# Patient Record
Sex: Male | Born: 1945 | Race: Black or African American | Hispanic: No | Marital: Single | Smoking: Former smoker
Health system: Southern US, Community
[De-identification: ages and names within clinical notes are randomized; demographics above are authoritative.]

## PROBLEM LIST (undated history)

## (undated) DIAGNOSIS — Z720 Tobacco use: Secondary | ICD-10-CM

## (undated) DIAGNOSIS — C679 Malignant neoplasm of bladder, unspecified: Secondary | ICD-10-CM

## (undated) DIAGNOSIS — E119 Type 2 diabetes mellitus without complications: Secondary | ICD-10-CM

---

## 2019-05-16 ENCOUNTER — Encounter (HOSPITAL_COMMUNITY): Payer: Self-pay | Admitting: Emergency Medicine

## 2019-05-16 ENCOUNTER — Inpatient Hospital Stay (HOSPITAL_COMMUNITY)
Admission: EM | Admit: 2019-05-16 | Discharge: 2019-05-20 | DRG: 065 | Disposition: A | Discharge status: Rehabilitation Facility | Payer: No Typology Code available for payment source | Attending: Internal Medicine | Admitting: Internal Medicine

## 2019-05-16 ENCOUNTER — Other Ambulatory Visit: Payer: Self-pay

## 2019-05-16 ENCOUNTER — Emergency Department (HOSPITAL_COMMUNITY): Payer: No Typology Code available for payment source

## 2019-05-16 DIAGNOSIS — I63412 Cerebral infarction due to embolism of left middle cerebral artery: Principal | ICD-10-CM | POA: Diagnosis present

## 2019-05-16 DIAGNOSIS — R29706 NIHSS score 6: Secondary | ICD-10-CM | POA: Diagnosis present

## 2019-05-16 DIAGNOSIS — K5900 Constipation, unspecified: Secondary | ICD-10-CM | POA: Diagnosis not present

## 2019-05-16 DIAGNOSIS — Z720 Tobacco use: Secondary | ICD-10-CM | POA: Diagnosis present

## 2019-05-16 DIAGNOSIS — F1010 Alcohol abuse, uncomplicated: Secondary | ICD-10-CM | POA: Diagnosis present

## 2019-05-16 DIAGNOSIS — R531 Weakness: Secondary | ICD-10-CM | POA: Diagnosis not present

## 2019-05-16 DIAGNOSIS — F1210 Cannabis abuse, uncomplicated: Secondary | ICD-10-CM | POA: Diagnosis present

## 2019-05-16 DIAGNOSIS — E041 Nontoxic single thyroid nodule: Secondary | ICD-10-CM

## 2019-05-16 DIAGNOSIS — Z20822 Contact with and (suspected) exposure to covid-19: Secondary | ICD-10-CM | POA: Diagnosis present

## 2019-05-16 DIAGNOSIS — R471 Dysarthria and anarthria: Secondary | ICD-10-CM | POA: Diagnosis present

## 2019-05-16 DIAGNOSIS — Z833 Family history of diabetes mellitus: Secondary | ICD-10-CM

## 2019-05-16 DIAGNOSIS — Z906 Acquired absence of other parts of urinary tract: Secondary | ICD-10-CM

## 2019-05-16 DIAGNOSIS — E875 Hyperkalemia: Secondary | ICD-10-CM | POA: Diagnosis present

## 2019-05-16 DIAGNOSIS — E119 Type 2 diabetes mellitus without complications: Secondary | ICD-10-CM

## 2019-05-16 DIAGNOSIS — R29810 Facial weakness: Secondary | ICD-10-CM | POA: Diagnosis present

## 2019-05-16 DIAGNOSIS — R4701 Aphasia: Secondary | ICD-10-CM | POA: Diagnosis present

## 2019-05-16 DIAGNOSIS — Z87891 Personal history of nicotine dependence: Secondary | ICD-10-CM

## 2019-05-16 DIAGNOSIS — C679 Malignant neoplasm of bladder, unspecified: Secondary | ICD-10-CM | POA: Diagnosis present

## 2019-05-16 DIAGNOSIS — G8191 Hemiplegia, unspecified affecting right dominant side: Secondary | ICD-10-CM

## 2019-05-16 DIAGNOSIS — R3915 Urgency of urination: Secondary | ICD-10-CM | POA: Diagnosis not present

## 2019-05-16 DIAGNOSIS — I639 Cerebral infarction, unspecified: Secondary | ICD-10-CM

## 2019-05-16 DIAGNOSIS — E785 Hyperlipidemia, unspecified: Secondary | ICD-10-CM

## 2019-05-16 DIAGNOSIS — Z9114 Patient's other noncompliance with medication regimen: Secondary | ICD-10-CM

## 2019-05-16 DIAGNOSIS — Z8551 Personal history of malignant neoplasm of bladder: Secondary | ICD-10-CM

## 2019-05-16 DIAGNOSIS — I10 Essential (primary) hypertension: Secondary | ICD-10-CM | POA: Diagnosis present

## 2019-05-16 HISTORY — DX: Type 2 diabetes mellitus without complications: E11.9

## 2019-05-16 HISTORY — DX: Tobacco use: Z72.0

## 2019-05-16 HISTORY — DX: Cerebral infarction, unspecified: I63.9

## 2019-05-16 HISTORY — DX: Malignant neoplasm of bladder, unspecified: C67.9

## 2019-05-16 NOTE — ED Triage Notes (Signed)
Pt BIB [facility] from home c/o stroke like symptoms.   LKN: unknown. Pt states waking up Sunday morning with these symptoms.   Pt. Presents A&Ox2 to self and place. EMS reports slurred speech, R side droop/weakness.   EMS VS CBG 125 HR 78 NSR BP 150/65 SpO2 99 RA RR 18

## 2019-05-17 ENCOUNTER — Emergency Department (HOSPITAL_COMMUNITY): Payer: No Typology Code available for payment source

## 2019-05-17 ENCOUNTER — Inpatient Hospital Stay (HOSPITAL_COMMUNITY): Payer: No Typology Code available for payment source

## 2019-05-17 ENCOUNTER — Observation Stay (HOSPITAL_COMMUNITY): Payer: No Typology Code available for payment source

## 2019-05-17 ENCOUNTER — Encounter (HOSPITAL_COMMUNITY): Payer: Self-pay | Admitting: Family Medicine

## 2019-05-17 DIAGNOSIS — Z20822 Contact with and (suspected) exposure to covid-19: Secondary | ICD-10-CM | POA: Diagnosis present

## 2019-05-17 DIAGNOSIS — F1010 Alcohol abuse, uncomplicated: Secondary | ICD-10-CM | POA: Diagnosis present

## 2019-05-17 DIAGNOSIS — Z87891 Personal history of nicotine dependence: Secondary | ICD-10-CM | POA: Diagnosis not present

## 2019-05-17 DIAGNOSIS — I10 Essential (primary) hypertension: Secondary | ICD-10-CM | POA: Diagnosis present

## 2019-05-17 DIAGNOSIS — Z720 Tobacco use: Secondary | ICD-10-CM | POA: Diagnosis present

## 2019-05-17 DIAGNOSIS — F1210 Cannabis abuse, uncomplicated: Secondary | ICD-10-CM | POA: Diagnosis present

## 2019-05-17 DIAGNOSIS — C679 Malignant neoplasm of bladder, unspecified: Secondary | ICD-10-CM | POA: Diagnosis not present

## 2019-05-17 DIAGNOSIS — E119 Type 2 diabetes mellitus without complications: Secondary | ICD-10-CM | POA: Diagnosis present

## 2019-05-17 DIAGNOSIS — Z8551 Personal history of malignant neoplasm of bladder: Secondary | ICD-10-CM | POA: Diagnosis not present

## 2019-05-17 DIAGNOSIS — I639 Cerebral infarction, unspecified: Secondary | ICD-10-CM | POA: Diagnosis not present

## 2019-05-17 DIAGNOSIS — I6389 Other cerebral infarction: Secondary | ICD-10-CM | POA: Diagnosis not present

## 2019-05-17 DIAGNOSIS — Z906 Acquired absence of other parts of urinary tract: Secondary | ICD-10-CM | POA: Diagnosis not present

## 2019-05-17 DIAGNOSIS — G8191 Hemiplegia, unspecified affecting right dominant side: Secondary | ICD-10-CM | POA: Diagnosis present

## 2019-05-17 DIAGNOSIS — R531 Weakness: Secondary | ICD-10-CM | POA: Diagnosis present

## 2019-05-17 DIAGNOSIS — I69351 Hemiplegia and hemiparesis following cerebral infarction affecting right dominant side: Secondary | ICD-10-CM | POA: Diagnosis not present

## 2019-05-17 DIAGNOSIS — I69320 Aphasia following cerebral infarction: Secondary | ICD-10-CM | POA: Diagnosis not present

## 2019-05-17 DIAGNOSIS — I63412 Cerebral infarction due to embolism of left middle cerebral artery: Secondary | ICD-10-CM | POA: Diagnosis present

## 2019-05-17 DIAGNOSIS — Z833 Family history of diabetes mellitus: Secondary | ICD-10-CM | POA: Diagnosis not present

## 2019-05-17 DIAGNOSIS — R29706 NIHSS score 6: Secondary | ICD-10-CM | POA: Diagnosis present

## 2019-05-17 DIAGNOSIS — E875 Hyperkalemia: Secondary | ICD-10-CM | POA: Diagnosis present

## 2019-05-17 DIAGNOSIS — R471 Dysarthria and anarthria: Secondary | ICD-10-CM | POA: Diagnosis present

## 2019-05-17 DIAGNOSIS — I69392 Facial weakness following cerebral infarction: Secondary | ICD-10-CM | POA: Diagnosis not present

## 2019-05-17 DIAGNOSIS — R29810 Facial weakness: Secondary | ICD-10-CM | POA: Diagnosis present

## 2019-05-17 DIAGNOSIS — Z9114 Patient's other noncompliance with medication regimen: Secondary | ICD-10-CM | POA: Diagnosis not present

## 2019-05-17 DIAGNOSIS — R001 Bradycardia, unspecified: Secondary | ICD-10-CM | POA: Diagnosis not present

## 2019-05-17 DIAGNOSIS — I69322 Dysarthria following cerebral infarction: Secondary | ICD-10-CM | POA: Diagnosis not present

## 2019-05-17 DIAGNOSIS — I6522 Occlusion and stenosis of left carotid artery: Secondary | ICD-10-CM | POA: Diagnosis not present

## 2019-05-17 DIAGNOSIS — Z23 Encounter for immunization: Secondary | ICD-10-CM | POA: Diagnosis not present

## 2019-05-17 DIAGNOSIS — R3915 Urgency of urination: Secondary | ICD-10-CM | POA: Diagnosis not present

## 2019-05-17 DIAGNOSIS — E041 Nontoxic single thyroid nodule: Secondary | ICD-10-CM | POA: Diagnosis present

## 2019-05-17 DIAGNOSIS — E785 Hyperlipidemia, unspecified: Secondary | ICD-10-CM | POA: Diagnosis present

## 2019-05-17 DIAGNOSIS — S20212D Contusion of left front wall of thorax, subsequent encounter: Secondary | ICD-10-CM | POA: Diagnosis not present

## 2019-05-17 DIAGNOSIS — I63512 Cerebral infarction due to unspecified occlusion or stenosis of left middle cerebral artery: Secondary | ICD-10-CM | POA: Diagnosis not present

## 2019-05-17 DIAGNOSIS — R4701 Aphasia: Secondary | ICD-10-CM | POA: Diagnosis present

## 2019-05-17 DIAGNOSIS — K5900 Constipation, unspecified: Secondary | ICD-10-CM | POA: Diagnosis not present

## 2019-05-17 LAB — COMPREHENSIVE METABOLIC PANEL
ALT: 13 U/L (ref 0–44)
AST: 19 U/L (ref 15–41)
Albumin: 3 g/dL — ABNORMAL LOW (ref 3.5–5.0)
Alkaline Phosphatase: 102 U/L (ref 38–126)
Anion gap: 12 (ref 5–15)
BUN: 18 mg/dL (ref 8–23)
CO2: 26 mmol/L (ref 22–32)
Calcium: 8.8 mg/dL — ABNORMAL LOW (ref 8.9–10.3)
Chloride: 101 mmol/L (ref 98–111)
Creatinine, Ser: 1.25 mg/dL — ABNORMAL HIGH (ref 0.61–1.24)
GFR calc Af Amer: >60 mL/min (ref >60)
GFR calc non Af Amer: 57 mL/min — ABNORMAL LOW (ref >60)
Glucose, Bld: 92 mg/dL (ref 70–99)
Potassium: 3.3 mmol/L — ABNORMAL LOW (ref 3.5–5.1)
Sodium: 139 mmol/L (ref 135–145)
Total Bilirubin: 1.5 mg/dL — ABNORMAL HIGH (ref 0.3–1.2)
Total Protein: 6.2 g/dL — ABNORMAL LOW (ref 6.5–8.1)

## 2019-05-17 LAB — I-STAT CHEM 8, ED
BUN: 31 mg/dL — ABNORMAL HIGH (ref 8–23)
Calcium, Ion: 1.03 mmol/L — ABNORMAL LOW (ref 1.15–1.40)
Chloride: 102 mmol/L (ref 98–111)
Creatinine, Ser: 1.2 mg/dL (ref 0.61–1.24)
Glucose, Bld: 90 mg/dL (ref 70–99)
HCT: 47 % (ref 39.0–52.0)
Hemoglobin: 16 g/dL (ref 13.0–17.0)
Potassium: 6 mmol/L — ABNORMAL HIGH (ref 3.5–5.1)
Sodium: 135 mmol/L (ref 135–145)
TCO2: 29 mmol/L (ref 22–32)

## 2019-05-17 LAB — GLUCOSE, CAPILLARY
Glucose-Capillary: 83 mg/dL (ref 70–99)
Glucose-Capillary: 70 mg/dL (ref 70–99)
Glucose-Capillary: 110 mg/dL — ABNORMAL HIGH (ref 70–99)

## 2019-05-17 LAB — HEMOGLOBIN A1C
Hgb A1c MFr Bld: 5.4 % (ref 4.8–5.6)
Mean Plasma Glucose: 108.28 mg/dL

## 2019-05-17 LAB — NA AND K (SODIUM & POTASSIUM), RAND UR
Potassium Urine: 36 mmol/L
Sodium, Ur: 55 mmol/L

## 2019-05-17 LAB — CBC
HCT: 46.8 % (ref 39.0–52.0)
Hemoglobin: 15 g/dL (ref 13.0–17.0)
MCH: 27.4 pg (ref 26.0–34.0)
MCHC: 32.1 g/dL (ref 30.0–36.0)
MCV: 85.4 fL (ref 80.0–100.0)
Platelets: 251 10*3/uL (ref 150–400)
RBC: 5.48 MIL/uL (ref 4.22–5.81)
RDW: 14.2 % (ref 11.5–15.5)
WBC: 13.7 10*3/uL — ABNORMAL HIGH (ref 4.0–10.5)
nRBC: 0 % (ref 0.0–0.2)

## 2019-05-17 LAB — SARS CORONAVIRUS 2 (TAT 6-24 HRS): SARS Coronavirus 2: NEGATIVE

## 2019-05-17 LAB — PROTIME-INR
INR: 1.1 (ref 0.8–1.2)
Prothrombin Time: 13.6 seconds (ref 11.4–15.2)

## 2019-05-17 LAB — URINALYSIS, ROUTINE W REFLEX MICROSCOPIC
Bilirubin Urine: NEGATIVE
Glucose, UA: NEGATIVE mg/dL
Hgb urine dipstick: NEGATIVE
Ketones, ur: 5 mg/dL — AB
Leukocytes,Ua: NEGATIVE
Nitrite: NEGATIVE
Protein, ur: NEGATIVE mg/dL
Specific Gravity, Urine: 1.025 (ref 1.005–1.030)
pH: 5 (ref 5.0–8.0)

## 2019-05-17 LAB — DIFFERENTIAL
Abs Immature Granulocytes: 0.03 10*3/uL (ref 0.00–0.07)
Basophils Absolute: 0.1 10*3/uL (ref 0.0–0.1)
Basophils Relative: 0 %
Eosinophils Absolute: 0.2 10*3/uL (ref 0.0–0.5)
Eosinophils Relative: 1 %
Immature Granulocytes: 0 %
Lymphocytes Relative: 23 %
Lymphs Abs: 3.1 10*3/uL (ref 0.7–4.0)
Monocytes Absolute: 1 10*3/uL (ref 0.1–1.0)
Monocytes Relative: 7 %
Neutro Abs: 9.3 10*3/uL — ABNORMAL HIGH (ref 1.7–7.7)
Neutrophils Relative %: 69 %

## 2019-05-17 LAB — RAPID URINE DRUG SCREEN, HOSP PERFORMED
Amphetamines: NOT DETECTED
Barbiturates: NOT DETECTED
Benzodiazepines: NOT DETECTED
Cocaine: NOT DETECTED
Opiates: NOT DETECTED
Tetrahydrocannabinol: POSITIVE — AB

## 2019-05-17 LAB — TROPONIN I (HIGH SENSITIVITY)
Troponin I (High Sensitivity): 4 ng/L (ref <18)
Troponin I (High Sensitivity): 5 ng/L (ref <18)

## 2019-05-17 LAB — POTASSIUM
Potassium: 3.5 mmol/L (ref 3.5–5.1)
Potassium: 3.5 mmol/L (ref 3.5–5.1)

## 2019-05-17 LAB — LIPID PANEL
Cholesterol: 136 mg/dL (ref 0–200)
HDL: 29 mg/dL — ABNORMAL LOW (ref >40)
LDL Cholesterol: 85 mg/dL (ref 0–99)
Total CHOL/HDL Ratio: 4.7 RATIO
Triglycerides: 110 mg/dL (ref <150)
VLDL: 22 mg/dL (ref 0–40)

## 2019-05-17 LAB — ECHOCARDIOGRAM COMPLETE
Height: 75 in
Weight: 2592 oz

## 2019-05-17 LAB — ETHANOL: Alcohol, Ethyl (B): <10 mg/dL (ref <10)

## 2019-05-17 LAB — APTT: aPTT: 23 seconds — ABNORMAL LOW (ref 24–36)

## 2019-05-17 MED ORDER — ACETAMINOPHEN 650 MG RE SUPP: 650.0000 mg | RECTAL | Status: DC | PRN

## 2019-05-17 MED ORDER — INSULIN ASPART 100 UNIT/ML ~~LOC~~ SOLN: 0.0000 [IU] | Freq: Every day | SUBCUTANEOUS | Status: DC

## 2019-05-17 MED ORDER — ASPIRIN 325 MG PO TABS
325.0000 mg | ORAL_TABLET | Freq: Every day | ORAL | Status: DC
  Administered 2019-05-17: 325 mg via ORAL
  Filled 2019-05-17: qty 1

## 2019-05-17 MED ORDER — GADOBUTROL 1 MMOL/ML IV SOLN
7.2000 mL | Freq: Once | INTRAVENOUS | Status: AC | PRN
  Administered 2019-05-17: 7.2 mL via INTRAVENOUS

## 2019-05-17 MED ORDER — ASPIRIN EC 325 MG PO TBEC
325.0000 mg | DELAYED_RELEASE_TABLET | Freq: Every day | ORAL | Status: DC
  Administered 2019-05-18 – 2019-05-20 (×3): 325 mg via ORAL
  Filled 2019-05-17 (×3): qty 1

## 2019-05-17 MED ORDER — SODIUM CHLORIDE 0.9 % IV SOLN: INTRAVENOUS | Status: DC

## 2019-05-17 MED ORDER — ACETAMINOPHEN 160 MG/5ML PO SOLN: 650.0000 mg | ORAL | Status: DC | PRN

## 2019-05-17 MED ORDER — IOHEXOL 350 MG/ML SOLN
75.0000 mL | Freq: Once | INTRAVENOUS | Status: AC | PRN
  Administered 2019-05-17: 75 mL via INTRAVENOUS

## 2019-05-17 MED ORDER — ENOXAPARIN SODIUM 40 MG/0.4ML ~~LOC~~ SOLN
40.0000 mg | Freq: Every day | SUBCUTANEOUS | Status: DC
  Administered 2019-05-17 – 2019-05-20 (×4): 40 mg via SUBCUTANEOUS
  Filled 2019-05-17 (×4): qty 0.4

## 2019-05-17 MED ORDER — CLOPIDOGREL BISULFATE 75 MG PO TABS
75.0000 mg | ORAL_TABLET | Freq: Every day | ORAL | Status: DC
  Administered 2019-05-17 – 2019-05-20 (×4): 75 mg via ORAL
  Filled 2019-05-17 (×4): qty 1

## 2019-05-17 MED ORDER — SENNOSIDES-DOCUSATE SODIUM 8.6-50 MG PO TABS: 1.0000 | ORAL_TABLET | Freq: Every evening | ORAL | Status: DC | PRN

## 2019-05-17 MED ORDER — ENSURE ENLIVE PO LIQD
237.0000 mL | Freq: Two times a day (BID) | ORAL | Status: DC
  Administered 2019-05-17 – 2019-05-20 (×4): 237 mL via ORAL

## 2019-05-17 MED ORDER — ATORVASTATIN CALCIUM 80 MG PO TABS: 80.0000 mg | ORAL_TABLET | Freq: Every day | ORAL | Status: DC

## 2019-05-17 MED ORDER — STROKE: EARLY STAGES OF RECOVERY BOOK
Freq: Once | Status: AC
  Filled 2019-05-17: qty 1

## 2019-05-17 MED ORDER — SODIUM CHLORIDE 0.9 % IV BOLUS
500.0000 mL | Freq: Once | INTRAVENOUS | Status: AC
  Administered 2019-05-17: 500 mL via INTRAVENOUS

## 2019-05-17 MED ORDER — INSULIN ASPART 100 UNIT/ML ~~LOC~~ SOLN: 0.0000 [IU] | Freq: Three times a day (TID) | SUBCUTANEOUS | Status: DC

## 2019-05-17 MED ORDER — ENOXAPARIN SODIUM 40 MG/0.4ML ~~LOC~~ SOLN: 40.0000 mg | Freq: Every day | SUBCUTANEOUS | Status: DC

## 2019-05-17 MED ORDER — ACETAMINOPHEN 325 MG PO TABS
650.0000 mg | ORAL_TABLET | ORAL | Status: DC | PRN
  Filled 2019-05-17: qty 2

## 2019-05-17 MED ORDER — ATORVASTATIN CALCIUM 40 MG PO TABS
40.0000 mg | ORAL_TABLET | Freq: Every day | ORAL | Status: DC
  Administered 2019-05-17 – 2019-05-19 (×3): 40 mg via ORAL
  Filled 2019-05-17 (×3): qty 1

## 2019-05-17 MED ORDER — ASPIRIN 300 MG RE SUPP
300.0000 mg | Freq: Every day | RECTAL | Status: DC
  Filled 2019-05-17: qty 1

## 2019-05-17 NOTE — ED Notes (Signed)
Tele Tatsuya Marana daughter [id number] looking for an update on the patient

## 2019-05-17 NOTE — ED Provider Notes (Signed)
Emergency Department Provider Note   I have reviewed the triage vital signs and the nursing notes.   HISTORY  Chief Complaint No chief complaint on file.   HPI Ruben Moore is a 74 y.o. male with unknown past medical history who presents to the emergency department today with neurologic change.  Patient states that he was last normal as far as walking and using his arms Sunday morning.  He stated sometime since that time he lost use of his right arm and began having difficulty speaking and moving around the house.  It took him over 30 hours to finally get to somewhere where he could call for help.  EMS arrived he had a obvious right facial droop and right arm weakness with dysarthria.  Secondary to be in greater than 24 hours since last known normal no code stroke was activated was brought here for further evaluation.  Patient states he has medical problems takes for 5 medications but does not know what they are for what they are called.  No history of strokes as far as he knows of.  No headache or chest pain at this time.  No other associated symptoms.  No recent illnesses. No trauma.   No other associated or modifying symptoms.    Past Medical History:  Diagnosis Date  . Diabetes mellitus without complication [facility])     Patient Active Problem List   Diagnosis Date Noted  . Acute ischemic stroke ([place]) 05/17/2019  . Hyperkalemia 05/17/2019  . Diabetes mellitus type II, non insulin dependent ([place]) 05/17/2019    History reviewed. No pertinent surgical history.    Allergies Patient has no known allergies.  Family History  Problem Relation Age of Onset  . Diabetes Brother     Social History Social History   Tobacco Use  . Smoking status: Former Research scientist (life sciences)  . Smokeless tobacco: Former Network engineer Use Topics  . Alcohol use: Not Currently  . Drug use: Yes    Types: Marijuana    Review of Systems  All other systems negative except as documented in the HPI. All  pertinent positives and negatives as reviewed in the HPI. ____________________________________________   PHYSICAL EXAM:  VITAL SIGNS: ED Triage Vitals  Enc Vitals Group     BP 05/16/19 2345 (!) 138/94     Pulse Rate 05/16/19 2345 81     Resp 05/16/19 2345 18     Temp 05/16/19 2346 98 F (36.7 C)     Temp Source 05/16/19 2346 Oral     SpO2 05/16/19 2345 97 %     Weight 05/16/19 2346 162 lb (73.5 kg)     Height 05/16/19 2346 6\' 3"  (1.905 m)    Constitutional: Alert and oriented. Well appearing and in no acute distress. Eyes: Conjunctivae are normal. PERRL. EOMI. Head: Atraumatic. Nose: No congestion/rhinnorhea. Mouth/Throat: Mucous membranes are moist.  Oropharynx non-erythematous. Neck: No stridor.  No meningeal signs.   Cardiovascular: Normal rate, regular rhythm. Good peripheral circulation. Grossly normal heart sounds.   Respiratory: Normal respiratory effort.  No retractions. Lungs CTAB. Gastrointestinal: Soft and nontender. No distention.  Musculoskeletal: No lower extremity tenderness nor edema. No gross deformities of extremities. Neurologic: Patient with garbled speech.  Tongue protrudes straight.  Pupils are equal round reactive to light and extraocular movements are intact.  He has slight strabismus.  Eyebrows raise symmetrically.  When he smiles he does have a right facial droop.  His right grip strength and shoulder shrug weakness.  Sensations intact  in bilateral upper extremities.  Sensation to light touch is intact in bilateral lower extremities.  His strength seems to be equal in lower extremities. Skin:  Skin is warm, dry and intact. No rash noted.   ____________________________________________   LABS (all labs ordered are listed, but only abnormal results are displayed)  Labs Reviewed  CBC - Abnormal; Notable for the following components:      Result Value   WBC 13.7 (*)    All other components within normal limits  DIFFERENTIAL - Abnormal; Notable for the  following components:   Neutro Abs 9.3 (*)    All other components within normal limits  RAPID URINE DRUG SCREEN, HOSP PERFORMED - Abnormal; Notable for the following components:   Tetrahydrocannabinol POSITIVE (*)    All other components within normal limits  URINALYSIS, ROUTINE W REFLEX MICROSCOPIC - Abnormal; Notable for the following components:   Ketones, ur 5 (*)    All other components within normal limits  APTT - Abnormal; Notable for the following components:   aPTT 23 (*)    All other components within normal limits  I-STAT CHEM 8, ED - Abnormal; Notable for the following components:   Potassium 6.0 (*)    BUN 31 (*)    Calcium, Ion 1.03 (*)    All other components within normal limits  SARS CORONAVIRUS 2 (TAT 6-24 HRS)  PROTIME-INR  NA AND K (SODIUM & POTASSIUM), RAND UR  ETHANOL  COMPREHENSIVE METABOLIC PANEL  HEMOGLOBIN A1C  LIPID PANEL  POTASSIUM  POTASSIUM  POTASSIUM  POTASSIUM  POTASSIUM  TROPONIN I (HIGH SENSITIVITY)  TROPONIN I (HIGH SENSITIVITY)   ____________________________________________  EKG   EKG Interpretation  Date/Time:  Monday May 16 2019 23:42:23 EST Ventricular Rate:  75 PR Interval:    QRS Duration: 83 QT Interval:  356 QTC Calculation: 398 R Axis:   59 Text Interpretation: Sinus rhythm Borderline ST elevation, anterior leads No old tracing to compare Confirmed by Merrily Pew [phone]) on 05/17/2019 12:26:10 AM       ____________________________________________  RADIOLOGY  CT HEAD WO CONTRAST  Result Date: 05/17/2019 CLINICAL DATA:  Dizziness, confusion, blurred vision EXAM: CT HEAD WITHOUT CONTRAST TECHNIQUE: Contiguous axial images were obtained from the base of the skull through the vertex without intravenous contrast. COMPARISON:  None. FINDINGS: Brain: There is low-density noted in the left insular cortex, extending into the left basal ganglia and corona radiata compatible with acute to subacute infarct. No hemorrhage. No  midline shift. No hydrocephalus. Vascular: No hyperdense vessel or unexpected calcification. Skull: No acute calvarial abnormality. Sinuses/Orbits: Visualized paranasal sinuses and mastoids clear. Orbital soft tissues unremarkable. Other: None IMPRESSION: Infarct within the left insular cortex, left basal ganglia and corona radiata, likely acute to subacute. Electronically Signed   By: Rolm Baptise M.D.   On: 05/17/2019 00:23   MR ANGIO HEAD WO CONTRAST  Result Date: 05/17/2019 CLINICAL DATA:  Initial evaluation for acute stroke, right-sided weakness, dysarthria. EXAM: MRI HEAD WITHOUT CONTRAST MRA HEAD WITHOUT CONTRAST MRA NECK WITHOUT AND WITH CONTRAST TECHNIQUE: Multiplanar, multiecho pulse sequences of the brain and surrounding structures were obtained without intravenous contrast. Angiographic images of the Circle of Willis were obtained using MRA technique without intravenous contrast. Angiographic images of the neck were obtained using MRA technique without and with intravenous contrast. Carotid stenosis measurements (when applicable) are obtained utilizing NASCET criteria, using the distal internal carotid diameter as the denominator. CONTRAST:  7.33mL GADAVIST GADOBUTROL 1 MMOL/ML IV SOLN COMPARISON:  Prior head  CT from earlier the same day. FINDINGS: MRI HEAD FINDINGS Brain: Examination mildly degraded by motion artifact. Diffuse prominence of the CSF containing spaces compatible with generalized age-related cerebral atrophy. Patchy T2/FLAIR hyperintensity within the periventricular deep white matter both cerebral hemispheres most consistent with chronic small vessel ischemic disease, mild for age. Multifocal areas of restricted diffusion involving the left insular cortex, left basal ganglia, with a few additional patchy cortical infarcts within the overlying left frontal and temporal lobes, consistent with left MCA territory infarcts. These are acute to early subacute in appearance with associated  T2/FLAIR signal abnormality. Associated petechial hemorrhage at the level of the left lentiform nucleus without frank hemorrhagic transformation. Diffusion abnormality/edema extending into the left cerebral peduncle and midbrain indicative of involvement of the underlying corticospinal tract. No significant mass effect. No other evidence for acute or subacute ischemia. Gray-white matter differentiation otherwise maintained. No encephalomalacia to suggest chronic cortical infarction elsewhere within the brain. No other foci of susceptibility artifact to suggest acute or chronic intracranial hemorrhage. No mass lesion, midline shift or mass effect. No hydrocephalus. No extra-axial fluid collection. Pituitary gland within normal limits. Midline structures intact. Vascular: Loss of normal flow void within the left ICA to the level of the terminus, which could be related to slow flow and/or occlusion. Major intracranial vascular flow voids otherwise maintained. Skull and upper cervical spine: Craniocervical junction within normal limits. Bone marrow signal intensity normal. No scalp soft tissue abnormality. Sinuses/Orbits: Globes and orbital soft tissues within normal limits. Mild scattered mucosal thickening noted within the ethmoidal sinuses. Small left maxillary sinus retention cyst noted. No significant mastoid effusion. Inner ear structures grossly normal. Other: None. MRA HEAD FINDINGS ANTERIOR CIRCULATION: Examination moderately degraded by motion artifact. 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 Distal cervical segment of the right ICA widely patent with antegrade flow. Petrous, cavernous, and supraclinoid segments demonstrate atheromatous irregularity but are patent without definite flow-limiting stenosis. Both A1 segments widely patent. Normal anterior communicating artery. Anterior cerebral arteries patent to their distal aspects without stenosis. Right M1 widely patent. Perfusion of the left M1 via  collateral flow across the circle-of-Willis and/or from the posterior circulation. Left M1 widely patent as well. Grossly negative MCA bifurcations. Distal MCA branches well perfused. Right MCA branches somewhat attenuated as compared to the contralateral left. POSTERIOR CIRCULATION: Vertebral arteries widely patent to the vertebrobasilar junction without stenosis. Right vertebral artery dominant. Posterior inferior cerebral arteries patent bilaterally. Basilar widely patent to its distal aspect without stenosis. Superior cerebral arteries patent bilaterally. PCA supplied via the basilar as well as robust bilateral posterior communicating arteries. Both PCAs well perfused to their distal aspects without stenosis. No intracranial aneurysm. MRA NECK FINDINGS AORTIC ARCH: Visualized aortic arch of normal caliber with normal 3 vessel morphology. No hemodynamically significant stenosis seen about the origin of the great vessels. Visualized subclavian arteries widely patent. RIGHT CAROTID SYSTEM: Right common carotid artery widely patent from its origin to the bifurcation without stenosis. No significant atheromatous narrowing about the right bifurcation. Right ICA widely patent distally to the skull base without stenosis or vascular occlusion. LEFT CAROTID SYSTEM: Left common carotid artery widely patent from its origin to the bifurcation. 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 Left ICA remains occluded to the circle-of-Willis. VERTEBRAL ARTERIES: Both vertebral arteries arise from the subclavian arteries. Right vertebral artery dominant and widely patent to the skull base. Multifocal moderate to severe stenoses seen involving the proximal  left V1 segment, likely atherosclerotic (series 1046, image 13). Left vertebral artery otherwise widely patent distally without stenosis or vascular occlusion. Incidental note made of a 3 cm right thyroid nodule, indeterminate. IMPRESSION: MRI  HEAD IMPRESSION: 1. Patchy multifocal acute to early subacute ischemic left MCA territory infarcts involving the left insular cortex, left basal ganglia, and overlying left frontal and temporal lobes as above. No significant mass effect. 2. Associated petechial hemorrhage at the left lentiform nucleus without frank hemorrhagic transformation. 3. Abnormal flow void within the left ICA to the level of the terminus, consistent with occlusion. See below. 4. Underlying mild chronic microvascular ischemic disease. MRA HEAD IMPRESSION: 1. Motion degraded exam. 2. 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 Left MCA and its branches are perfused via collateral flow across the circle-of-Willis and/or from the posterior circulation. 3. Otherwise grossly negative MRA of the head. No other large vessel occlusion. No hemodynamically significant or correctable stenosis identified. MRA NECK IMPRESSION: 1. 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 2. Wide patency of the right carotid artery system within the neck. 3. 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 Left vertebral artery otherwise widely patent as is the dominant right vertebral artery. 4. Approximate 3 cm right thyroid nodule, indeterminate. Follow-up examination with dedicated thyroid ultrasound recommended for further evaluation. This could be performed on a nonemergent outpatient basis. Electronically Signed   By: Jeannine Boga M.D.   On: 05/17/2019 04:27   MR ANGIO NECK W WO CONTRAST  Result Date: 05/17/2019 CLINICAL DATA:  Initial evaluation for acute stroke, right-sided weakness, dysarthria. EXAM: MRI HEAD WITHOUT CONTRAST MRA HEAD WITHOUT CONTRAST MRA NECK WITHOUT AND WITH CONTRAST TECHNIQUE: Multiplanar, multiecho pulse sequences of the brain and surrounding structures were obtained without intravenous contrast. Angiographic images of the Circle of Willis were obtained using MRA  technique without intravenous contrast. Angiographic images of the neck were obtained using MRA technique without and with intravenous contrast. Carotid stenosis measurements (when applicable) are obtained utilizing NASCET criteria, using the distal internal carotid diameter as the denominator. CONTRAST:  7.56mL GADAVIST GADOBUTROL 1 MMOL/ML IV SOLN COMPARISON:  Prior head CT from earlier the same day. FINDINGS: MRI HEAD FINDINGS Brain: Examination mildly degraded by motion artifact. Diffuse prominence of the CSF containing spaces compatible with generalized age-related cerebral atrophy. Patchy T2/FLAIR hyperintensity within the periventricular deep white matter both cerebral hemispheres most consistent with chronic small vessel ischemic disease, mild for age. Multifocal areas of restricted diffusion involving the left insular cortex, left basal ganglia, with a few additional patchy cortical infarcts within the overlying left frontal and temporal lobes, consistent with left MCA territory infarcts. These are acute to early subacute in appearance with associated T2/FLAIR signal abnormality. Associated petechial hemorrhage at the level of the left lentiform nucleus without frank hemorrhagic transformation. Diffusion abnormality/edema extending into the left cerebral peduncle and midbrain indicative of involvement of the underlying corticospinal tract. No significant mass effect. No other evidence for acute or subacute ischemia. Gray-white matter differentiation otherwise maintained. No encephalomalacia to suggest chronic cortical infarction elsewhere within the brain. No other foci of susceptibility artifact to suggest acute or chronic intracranial hemorrhage. No mass lesion, midline shift or mass effect. No hydrocephalus. No extra-axial fluid collection. Pituitary gland within normal limits. Midline structures intact. Vascular: Loss of normal flow void within the left ICA to the level of the terminus, which could be  related to slow flow and/or occlusion. Major  intracranial vascular flow voids otherwise maintained. Skull and upper cervical spine: Craniocervical junction within normal limits. Bone marrow signal intensity normal. No scalp soft tissue abnormality. Sinuses/Orbits: Globes and orbital soft tissues within normal limits. Mild scattered mucosal thickening noted within the ethmoidal sinuses. Small left maxillary sinus retention cyst noted. No significant mastoid effusion. Inner ear structures grossly normal. Other: None. MRA HEAD FINDINGS ANTERIOR CIRCULATION: Examination moderately degraded by motion artifact. 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 Distal cervical segment of the right ICA widely patent with antegrade flow. Petrous, cavernous, and supraclinoid segments demonstrate atheromatous irregularity but are patent without definite flow-limiting stenosis. Both A1 segments widely patent. Normal anterior communicating artery. Anterior cerebral arteries patent to their distal aspects without stenosis. Right M1 widely patent. Perfusion of the left M1 via collateral flow across the circle-of-Willis and/or from the posterior circulation. Left M1 widely patent as well. Grossly negative MCA bifurcations. Distal MCA branches well perfused. Right MCA branches somewhat attenuated as compared to the contralateral left. POSTERIOR CIRCULATION: Vertebral arteries widely patent to the vertebrobasilar junction without stenosis. Right vertebral artery dominant. Posterior inferior cerebral arteries patent bilaterally. Basilar widely patent to its distal aspect without stenosis. Superior cerebral arteries patent bilaterally. PCA supplied via the basilar as well as robust bilateral posterior communicating arteries. Both PCAs well perfused to their distal aspects without stenosis. No intracranial aneurysm. MRA NECK FINDINGS AORTIC ARCH: Visualized aortic arch of normal caliber with normal 3 vessel morphology. No  hemodynamically significant stenosis seen about the origin of the great vessels. Visualized subclavian arteries widely patent. RIGHT CAROTID SYSTEM: Right common carotid artery widely patent from its origin to the bifurcation without stenosis. No significant atheromatous narrowing about the right bifurcation. Right ICA widely patent distally to the skull base without stenosis or vascular occlusion. LEFT CAROTID SYSTEM: Left common carotid artery widely patent from its origin to the bifurcation. 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 Left ICA remains occluded to the circle-of-Willis. VERTEBRAL ARTERIES: Both vertebral arteries arise from the subclavian arteries. Right vertebral artery dominant and widely patent to the skull base. Multifocal moderate to severe stenoses seen involving the proximal left V1 segment, likely atherosclerotic (series 1046, image 13). Left vertebral artery otherwise widely patent distally without stenosis or vascular occlusion. Incidental note made of a 3 cm right thyroid nodule, indeterminate. IMPRESSION: MRI HEAD IMPRESSION: 1. Patchy multifocal acute to early subacute ischemic left MCA territory infarcts involving the left insular cortex, left basal ganglia, and overlying left frontal and temporal lobes as above. No significant mass effect. 2. Associated petechial hemorrhage at the left lentiform nucleus without frank hemorrhagic transformation. 3. Abnormal flow void within the left ICA to the level of the terminus, consistent with occlusion. See below. 4. Underlying mild chronic microvascular ischemic disease. MRA HEAD IMPRESSION: 1. Motion degraded exam. 2. 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 Left MCA and its branches are perfused via collateral flow across the circle-of-Willis and/or from the posterior circulation. 3. Otherwise grossly negative MRA of the head. No other large vessel occlusion. No hemodynamically significant  or correctable stenosis identified. MRA NECK IMPRESSION: 1. 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 2. Wide patency of the right carotid artery system within the neck. 3. 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 Left vertebral artery otherwise widely patent as is the dominant right vertebral artery. 4. Approximate 3 cm  right thyroid nodule, indeterminate. Follow-up examination with dedicated thyroid ultrasound recommended for further evaluation. This could be performed on a nonemergent outpatient basis. Electronically Signed   By: Jeannine Boga M.D.   On: 05/17/2019 04:27   MR BRAIN WO CONTRAST  Result Date: 05/17/2019 CLINICAL DATA:  Initial evaluation for acute stroke, right-sided weakness, dysarthria. EXAM: MRI HEAD WITHOUT CONTRAST MRA HEAD WITHOUT CONTRAST MRA NECK WITHOUT AND WITH CONTRAST TECHNIQUE: Multiplanar, multiecho pulse sequences of the brain and surrounding structures were obtained without intravenous contrast. Angiographic images of the Circle of Willis were obtained using MRA technique without intravenous contrast. Angiographic images of the neck were obtained using MRA technique without and with intravenous contrast. Carotid stenosis measurements (when applicable) are obtained utilizing NASCET criteria, using the distal internal carotid diameter as the denominator. CONTRAST:  7.34mL GADAVIST GADOBUTROL 1 MMOL/ML IV SOLN COMPARISON:  Prior head CT from earlier the same day. FINDINGS: MRI HEAD FINDINGS Brain: Examination mildly degraded by motion artifact. Diffuse prominence of the CSF containing spaces compatible with generalized age-related cerebral atrophy. Patchy T2/FLAIR hyperintensity within the periventricular deep white matter both cerebral hemispheres most consistent with chronic small vessel ischemic disease, mild for age. Multifocal areas of restricted diffusion involving the left insular cortex, left basal ganglia, with  a few additional patchy cortical infarcts within the overlying left frontal and temporal lobes, consistent with left MCA territory infarcts. These are acute to early subacute in appearance with associated T2/FLAIR signal abnormality. Associated petechial hemorrhage at the level of the left lentiform nucleus without frank hemorrhagic transformation. Diffusion abnormality/edema extending into the left cerebral peduncle and midbrain indicative of involvement of the underlying corticospinal tract. No significant mass effect. No other evidence for acute or subacute ischemia. Gray-white matter differentiation otherwise maintained. No encephalomalacia to suggest chronic cortical infarction elsewhere within the brain. No other foci of susceptibility artifact to suggest acute or chronic intracranial hemorrhage. No mass lesion, midline shift or mass effect. No hydrocephalus. No extra-axial fluid collection. Pituitary gland within normal limits. Midline structures intact. Vascular: Loss of normal flow void within the left ICA to the level of the terminus, which could be related to slow flow and/or occlusion. Major intracranial vascular flow voids otherwise maintained. Skull and upper cervical spine: Craniocervical junction within normal limits. Bone marrow signal intensity normal. No scalp soft tissue abnormality. Sinuses/Orbits: Globes and orbital soft tissues within normal limits. Mild scattered mucosal thickening noted within the ethmoidal sinuses. Small left maxillary sinus retention cyst noted. No significant mastoid effusion. Inner ear structures grossly normal. Other: None. MRA HEAD FINDINGS ANTERIOR CIRCULATION: Examination moderately degraded by motion artifact. 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 Distal cervical segment of the right ICA widely patent with antegrade flow. Petrous, cavernous, and supraclinoid segments demonstrate atheromatous irregularity but are patent without definite flow-limiting  stenosis. Both A1 segments widely patent. Normal anterior communicating artery. Anterior cerebral arteries patent to their distal aspects without stenosis. Right M1 widely patent. Perfusion of the left M1 via collateral flow across the circle-of-Willis and/or from the posterior circulation. Left M1 widely patent as well. Grossly negative MCA bifurcations. Distal MCA branches well perfused. Right MCA branches somewhat attenuated as compared to the contralateral left. POSTERIOR CIRCULATION: Vertebral arteries widely patent to the vertebrobasilar junction without stenosis. Right vertebral artery dominant. Posterior inferior cerebral arteries patent bilaterally. Basilar widely patent to its distal aspect without stenosis. Superior cerebral arteries patent bilaterally. PCA supplied via the basilar as well as robust bilateral posterior communicating arteries. Both  PCAs well perfused to their distal aspects without stenosis. No intracranial aneurysm. MRA NECK FINDINGS AORTIC ARCH: Visualized aortic arch of normal caliber with normal 3 vessel morphology. No hemodynamically significant stenosis seen about the origin of the great vessels. Visualized subclavian arteries widely patent. RIGHT CAROTID SYSTEM: Right common carotid artery widely patent from its origin to the bifurcation without stenosis. No significant atheromatous narrowing about the right bifurcation. Right ICA widely patent distally to the skull base without stenosis or vascular occlusion. LEFT CAROTID SYSTEM: Left common carotid artery widely patent from its origin to the bifurcation. 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 Left ICA remains occluded to the circle-of-Willis. VERTEBRAL ARTERIES: Both vertebral arteries arise from the subclavian arteries. Right vertebral artery dominant and widely patent to the skull base. Multifocal moderate to severe stenoses seen involving the proximal left V1 segment, likely  atherosclerotic (series 1046, image 13). Left vertebral artery otherwise widely patent distally without stenosis or vascular occlusion. Incidental note made of a 3 cm right thyroid nodule, indeterminate. IMPRESSION: MRI HEAD IMPRESSION: 1. Patchy multifocal acute to early subacute ischemic left MCA territory infarcts involving the left insular cortex, left basal ganglia, and overlying left frontal and temporal lobes as above. No significant mass effect. 2. Associated petechial hemorrhage at the left lentiform nucleus without frank hemorrhagic transformation. 3. Abnormal flow void within the left ICA to the level of the terminus, consistent with occlusion. See below. 4. Underlying mild chronic microvascular ischemic disease. MRA HEAD IMPRESSION: 1. Motion degraded exam. 2. 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 Left MCA and its branches are perfused via collateral flow across the circle-of-Willis and/or from the posterior circulation. 3. Otherwise grossly negative MRA of the head. No other large vessel occlusion. No hemodynamically significant or correctable stenosis identified. MRA NECK IMPRESSION: 1. 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 2. Wide patency of the right carotid artery system within the neck. 3. 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 Left vertebral artery otherwise widely patent as is the dominant right vertebral artery. 4. Approximate 3 cm right thyroid nodule, indeterminate. Follow-up examination with dedicated thyroid ultrasound recommended for further evaluation. This could be performed on a nonemergent outpatient basis. Electronically Signed   By: Jeannine Boga M.D.   On: 05/17/2019 04:27   DG Chest Portable 1 View  Result Date: 05/17/2019 CLINICAL DATA:  Slurred speech, right-sided weakness EXAM: PORTABLE CHEST 1 VIEW COMPARISON:  None. FINDINGS: Mild hyperinflation of the lungs. Heart and mediastinal  contours are within normal limits. No focal opacities or effusions. No acute bony abnormality. IMPRESSION: No active disease. Electronically Signed   By: Rolm Baptise M.D.   On: 05/17/2019 00:08    ____________________________________________   PROCEDURES  Procedure(s) performed:   Procedures   ____________________________________________   INITIAL IMPRESSION / ASSESSMENT AND PLAN / ED COURSE  Patiently certainly appears to have had a stroke likely on Sunday sometime.  He is well outside the window for any acute interventions for stroke so no code stroke was called and TPA was not given.  Will work-up and discussed with neurology and admit to hospital.  Ct scan with infarct in area that would explain symptoms. Discussed with Dr. Myna Hidalgo for admission. D/w Dr. Lorraine Lax who will see patient in consultation.   Pertinent labs & imaging results that were available during my care of the patient were reviewed by me  and considered in my medical decision making (see chart for details).  ____________________________________________  FINAL CLINICAL IMPRESSION(S) / ED DIAGNOSES  Final diagnoses:  Cerebrovascular accident (CVA), unspecified mechanism ([place])     MEDICATIONS GIVEN DURING THIS VISIT:  Medications   stroke: mapping our early stages of recovery book (has no administration in time range)  0.9 %  sodium chloride infusion ( Intravenous New Bag/Given 05/17/19 0213)  acetaminophen (TYLENOL) tablet 650 mg (has no administration in time range)    Or  acetaminophen (TYLENOL) 160 MG/5ML solution 650 mg (has no administration in time range)    Or  acetaminophen (TYLENOL) suppository 650 mg (has no administration in time range)  senna-docusate (Senokot-S) tablet 1 tablet (has no administration in time range)  aspirin suppository 300 mg (has no administration in time range)    Or  aspirin tablet 325 mg (has no administration in time range)  insulin aspart (novoLOG) injection 0-9 Units (has  no administration in time range)  insulin aspart (novoLOG) injection 0-5 Units (0 Units Subcutaneous Not Given 05/17/19 0517)  feeding supplement (ENSURE ENLIVE) (ENSURE ENLIVE) liquid 237 mL (has no administration in time range)  enoxaparin (LOVENOX) injection 40 mg (has no administration in time range)  sodium chloride 0.9 % bolus 500 mL (500 mLs Intravenous New Bag/Given 05/17/19 0517)  gadobutrol (GADAVIST) 1 MMOL/ML injection 7.2 mL (7.2 mLs Intravenous Contrast Given 05/17/19 0346)     NEW OUTPATIENT MEDICATIONS STARTED DURING THIS VISIT:  There are no discharge medications for this patient.   Note:  This note was prepared with assistance of Dragon voice recognition software. Occasional wrong-word or sound-a-like substitutions may have occurred due to the inherent limitations of voice recognition software.   Brooksie Ellwanger, Corene Cornea, MD 05/17/19 [phone]

## 2019-05-17 NOTE — Progress Notes (Signed)
STROKE TEAM PROGRESS NOTE   INTERVAL HISTORY Pt sitting in chair, awake alert. Still has right facial droop and right hemiparesis and mild dysarthria. He is veteran and his medical records not available from [place]. 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 However, he can not tell me what meds he is on, not ASA but not sure if it is plavix.   Vitals:   05/17/19 0130 05/17/19 0215 05/17/19 0430 05/17/19 0737  BP: (!) 149/63 (!) 133/93 (!) 149/68 (!) 113/50  Pulse: 71 74 77 70  Resp: (!) 22 15  16   Temp:   98.8 F (37.1 C) 98.6 F (37 C)  TempSrc:      SpO2: 95% 97% 100% 100%  Weight:      Height:        CBC:  Recent Labs  Lab 05/16/19 2347 05/17/19 0007  WBC 13.7*  --   NEUTROABS 9.3*  --   HGB 15.0 16.0  HCT 46.8 47.0  MCV 85.4  --   PLT 251  --     Basic Metabolic Panel:  Recent Labs  Lab 05/17/19 0007 05/17/19 0612  NA 135 139  K 6.0* 3.3*  CL 102 101  CO2  --  26  GLUCOSE 90 92  BUN 31* 18  CREATININE 1.20 1.25*  CALCIUM  --  8.8*   Lipid Panel:     Component Value Date/Time   CHOL 136 05/17/2019 0612   TRIG 110 05/17/2019 0612   HDL 29 (L) 05/17/2019 0612   CHOLHDL 4.7 05/17/2019 0612   VLDL 22 05/17/2019 0612   LDLCALC 85 05/17/2019 0612   HgbA1c:  Lab Results  Component Value Date   HGBA1C 5.4 05/17/2019   Urine Drug Screen:     Component Value Date/Time   LABOPIA NONE DETECTED 05/17/2019 0218   COCAINSCRNUR NONE DETECTED 05/17/2019 0218   LABBENZ NONE DETECTED 05/17/2019 0218   AMPHETMU NONE DETECTED 05/17/2019 0218   THCU POSITIVE (A) 05/17/2019 0218   LABBARB NONE DETECTED 05/17/2019 0218    Alcohol Level     Component Value Date/Time   ETH <10 05/17/2019 0612    IMAGING past 48 hours CT HEAD WO CONTRAST  Result Date: 05/17/2019 CLINICAL DATA:  Dizziness, confusion, blurred vision EXAM: CT HEAD WITHOUT  CONTRAST TECHNIQUE: Contiguous axial images were obtained from the base of the skull through the vertex without intravenous contrast. COMPARISON:  None. FINDINGS: Brain: There is low-density noted in the left insular cortex, extending into the left basal ganglia and corona radiata compatible with acute to subacute infarct. No hemorrhage. No midline shift. No hydrocephalus. Vascular: No hyperdense vessel or unexpected calcification. Skull: No acute calvarial abnormality. Sinuses/Orbits: Visualized paranasal sinuses and mastoids clear. Orbital soft tissues unremarkable. Other: None IMPRESSION: Infarct within the left insular cortex, left basal ganglia and corona radiata, likely acute to subacute. Electronically Signed   By: Rolm Baptise M.D.   On: 05/17/2019 00:23   MR ANGIO HEAD WO CONTRAST  Result Date: 05/17/2019 CLINICAL DATA:  Initial evaluation for acute stroke, right-sided weakness, dysarthria. EXAM: MRI HEAD WITHOUT CONTRAST MRA HEAD WITHOUT CONTRAST MRA NECK WITHOUT AND WITH CONTRAST TECHNIQUE: Multiplanar, multiecho pulse sequences of the brain and surrounding structures were obtained without intravenous contrast. Angiographic images of the Circle of Willis were obtained using MRA technique without intravenous contrast. Angiographic images of the neck  were obtained using MRA technique without and with intravenous contrast. Carotid stenosis measurements (when applicable) are obtained utilizing NASCET criteria, using the distal internal carotid diameter as the denominator. CONTRAST:  7.13mL GADAVIST GADOBUTROL 1 MMOL/ML IV SOLN COMPARISON:  Prior head CT from earlier the same day. FINDINGS: MRI HEAD FINDINGS Brain: Examination mildly degraded by motion artifact. Diffuse prominence of the CSF containing spaces compatible with generalized age-related cerebral atrophy. Patchy T2/FLAIR hyperintensity within the periventricular deep white matter both cerebral hemispheres most consistent with chronic small vessel  ischemic disease, mild for age. Multifocal areas of restricted diffusion involving the left insular cortex, left basal ganglia, with a few additional patchy cortical infarcts within the overlying left frontal and temporal lobes, consistent with left MCA territory infarcts. These are acute to early subacute in appearance with associated T2/FLAIR signal abnormality. Associated petechial hemorrhage at the level of the left lentiform nucleus without frank hemorrhagic transformation. Diffusion abnormality/edema extending into the left cerebral peduncle and midbrain indicative of involvement of the underlying corticospinal tract. No significant mass effect. No other evidence for acute or subacute ischemia. Gray-white matter differentiation otherwise maintained. No encephalomalacia to suggest chronic cortical infarction elsewhere within the brain. No other foci of susceptibility artifact to suggest acute or chronic intracranial hemorrhage. No mass lesion, midline shift or mass effect. No hydrocephalus. No extra-axial fluid collection. Pituitary gland within normal limits. Midline structures intact. Vascular: Loss of normal flow void within the left ICA to the level of the terminus, which could be related to slow flow and/or occlusion. Major intracranial vascular flow voids otherwise maintained. Skull and upper cervical spine: Craniocervical junction within normal limits. Bone marrow signal intensity normal. No scalp soft tissue abnormality. Sinuses/Orbits: Globes and orbital soft tissues within normal limits. Mild scattered mucosal thickening noted within the ethmoidal sinuses. Small left maxillary sinus retention cyst noted. No significant mastoid effusion. Inner ear structures grossly normal. Other: None. MRA HEAD FINDINGS ANTERIOR CIRCULATION: Examination moderately degraded by motion artifact. 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 Distal cervical segment of the right ICA widely patent with antegrade flow.  Petrous, cavernous, and supraclinoid segments demonstrate atheromatous irregularity but are patent without definite flow-limiting stenosis. Both A1 segments widely patent. Normal anterior communicating artery. Anterior cerebral arteries patent to their distal aspects without stenosis. Right M1 widely patent. Perfusion of the left M1 via collateral flow across the circle-of-Willis and/or from the posterior circulation. Left M1 widely patent as well. Grossly negative MCA bifurcations. Distal MCA branches well perfused. Right MCA branches somewhat attenuated as compared to the contralateral left. POSTERIOR CIRCULATION: Vertebral arteries widely patent to the vertebrobasilar junction without stenosis. Right vertebral artery dominant. Posterior inferior cerebral arteries patent bilaterally. Basilar widely patent to its distal aspect without stenosis. Superior cerebral arteries patent bilaterally. PCA supplied via the basilar as well as robust bilateral posterior communicating arteries. Both PCAs well perfused to their distal aspects without stenosis. No intracranial aneurysm. MRA NECK FINDINGS AORTIC ARCH: Visualized aortic arch of normal caliber with normal 3 vessel morphology. No hemodynamically significant stenosis seen about the origin of the great vessels. Visualized subclavian arteries widely patent. RIGHT CAROTID SYSTEM: Right common carotid artery widely patent from its origin to the bifurcation without stenosis. No significant atheromatous narrowing about the right bifurcation. Right ICA widely patent distally to the skull base without stenosis or vascular occlusion. LEFT CAROTID SYSTEM: Left common carotid artery widely patent from its origin to the bifurcation. There is occlusion of the left ICA at its origin  at the left carotid bifurcation. Left ICA remains occluded to the circle-of-Willis. VERTEBRAL ARTERIES: Both vertebral arteries arise from the subclavian arteries. Right vertebral artery dominant and  widely patent to the skull base. Multifocal moderate to severe stenoses seen involving the proximal left V1 segment, likely atherosclerotic (series 1046, image 13). Left vertebral artery otherwise widely patent distally without stenosis or vascular occlusion. Incidental note made of a 3 cm right thyroid nodule, indeterminate. IMPRESSION: MRI HEAD IMPRESSION: 1. Patchy multifocal acute to early subacute ischemic left MCA territory infarcts involving the left insular cortex, left basal ganglia, and overlying left frontal and temporal lobes as above. No significant mass effect. 2. Associated petechial hemorrhage at the left lentiform nucleus without frank hemorrhagic transformation. 3. Abnormal flow void within the left ICA to the level of the terminus, consistent with occlusion. See below. 4. Underlying mild chronic microvascular ischemic disease. MRA HEAD IMPRESSION: 1. Motion degraded exam. 2. 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 Left MCA and its branches are perfused via collateral flow across the circle-of-Willis and/or from the posterior circulation. 3. Otherwise grossly negative MRA of the head. No other large vessel occlusion. No hemodynamically significant or correctable stenosis identified. MRA NECK IMPRESSION: 1. 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 2. Wide patency of the right carotid artery system within the neck. 3. 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 Left vertebral artery otherwise widely patent as is the dominant right vertebral artery. 4. Approximate 3 cm right thyroid nodule, indeterminate. Follow-up examination with dedicated thyroid ultrasound recommended for further evaluation. This could be performed on a nonemergent outpatient basis. Electronically Signed   By: Jeannine Boga M.D.   On: 05/17/2019 04:27   MR ANGIO NECK W WO CONTRAST  Result Date: 05/17/2019 CLINICAL DATA:  Initial evaluation for acute  stroke, right-sided weakness, dysarthria. EXAM: MRI HEAD WITHOUT CONTRAST MRA HEAD WITHOUT CONTRAST MRA NECK WITHOUT AND WITH CONTRAST TECHNIQUE: Multiplanar, multiecho pulse sequences of the brain and surrounding structures were obtained without intravenous contrast. Angiographic images of the Circle of Willis were obtained using MRA technique without intravenous contrast. Angiographic images of the neck were obtained using MRA technique without and with intravenous contrast. Carotid stenosis measurements (when applicable) are obtained utilizing NASCET criteria, using the distal internal carotid diameter as the denominator. CONTRAST:  7.73mL GADAVIST GADOBUTROL 1 MMOL/ML IV SOLN COMPARISON:  Prior head CT from earlier the same day. FINDINGS: MRI HEAD FINDINGS Brain: Examination mildly degraded by motion artifact. Diffuse prominence of the CSF containing spaces compatible with generalized age-related cerebral atrophy. Patchy T2/FLAIR hyperintensity within the periventricular deep white matter both cerebral hemispheres most consistent with chronic small vessel ischemic disease, mild for age. Multifocal areas of restricted diffusion involving the left insular cortex, left basal ganglia, with a few additional patchy cortical infarcts within the overlying left frontal and temporal lobes, consistent with left MCA territory infarcts. These are acute to early subacute in appearance with associated T2/FLAIR signal abnormality. Associated petechial hemorrhage at the level of the left lentiform nucleus without frank hemorrhagic transformation. Diffusion abnormality/edema extending into the left cerebral peduncle and midbrain indicative of involvement of the underlying corticospinal tract. No significant mass effect. No other evidence for acute or subacute ischemia. Gray-white matter differentiation otherwise maintained. No encephalomalacia to suggest chronic cortical infarction elsewhere within the brain. No other foci of  susceptibility artifact to suggest acute or chronic intracranial hemorrhage. No mass lesion, midline shift or  mass effect. No hydrocephalus. No extra-axial fluid collection. Pituitary gland within normal limits. Midline structures intact. Vascular: Loss of normal flow void within the left ICA to the level of the terminus, which could be related to slow flow and/or occlusion. Major intracranial vascular flow voids otherwise maintained. Skull and upper cervical spine: Craniocervical junction within normal limits. Bone marrow signal intensity normal. No scalp soft tissue abnormality. Sinuses/Orbits: Globes and orbital soft tissues within normal limits. Mild scattered mucosal thickening noted within the ethmoidal sinuses. Small left maxillary sinus retention cyst noted. No significant mastoid effusion. Inner ear structures grossly normal. Other: None. MRA HEAD FINDINGS ANTERIOR CIRCULATION: Examination moderately degraded by motion artifact. 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 Distal cervical segment of the right ICA widely patent with antegrade flow. Petrous, cavernous, and supraclinoid segments demonstrate atheromatous irregularity but are patent without definite flow-limiting stenosis. Both A1 segments widely patent. Normal anterior communicating artery. Anterior cerebral arteries patent to their distal aspects without stenosis. Right M1 widely patent. Perfusion of the left M1 via collateral flow across the circle-of-Willis and/or from the posterior circulation. Left M1 widely patent as well. Grossly negative MCA bifurcations. Distal MCA branches well perfused. Right MCA branches somewhat attenuated as compared to the contralateral left. POSTERIOR CIRCULATION: Vertebral arteries widely patent to the vertebrobasilar junction without stenosis. Right vertebral artery dominant. Posterior inferior cerebral arteries patent bilaterally. Basilar widely patent to its distal aspect without stenosis. Superior  cerebral arteries patent bilaterally. PCA supplied via the basilar as well as robust bilateral posterior communicating arteries. Both PCAs well perfused to their distal aspects without stenosis. No intracranial aneurysm. MRA NECK FINDINGS AORTIC ARCH: Visualized aortic arch of normal caliber with normal 3 vessel morphology. No hemodynamically significant stenosis seen about the origin of the great vessels. Visualized subclavian arteries widely patent. RIGHT CAROTID SYSTEM: Right common carotid artery widely patent from its origin to the bifurcation without stenosis. No significant atheromatous narrowing about the right bifurcation. Right ICA widely patent distally to the skull base without stenosis or vascular occlusion. LEFT CAROTID SYSTEM: Left common carotid artery widely patent from its origin to the bifurcation. 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 Left ICA remains occluded to the circle-of-Willis. VERTEBRAL ARTERIES: Both vertebral arteries arise from the subclavian arteries. Right vertebral artery dominant and widely patent to the skull base. Multifocal moderate to severe stenoses seen involving the proximal left V1 segment, likely atherosclerotic (series 1046, image 13). Left vertebral artery otherwise widely patent distally without stenosis or vascular occlusion. Incidental note made of a 3 cm right thyroid nodule, indeterminate. IMPRESSION: MRI HEAD IMPRESSION: 1. Patchy multifocal acute to early subacute ischemic left MCA territory infarcts involving the left insular cortex, left basal ganglia, and overlying left frontal and temporal lobes as above. No significant mass effect. 2. Associated petechial hemorrhage at the left lentiform nucleus without frank hemorrhagic transformation. 3. Abnormal flow void within the left ICA to the level of the terminus, consistent with occlusion. See below. 4. Underlying mild chronic microvascular ischemic disease. MRA HEAD  IMPRESSION: 1. Motion degraded exam. 2. 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 Left MCA and its branches are perfused via collateral flow across the circle-of-Willis and/or from the posterior circulation. 3. Otherwise grossly negative MRA of the head. No other large vessel occlusion. No hemodynamically significant or correctable stenosis identified. MRA NECK IMPRESSION: 1. Occlusion of the left ICA at its origin at the left  carotid bifurcation. 2. Wide patency of the right carotid artery system within the neck. 3. 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 Left vertebral artery otherwise widely patent as is the dominant right vertebral artery. 4. Approximate 3 cm right thyroid nodule, indeterminate. Follow-up examination with dedicated thyroid ultrasound recommended for further evaluation. This could be performed on a nonemergent outpatient basis. Electronically Signed   By: Jeannine Boga M.D.   On: 05/17/2019 04:27   MR BRAIN WO CONTRAST  Result Date: 05/17/2019 CLINICAL DATA:  Initial evaluation for acute stroke, right-sided weakness, dysarthria. EXAM: MRI HEAD WITHOUT CONTRAST MRA HEAD WITHOUT CONTRAST MRA NECK WITHOUT AND WITH CONTRAST TECHNIQUE: Multiplanar, multiecho pulse sequences of the brain and surrounding structures were obtained without intravenous contrast. Angiographic images of the Circle of Willis were obtained using MRA technique without intravenous contrast. Angiographic images of the neck were obtained using MRA technique without and with intravenous contrast. Carotid stenosis measurements (when applicable) are obtained utilizing NASCET criteria, using the distal internal carotid diameter as the denominator. CONTRAST:  7.19mL GADAVIST GADOBUTROL 1 MMOL/ML IV SOLN COMPARISON:  Prior head CT from earlier the same day. FINDINGS: MRI HEAD FINDINGS Brain: Examination mildly degraded by motion artifact. Diffuse prominence of the CSF containing spaces  compatible with generalized age-related cerebral atrophy. Patchy T2/FLAIR hyperintensity within the periventricular deep white matter both cerebral hemispheres most consistent with chronic small vessel ischemic disease, mild for age. Multifocal areas of restricted diffusion involving the left insular cortex, left basal ganglia, with a few additional patchy cortical infarcts within the overlying left frontal and temporal lobes, consistent with left MCA territory infarcts. These are acute to early subacute in appearance with associated T2/FLAIR signal abnormality. Associated petechial hemorrhage at the level of the left lentiform nucleus without frank hemorrhagic transformation. Diffusion abnormality/edema extending into the left cerebral peduncle and midbrain indicative of involvement of the underlying corticospinal tract. No significant mass effect. No other evidence for acute or subacute ischemia. Gray-white matter differentiation otherwise maintained. No encephalomalacia to suggest chronic cortical infarction elsewhere within the brain. No other foci of susceptibility artifact to suggest acute or chronic intracranial hemorrhage. No mass lesion, midline shift or mass effect. No hydrocephalus. No extra-axial fluid collection. Pituitary gland within normal limits. Midline structures intact. Vascular: Loss of normal flow void within the left ICA to the level of the terminus, which could be related to slow flow and/or occlusion. Major intracranial vascular flow voids otherwise maintained. Skull and upper cervical spine: Craniocervical junction within normal limits. Bone marrow signal intensity normal. No scalp soft tissue abnormality. Sinuses/Orbits: Globes and orbital soft tissues within normal limits. Mild scattered mucosal thickening noted within the ethmoidal sinuses. Small left maxillary sinus retention cyst noted. No significant mastoid effusion. Inner ear structures grossly normal. Other: None. MRA HEAD FINDINGS  ANTERIOR CIRCULATION: Examination moderately degraded by motion artifact. 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 Distal cervical segment of the right ICA widely patent with antegrade flow. Petrous, cavernous, and supraclinoid segments demonstrate atheromatous irregularity but are patent without definite flow-limiting stenosis. Both A1 segments widely patent. Normal anterior communicating artery. Anterior cerebral arteries patent to their distal aspects without stenosis. Right M1 widely patent. Perfusion of the left M1 via collateral flow across the circle-of-Willis and/or from the posterior circulation. Left M1 widely patent as well. Grossly negative MCA bifurcations. Distal MCA branches well perfused. Right MCA branches somewhat attenuated as compared to the contralateral left. POSTERIOR CIRCULATION: Vertebral arteries widely patent to the  vertebrobasilar junction without stenosis. Right vertebral artery dominant. Posterior inferior cerebral arteries patent bilaterally. Basilar widely patent to its distal aspect without stenosis. Superior cerebral arteries patent bilaterally. PCA supplied via the basilar as well as robust bilateral posterior communicating arteries. Both PCAs well perfused to their distal aspects without stenosis. No intracranial aneurysm. MRA NECK FINDINGS AORTIC ARCH: Visualized aortic arch of normal caliber with normal 3 vessel morphology. No hemodynamically significant stenosis seen about the origin of the great vessels. Visualized subclavian arteries widely patent. RIGHT CAROTID SYSTEM: Right common carotid artery widely patent from its origin to the bifurcation without stenosis. No significant atheromatous narrowing about the right bifurcation. Right ICA widely patent distally to the skull base without stenosis or vascular occlusion. LEFT CAROTID SYSTEM: Left common carotid artery widely patent from its origin to the bifurcation. There is occlusion of the left ICA at its origin at  the left carotid bifurcation. Left ICA remains occluded to the circle-of-Willis. VERTEBRAL ARTERIES: Both vertebral arteries arise from the subclavian arteries. Right vertebral artery dominant and widely patent to the skull base. Multifocal moderate to severe stenoses seen involving the proximal left V1 segment, likely atherosclerotic (series 1046, image 13). Left vertebral artery otherwise widely patent distally without stenosis or vascular occlusion. Incidental note made of a 3 cm right thyroid nodule, indeterminate. IMPRESSION: MRI HEAD IMPRESSION: 1. Patchy multifocal acute to early subacute ischemic left MCA territory infarcts involving the left insular cortex, left basal ganglia, and overlying left frontal and temporal lobes as above. No significant mass effect. 2. Associated petechial hemorrhage at the left lentiform nucleus without frank hemorrhagic transformation. 3. Abnormal flow void within the left ICA to the level of the terminus, consistent with occlusion. See below. 4. Underlying mild chronic microvascular ischemic disease. MRA HEAD IMPRESSION: 1. Motion degraded exam. 2. 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 Left MCA and its branches are perfused via collateral flow across the circle-of-Willis and/or from the posterior circulation. 3. Otherwise grossly negative MRA of the head. No other large vessel occlusion. No hemodynamically significant or correctable stenosis identified. MRA NECK IMPRESSION: 1. 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 2. Wide patency of the right carotid artery system within the neck. 3. 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 Left vertebral artery otherwise widely patent as is the dominant right vertebral artery. 4. Approximate 3 cm right thyroid nodule, indeterminate. Follow-up examination with dedicated thyroid ultrasound recommended for further evaluation. This could be performed on a nonemergent  outpatient basis. Electronically Signed   By: Jeannine Boga M.D.   On: 05/17/2019 04:27   DG Chest Portable 1 View  Result Date: 05/17/2019 CLINICAL DATA:  Slurred speech, right-sided weakness EXAM: PORTABLE CHEST 1 VIEW COMPARISON:  None. FINDINGS: Mild hyperinflation of the lungs. Heart and mediastinal contours are within normal limits. No focal opacities or effusions. No acute bony abnormality. IMPRESSION: No active disease. Electronically Signed   By: Rolm Baptise M.D.   On: 05/17/2019 00:08    PHYSICAL EXAM  Temp:  [98 F (36.7 C)-98.8 F (37.1 C)] 98.6 F (37 C) (01/26 0737) Pulse Rate:  [70-77] 70 (01/26 0737) Resp:  [15-24] 16 (01/26 0737) BP: (113-158)/(50-110) 113/50 (01/26 0737) SpO2:  [95 %-100 %] 100 % (01/26 0737) Weight:  [73.5 kg] 73.5 kg (01/25 2346)  General - Well nourished, well developed, in no apparent distress.  Ophthalmologic - fundi not visualized due to noncooperation.  Cardiovascular -  Regular rhythm and rate.  Mental Status -  Level of arousal and orientation to time, place, and person were intact. Language including expression, naming, repetition, comprehension was assessed and found intact. Mild to moderate dysarthria Fund of Knowledge was assessed and was intact.  Cranial Nerves II - XII - II - Visual field intact OU. III, IV, VI - Extraocular movements intact. V - Facial sensation intact bilaterally. VII - Right facial droop, and right eye close weaker than left. VIII - Hearing & vestibular intact bilaterally. X - Palate elevates symmetrically, mild to moderate dysarthria. XI - Chin turning & shoulder shrug intact bilaterally. XII - Tongue protrusion intact.  Motor Strength - The patient's strength was normal in LUE and LLE, RUE proximal 4/5 and distal 3/5. RLE 4/5 proximal and 4+/5 distally and pronator drift was present.  Bulk was normal and fasciculations were absent.   Motor Tone - Muscle tone was assessed at the neck and appendages  and was normal.  Reflexes - The patient's reflexes were symmetrical in all extremities and he had no pathological reflexes.  Sensory - Light touch, temperature/pinprick were assessed and were symmetrical.    Coordination - The patient had normal movements in the hands with no ataxia or dysmetria, but R FTN significantly slow.  Tremor was absent.  Gait and Station - deferred.   ASSESSMENT/PLAN Mr. Ruben Moore is a 74 y.o. male with history of DM presenting with R sided weakness, aphasia and dysarthria.   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 Petechial hemorrhage at L lentiform nucleus. L ICA w/ abnormal flow void c/w occlusion.    MRA head L ICA occlusion at terminus  MRA neck L ICA occlusion at origin/bifurcation. Multifocal moderate to severe proximal L V1 stenoses. 3 cm R thyroid nodule.  CTA head & neck left ICA occlusion at origin with intracranial recon via PCOM  2D Echo EF 60-65%  LDL 85  HgbA1c 5.4  Lovenox 40 mg sq daily for VTE prophylaxis  No antithrombotic prior to admission, now on ASA 325mg  and plavix 75 DAPT for 3 months and then ASA aloen.  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 Permissive hypertension (OK if < 220/120) but gradually  normalize in 5-7 days . Long-term BP goal 130-150 given left ICA occlusion . Avoid low BP  Hyperlipidemia  Home meds:  No statin  Now on lipitor 40  LDL 85, goal < 70  Continue statin at discharge  Diabetes type II Controlled  HgbA1c 5.4, goal < 7.0  CBGs  SSI  PCP follow up  Tobacco abuse  Current smoker  Smoking cessation counseling provided  Pt is willing to quit  Other Stroke Risk Factors  Advanced age  Hx ETOH use  Substance abuse - UDS:  THC POSITIVE. Patient advised to stop using due to stroke risk.  Other Active Problems  Hx bladder cancer s/p resection 2019    Hospital day # 0  Neurology will sign off. Please call with questions. Pt will follow up with [place] neurology in about 4 weeks. Thanks for the consult.  Rosalin Hawking, MD PhD Stroke Neurology 05/17/2019 11:53 AM   To contact Stroke Continuity provider, please refer to [email or web address] After hours, contact General Neurology

## 2019-05-17 NOTE — Progress Notes (Signed)
Echocardiogram 2D Echocardiogram has been performed.  Ruben Moore 05/17/2019, 10:22 AM

## 2019-05-17 NOTE — Progress Notes (Signed)
Patient noticeably weaker on upper and lower right extremity.  Two assist needed to get patient back from bedside commode to bed.  Previous ambulation require one moderate assist.  On call neurology paged and STAT CT order.

## 2019-05-17 NOTE — ED Notes (Signed)
Update pt family in regards to pt's admission, room number, and visitor hours.

## 2019-05-17 NOTE — Progress Notes (Signed)
[facility] PROGRESS NOTE  Ruben Moore [id number] DOB: 1945/11/30 DOA: 05/16/2019 PCP: Patient, No Pcp Per  Assessment/Plan:  #1.  Acute/subacute left MCA ischemic infarct, left ICA occlusion.  Patient presented with dysarthria and right-sided weakness.  Outside the window for TPA.  Lipid panel with HDL 29 otherwise within the limits of normal,  A1c 5.4, urine drug screen positive for marijuana.  Evaluated by neurology recommend complete work-up.  Neurology also discussed with interventional neuroradiology evaluation for possible CEA/stenting. -Echo -Aspirin -Statin -PT/OT -Speech therapy -N.p.o. until he passes swallow eval  #2.  Diabetes.  Hemoglobin A1c 5.4.  Appears to be diet controlled.  Serum glucose 92 this morning. -Carb modified diet -Sliding scale insulin for optimal control  #3.  Hyperkalemia.  Upon presentation his potassium level was 6.0.  This morning it is 3.3.  Chart review does not show any treatment given so reason for downward trend unclear. -Gentle IV fluids while n.p.o. -Monitor  #4.  Tobacco use. -Cessation counseling  #5.  History of bladder cancer.  Status post TURP.  Code Status: full Family Communication: patient Disposition Plan: to be determined. May be good CIR candidate but he does not seem open to idea   Consultants:  [facility]  Procedures:  echo  Antibiotics:    HPI/Subjective: Awake alert.  Denies any pain or discomfort.    74 year old man presents with right-sided weakness and dysarthria.  Work-up reveals acute/subacute stroke.  Neurology on board work-up in process  Objective: Vitals:   05/17/19 0430 05/17/19 0737  BP: (!) 149/68 (!) 113/50  Pulse: 77 70  Resp:  16  Temp: 98.8 F (37.1 C) 98.6 F (37 C)  SpO2: 100% 100%   No intake or output data in the 24 hours ending 05/17/19 0936 Filed Weights   05/16/19 2346  Weight: 73.5 kg    Exam:   General: Awake alert no acute  distress  Cardiovascular: Regular rate and rhythm no murmur gallop or rub no lower extremity edema  Respiratory: Normal effort breath sounds are clear bilaterally I hear no wheeze no rhonchi  Abdomen: Nondistended nontender to palpation positive bowel sounds throughout no guarding or rebounding  Musculoskeletal: Joints without swelling/erythema moves all extremities spontaneously  Neuro: Awake alert oriented to self and place.  Able to follow simple commands.  Moderate dysarthria.  Right grip 3 out of 5 left grip 5 out of 5 right lower extremity 3 out of 5 strength left lower extremity 5 out of 5 strength  Data Reviewed: Basic Metabolic Panel: Recent Labs  Lab 05/17/19 0007 05/17/19 0612  NA 135 139  K 6.0* 3.3*  CL 102 101  CO2  --  26  GLUCOSE 90 92  BUN 31* 18  CREATININE 1.20 1.25*  CALCIUM  --  8.8*   Liver Function Tests: Recent Labs  Lab 05/17/19 0612  AST 19  ALT 13  ALKPHOS 102  BILITOT 1.5*  PROT 6.2*  ALBUMIN 3.0*   No results for input(s): LIPASE, AMYLASE in the last 168 hours. No results for input(s): AMMONIA in the last 168 hours. CBC: Recent Labs  Lab 05/16/19 2347 05/17/19 0007  WBC 13.7*  --   NEUTROABS 9.3*  --   HGB 15.0 16.0  HCT 46.8 47.0  MCV 85.4  --   PLT 251  --    Cardiac Enzymes: No results for input(s): CKTOTAL, CKMB, CKMBINDEX, TROPONINI in the last 168 hours. BNP (last 3 results) No results for input(s): BNP in the last 8760  hours.  ProBNP (last 3 results) No results for input(s): PROBNP in the last 8760 hours.  CBG: Recent Labs  Lab 05/17/19 0738  GLUCAP 83    Recent Results (from the past 240 hour(s))  SARS CORONAVIRUS 2 (TAT 6-24 HRS) Nasopharyngeal Nasopharyngeal Swab     Status: None   Collection Time: 05/17/19 12:03 AM   Specimen: Nasopharyngeal Swab  Result Value Ref Range Status   SARS Coronavirus 2 NEGATIVE NEGATIVE Final    Comment: (NOTE) SARS-CoV-2 target nucleic acids are NOT DETECTED. The SARS-CoV-2  RNA is generally detectable in upper and lower respiratory specimens during the acute phase of infection. Negative results do not preclude SARS-CoV-2 infection, do not rule out co-infections with other pathogens, and should not be used as the sole basis for treatment or other patient management decisions. Negative results must be combined with clinical observations, patient history, and epidemiological information. The expected result is Negative. Fact Sheet for Patients: SugarRoll.be Fact Sheet for Healthcare Providers: https://www.woods-mathews.com/ This test is not yet approved or cleared by the [place] FDA and  has been authorized for detection and/or diagnosis of SARS-CoV-2 by FDA under an Emergency Use Authorization (EUA). This EUA will remain  in effect (meaning this test can be used) for the duration of the COVID-19 declaration under Section 56 4(b)(1) of the Act, 21 U.S.C. section 360bbb-3(b)(1), unless the authorization is terminated or revoked sooner. Performed at [facility], [place] [address], [place], [place] [address]      Studies: CT HEAD WO CONTRAST  Result Date: 05/17/2019 CLINICAL DATA:  Dizziness, confusion, blurred vision EXAM: CT HEAD WITHOUT CONTRAST TECHNIQUE: Contiguous axial images were obtained from the base of the skull through the vertex without intravenous contrast. COMPARISON:  None. FINDINGS: Brain: There is low-density noted in the left insular cortex, extending into the left basal ganglia and corona radiata compatible with acute to subacute infarct. No hemorrhage. No midline shift. No hydrocephalus. Vascular: No hyperdense vessel or unexpected calcification. Skull: No acute calvarial abnormality. Sinuses/Orbits: Visualized paranasal sinuses and mastoids clear. Orbital soft tissues unremarkable. Other: None IMPRESSION: Infarct within the left insular cortex, left basal ganglia and corona radiata, likely  acute to subacute. Electronically Signed   By: Rolm Baptise M.D.   On: 05/17/2019 00:23   MR ANGIO HEAD WO CONTRAST  Result Date: 05/17/2019 CLINICAL DATA:  Initial evaluation for acute stroke, right-sided weakness, dysarthria. EXAM: MRI HEAD WITHOUT CONTRAST MRA HEAD WITHOUT CONTRAST MRA NECK WITHOUT AND WITH CONTRAST TECHNIQUE: Multiplanar, multiecho pulse sequences of the brain and surrounding structures were obtained without intravenous contrast. Angiographic images of the Circle of Willis were obtained using MRA technique without intravenous contrast. Angiographic images of the neck were obtained using MRA technique without and with intravenous contrast. Carotid stenosis measurements (when applicable) are obtained utilizing NASCET criteria, using the distal internal carotid diameter as the denominator. CONTRAST:  7.44mL GADAVIST GADOBUTROL 1 MMOL/ML IV SOLN COMPARISON:  Prior head CT from earlier the same day. FINDINGS: MRI HEAD FINDINGS Brain: Examination mildly degraded by motion artifact. Diffuse prominence of the CSF containing spaces compatible with generalized age-related cerebral atrophy. Patchy T2/FLAIR hyperintensity within the periventricular deep white matter both cerebral hemispheres most consistent with chronic small vessel ischemic disease, mild for age. Multifocal areas of restricted diffusion involving the left insular cortex, left basal ganglia, with a few additional patchy cortical infarcts within the overlying left frontal and temporal lobes, consistent with left MCA territory infarcts. These are acute to early subacute in  appearance with associated T2/FLAIR signal abnormality. Associated petechial hemorrhage at the level of the left lentiform nucleus without frank hemorrhagic transformation. Diffusion abnormality/edema extending into the left cerebral peduncle and midbrain indicative of involvement of the underlying corticospinal tract. No significant mass effect. No other evidence for  acute or subacute ischemia. Gray-white matter differentiation otherwise maintained. No encephalomalacia to suggest chronic cortical infarction elsewhere within the brain. No other foci of susceptibility artifact to suggest acute or chronic intracranial hemorrhage. No mass lesion, midline shift or mass effect. No hydrocephalus. No extra-axial fluid collection. Pituitary gland within normal limits. Midline structures intact. Vascular: Loss of normal flow void within the left ICA to the level of the terminus, which could be related to slow flow and/or occlusion. Major intracranial vascular flow voids otherwise maintained. Skull and upper cervical spine: Craniocervical junction within normal limits. Bone marrow signal intensity normal. No scalp soft tissue abnormality. Sinuses/Orbits: Globes and orbital soft tissues within normal limits. Mild scattered mucosal thickening noted within the ethmoidal sinuses. Small left maxillary sinus retention cyst noted. No significant mastoid effusion. Inner ear structures grossly normal. Other: None. MRA HEAD FINDINGS ANTERIOR CIRCULATION: Examination moderately degraded by motion artifact. 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 Distal cervical segment of the right ICA widely patent with antegrade flow. Petrous, cavernous, and supraclinoid segments demonstrate atheromatous irregularity but are patent without definite flow-limiting stenosis. Both A1 segments widely patent. Normal anterior communicating artery. Anterior cerebral arteries patent to their distal aspects without stenosis. Right M1 widely patent. Perfusion of the left M1 via collateral flow across the circle-of-Willis and/or from the posterior circulation. Left M1 widely patent as well. Grossly negative MCA bifurcations. Distal MCA branches well perfused. Right MCA branches somewhat attenuated as compared to the contralateral left. POSTERIOR CIRCULATION: Vertebral arteries widely patent to the vertebrobasilar  junction without stenosis. Right vertebral artery dominant. Posterior inferior cerebral arteries patent bilaterally. Basilar widely patent to its distal aspect without stenosis. Superior cerebral arteries patent bilaterally. PCA supplied via the basilar as well as robust bilateral posterior communicating arteries. Both PCAs well perfused to their distal aspects without stenosis. No intracranial aneurysm. MRA NECK FINDINGS AORTIC ARCH: Visualized aortic arch of normal caliber with normal 3 vessel morphology. No hemodynamically significant stenosis seen about the origin of the great vessels. Visualized subclavian arteries widely patent. RIGHT CAROTID SYSTEM: Right common carotid artery widely patent from its origin to the bifurcation without stenosis. No significant atheromatous narrowing about the right bifurcation. Right ICA widely patent distally to the skull base without stenosis or vascular occlusion. LEFT CAROTID SYSTEM: Left common carotid artery widely patent from its origin to the bifurcation. 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 Left ICA remains occluded to the circle-of-Willis. VERTEBRAL ARTERIES: Both vertebral arteries arise from the subclavian arteries. Right vertebral artery dominant and widely patent to the skull base. Multifocal moderate to severe stenoses seen involving the proximal left V1 segment, likely atherosclerotic (series 1046, image 13). Left vertebral artery otherwise widely patent distally without stenosis or vascular occlusion. Incidental note made of a 3 cm right thyroid nodule, indeterminate. IMPRESSION: MRI HEAD IMPRESSION: 1. Patchy multifocal acute to early subacute ischemic left MCA territory infarcts involving the left insular cortex, left basal ganglia, and overlying left frontal and temporal lobes as above. No significant mass effect. 2. Associated petechial hemorrhage at the left lentiform nucleus without frank hemorrhagic  transformation. 3. Abnormal flow void within the left ICA to the level  of the terminus, consistent with occlusion. See below. 4. Underlying mild chronic microvascular ischemic disease. MRA HEAD IMPRESSION: 1. Motion degraded exam. 2. 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 Left MCA and its branches are perfused via collateral flow across the circle-of-Willis and/or from the posterior circulation. 3. Otherwise grossly negative MRA of the head. No other large vessel occlusion. No hemodynamically significant or correctable stenosis identified. MRA NECK IMPRESSION: 1. 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 2. Wide patency of the right carotid artery system within the neck. 3. 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 Left vertebral artery otherwise widely patent as is the dominant right vertebral artery. 4. Approximate 3 cm right thyroid nodule, indeterminate. Follow-up examination with dedicated thyroid ultrasound recommended for further evaluation. This could be performed on a nonemergent outpatient basis. Electronically Signed   By: Jeannine Boga M.D.   On: 05/17/2019 04:27   MR ANGIO NECK W WO CONTRAST  Result Date: 05/17/2019 CLINICAL DATA:  Initial evaluation for acute stroke, right-sided weakness, dysarthria. EXAM: MRI HEAD WITHOUT CONTRAST MRA HEAD WITHOUT CONTRAST MRA NECK WITHOUT AND WITH CONTRAST TECHNIQUE: Multiplanar, multiecho pulse sequences of the brain and surrounding structures were obtained without intravenous contrast. Angiographic images of the Circle of Willis were obtained using MRA technique without intravenous contrast. Angiographic images of the neck were obtained using MRA technique without and with intravenous contrast. Carotid stenosis measurements (when applicable) are obtained utilizing NASCET criteria, using the distal internal carotid diameter as the denominator. CONTRAST:  7.68mL GADAVIST GADOBUTROL 1  MMOL/ML IV SOLN COMPARISON:  Prior head CT from earlier the same day. FINDINGS: MRI HEAD FINDINGS Brain: Examination mildly degraded by motion artifact. Diffuse prominence of the CSF containing spaces compatible with generalized age-related cerebral atrophy. Patchy T2/FLAIR hyperintensity within the periventricular deep white matter both cerebral hemispheres most consistent with chronic small vessel ischemic disease, mild for age. Multifocal areas of restricted diffusion involving the left insular cortex, left basal ganglia, with a few additional patchy cortical infarcts within the overlying left frontal and temporal lobes, consistent with left MCA territory infarcts. These are acute to early subacute in appearance with associated T2/FLAIR signal abnormality. Associated petechial hemorrhage at the level of the left lentiform nucleus without frank hemorrhagic transformation. Diffusion abnormality/edema extending into the left cerebral peduncle and midbrain indicative of involvement of the underlying corticospinal tract. No significant mass effect. No other evidence for acute or subacute ischemia. Gray-white matter differentiation otherwise maintained. No encephalomalacia to suggest chronic cortical infarction elsewhere within the brain. No other foci of susceptibility artifact to suggest acute or chronic intracranial hemorrhage. No mass lesion, midline shift or mass effect. No hydrocephalus. No extra-axial fluid collection. Pituitary gland within normal limits. Midline structures intact. Vascular: Loss of normal flow void within the left ICA to the level of the terminus, which could be related to slow flow and/or occlusion. Major intracranial vascular flow voids otherwise maintained. Skull and upper cervical spine: Craniocervical junction within normal limits. Bone marrow signal intensity normal. No scalp soft tissue abnormality. Sinuses/Orbits: Globes and orbital soft tissues within normal limits. Mild scattered  mucosal thickening noted within the ethmoidal sinuses. Small left maxillary sinus retention cyst noted. No significant mastoid effusion. Inner ear structures grossly normal. Other: None. MRA HEAD FINDINGS ANTERIOR CIRCULATION: Examination moderately degraded by motion artifact. 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 Distal cervical segment of the right ICA widely patent with antegrade  flow. Petrous, cavernous, and supraclinoid segments demonstrate atheromatous irregularity but are patent without definite flow-limiting stenosis. Both A1 segments widely patent. Normal anterior communicating artery. Anterior cerebral arteries patent to their distal aspects without stenosis. Right M1 widely patent. Perfusion of the left M1 via collateral flow across the circle-of-Willis and/or from the posterior circulation. Left M1 widely patent as well. Grossly negative MCA bifurcations. Distal MCA branches well perfused. Right MCA branches somewhat attenuated as compared to the contralateral left. POSTERIOR CIRCULATION: Vertebral arteries widely patent to the vertebrobasilar junction without stenosis. Right vertebral artery dominant. Posterior inferior cerebral arteries patent bilaterally. Basilar widely patent to its distal aspect without stenosis. Superior cerebral arteries patent bilaterally. PCA supplied via the basilar as well as robust bilateral posterior communicating arteries. Both PCAs well perfused to their distal aspects without stenosis. No intracranial aneurysm. MRA NECK FINDINGS AORTIC ARCH: Visualized aortic arch of normal caliber with normal 3 vessel morphology. No hemodynamically significant stenosis seen about the origin of the great vessels. Visualized subclavian arteries widely patent. RIGHT CAROTID SYSTEM: Right common carotid artery widely patent from its origin to the bifurcation without stenosis. No significant atheromatous narrowing about the right bifurcation. Right ICA widely patent distally to  the skull base without stenosis or vascular occlusion. LEFT CAROTID SYSTEM: Left common carotid artery widely patent from its origin to the bifurcation. 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 Left ICA remains occluded to the circle-of-Willis. VERTEBRAL ARTERIES: Both vertebral arteries arise from the subclavian arteries. Right vertebral artery dominant and widely patent to the skull base. Multifocal moderate to severe stenoses seen involving the proximal left V1 segment, likely atherosclerotic (series 1046, image 13). Left vertebral artery otherwise widely patent distally without stenosis or vascular occlusion. Incidental note made of a 3 cm right thyroid nodule, indeterminate. IMPRESSION: MRI HEAD IMPRESSION: 1. Patchy multifocal acute to early subacute ischemic left MCA territory infarcts involving the left insular cortex, left basal ganglia, and overlying left frontal and temporal lobes as above. No significant mass effect. 2. Associated petechial hemorrhage at the left lentiform nucleus without frank hemorrhagic transformation. 3. Abnormal flow void within the left ICA to the level of the terminus, consistent with occlusion. See below. 4. Underlying mild chronic microvascular ischemic disease. MRA HEAD IMPRESSION: 1. Motion degraded exam. 2. 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 Left MCA and its branches are perfused via collateral flow across the circle-of-Willis and/or from the posterior circulation. 3. Otherwise grossly negative MRA of the head. No other large vessel occlusion. No hemodynamically significant or correctable stenosis identified. MRA NECK IMPRESSION: 1. 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 2. Wide patency of the right carotid artery system within the neck. 3. 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 Left vertebral artery otherwise widely patent as is the dominant right  vertebral artery. 4. Approximate 3 cm right thyroid nodule, indeterminate. Follow-up examination with dedicated thyroid ultrasound recommended for further evaluation. This could be performed on a nonemergent outpatient basis. Electronically Signed   By: Jeannine Boga M.D.   On: 05/17/2019 04:27   MR BRAIN WO CONTRAST  Result Date: 05/17/2019 CLINICAL DATA:  Initial evaluation for acute stroke, right-sided weakness, dysarthria. EXAM: MRI HEAD WITHOUT CONTRAST MRA HEAD WITHOUT CONTRAST MRA NECK WITHOUT AND WITH CONTRAST TECHNIQUE: Multiplanar, multiecho pulse sequences of the brain and surrounding structures were obtained without intravenous contrast. Angiographic images of the Circle  of Willis were obtained using MRA technique without intravenous contrast. Angiographic images of the neck were obtained using MRA technique without and with intravenous contrast. Carotid stenosis measurements (when applicable) are obtained utilizing NASCET criteria, using the distal internal carotid diameter as the denominator. CONTRAST:  7.80mL GADAVIST GADOBUTROL 1 MMOL/ML IV SOLN COMPARISON:  Prior head CT from earlier the same day. FINDINGS: MRI HEAD FINDINGS Brain: Examination mildly degraded by motion artifact. Diffuse prominence of the CSF containing spaces compatible with generalized age-related cerebral atrophy. Patchy T2/FLAIR hyperintensity within the periventricular deep white matter both cerebral hemispheres most consistent with chronic small vessel ischemic disease, mild for age. Multifocal areas of restricted diffusion involving the left insular cortex, left basal ganglia, with a few additional patchy cortical infarcts within the overlying left frontal and temporal lobes, consistent with left MCA territory infarcts. These are acute to early subacute in appearance with associated T2/FLAIR signal abnormality. Associated petechial hemorrhage at the level of the left lentiform nucleus without frank hemorrhagic  transformation. Diffusion abnormality/edema extending into the left cerebral peduncle and midbrain indicative of involvement of the underlying corticospinal tract. No significant mass effect. No other evidence for acute or subacute ischemia. Gray-white matter differentiation otherwise maintained. No encephalomalacia to suggest chronic cortical infarction elsewhere within the brain. No other foci of susceptibility artifact to suggest acute or chronic intracranial hemorrhage. No mass lesion, midline shift or mass effect. No hydrocephalus. No extra-axial fluid collection. Pituitary gland within normal limits. Midline structures intact. Vascular: Loss of normal flow void within the left ICA to the level of the terminus, which could be related to slow flow and/or occlusion. Major intracranial vascular flow voids otherwise maintained. Skull and upper cervical spine: Craniocervical junction within normal limits. Bone marrow signal intensity normal. No scalp soft tissue abnormality. Sinuses/Orbits: Globes and orbital soft tissues within normal limits. Mild scattered mucosal thickening noted within the ethmoidal sinuses. Small left maxillary sinus retention cyst noted. No significant mastoid effusion. Inner ear structures grossly normal. Other: None. MRA HEAD FINDINGS ANTERIOR CIRCULATION: Examination moderately degraded by motion artifact. 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 Distal cervical segment of the right ICA widely patent with antegrade flow. Petrous, cavernous, and supraclinoid segments demonstrate atheromatous irregularity but are patent without definite flow-limiting stenosis. Both A1 segments widely patent. Normal anterior communicating artery. Anterior cerebral arteries patent to their distal aspects without stenosis. Right M1 widely patent. Perfusion of the left M1 via collateral flow across the circle-of-Willis and/or from the posterior circulation. Left M1 widely patent as well. Grossly  negative MCA bifurcations. Distal MCA branches well perfused. Right MCA branches somewhat attenuated as compared to the contralateral left. POSTERIOR CIRCULATION: Vertebral arteries widely patent to the vertebrobasilar junction without stenosis. Right vertebral artery dominant. Posterior inferior cerebral arteries patent bilaterally. Basilar widely patent to its distal aspect without stenosis. Superior cerebral arteries patent bilaterally. PCA supplied via the basilar as well as robust bilateral posterior communicating arteries. Both PCAs well perfused to their distal aspects without stenosis. No intracranial aneurysm. MRA NECK FINDINGS AORTIC ARCH: Visualized aortic arch of normal caliber with normal 3 vessel morphology. No hemodynamically significant stenosis seen about the origin of the great vessels. Visualized subclavian arteries widely patent. RIGHT CAROTID SYSTEM: Right common carotid artery widely patent from its origin to the bifurcation without stenosis. No significant atheromatous narrowing about the right bifurcation. Right ICA widely patent distally to the skull base without stenosis or vascular occlusion. LEFT CAROTID SYSTEM: Left common carotid artery widely patent from  its origin to the bifurcation. 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 Left ICA remains occluded to the circle-of-Willis. VERTEBRAL ARTERIES: Both vertebral arteries arise from the subclavian arteries. Right vertebral artery dominant and widely patent to the skull base. Multifocal moderate to severe stenoses seen involving the proximal left V1 segment, likely atherosclerotic (series 1046, image 13). Left vertebral artery otherwise widely patent distally without stenosis or vascular occlusion. Incidental note made of a 3 cm right thyroid nodule, indeterminate. IMPRESSION: MRI HEAD IMPRESSION: 1. Patchy multifocal acute to early subacute ischemic left MCA territory infarcts involving the left  insular cortex, left basal ganglia, and overlying left frontal and temporal lobes as above. No significant mass effect. 2. Associated petechial hemorrhage at the left lentiform nucleus without frank hemorrhagic transformation. 3. Abnormal flow void within the left ICA to the level of the terminus, consistent with occlusion. See below. 4. Underlying mild chronic microvascular ischemic disease. MRA HEAD IMPRESSION: 1. Motion degraded exam. 2. 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 Left MCA and its branches are perfused via collateral flow across the circle-of-Willis and/or from the posterior circulation. 3. Otherwise grossly negative MRA of the head. No other large vessel occlusion. No hemodynamically significant or correctable stenosis identified. MRA NECK IMPRESSION: 1. 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 2. Wide patency of the right carotid artery system within the neck. 3. 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 Left vertebral artery otherwise widely patent as is the dominant right vertebral artery. 4. Approximate 3 cm right thyroid nodule, indeterminate. Follow-up examination with dedicated thyroid ultrasound recommended for further evaluation. This could be performed on a nonemergent outpatient basis. Electronically Signed   By: Jeannine Boga M.D.   On: 05/17/2019 04:27   DG Chest Portable 1 View  Result Date: 05/17/2019 CLINICAL DATA:  Slurred speech, right-sided weakness EXAM: PORTABLE CHEST 1 VIEW COMPARISON:  None. FINDINGS: Mild hyperinflation of the lungs. Heart and mediastinal contours are within normal limits. No focal opacities or effusions. No acute bony abnormality. IMPRESSION: No active disease. Electronically Signed   By: Rolm Baptise M.D.   On: 05/17/2019 00:08    Scheduled Meds: .  stroke: mapping our early stages of recovery book   Does not apply Once  . aspirin  300 mg Rectal Daily   Or  . aspirin   325 mg Oral Daily  . enoxaparin (LOVENOX) injection  40 mg Subcutaneous QHS  . feeding supplement (ENSURE ENLIVE)  237 mL Oral BID BM  . insulin aspart  0-5 Units Subcutaneous QHS  . insulin aspart  0-9 Units Subcutaneous TID WC   Continuous Infusions: . sodium chloride 75 mL/hr at 05/17/19 0213    Principal Problem:   Acute ischemic stroke [facility]) Active Problems:   Diabetes mellitus type II, non insulin dependent (HCC)   Hyperkalemia   Bladder cancer ([place])   Tobacco use    Time spent: 44 minutes    [place] NP  [facility]  If 7PM-7AM, please contact night-coverage at [email or web address], password [facility] 05/17/2019, 9:36 AM  LOS: 0 days

## 2019-05-17 NOTE — Evaluation (Signed)
Occupational Therapy Evaluation Patient Details Name: Ruben Moore MRN: [id number] DOB: [birth year] Today's Date: 05/17/2019    History of Present Illness Pt is a 74 y/o male with PMH of DM presenting to ED with R sided weakness and dysarthria. MRI reveals "large left basal ganglia infarct as well as smaller acute and subacute infarctions in the left MCA territory infarcts including the insular cortex frontal and temporal lobes with mild petechial hemorrhage.    Clinical Impression   PTA patient reports independent and driving. Admitted for above and limited by problem list below, including R sided weakness and impaired coordination/sensation, R inattention, impaired balance, decreased activity tolerance, dysarthria, and impaired cognition.  Patient oriented x 3 (not to time), follows 1 step commands with increased time but difficulty with multiple step commands, requires cueing for sequencing, motor planning and problem solving. Patient currently requires min-max assist +2 for ADLs, min assist +2 for transfer and mod assist +2 for in room mobility.  He requires mod cueing to attend to R UE during functional tasks and requires hand over hand support due to weakness.  He has good support and is highly motivated. Patient will benefit from continued OT services while admitted and after dc at CIR level to optimize independence and safety with ADLs, mobility and IADLs. Will follow acutely.     Follow Up Recommendations  CIR;Supervision/Assistance - 24 hour    Equipment Recommendations  3 in 1 bedside commode    Recommendations for Other Services Rehab consult;Speech consult     Precautions / Restrictions Precautions Precautions: Fall Restrictions Weight Bearing Restrictions: No      Mobility Bed Mobility Overal bed mobility: Needs Assistance Bed Mobility: Supine to Sit     Supine to sit: Min guard     General bed mobility comments: for safety,balance  Transfers Overall transfer  level: Needs assistance Equipment used: 2 person hand held assist Transfers: Sit to/from Stand Sit to Stand: Min assist;+2 physical assistance;+2 safety/equipment         General transfer comment: cueing for hand placement and technique, min assist to steady and power up     Balance Overall balance assessment: Needs assistance Sitting-balance support: No upper extremity supported;Feet supported Sitting balance-Leahy Scale: Fair Sitting balance - Comments: able to reach forward to attempt donning socks with supervision    Standing balance support: Bilateral upper extremity supported;During functional activity Standing balance-Leahy Scale: Poor Standing balance comment: relaint on BUE and external support                           ADL either performed or assessed with clinical judgement   ADL Overall ADL's : Needs assistance/impaired Eating/Feeding: NPO   Grooming: Moderate assistance;Sitting   Upper Body Bathing: Sitting;Moderate assistance   Lower Body Bathing: Moderate assistance;Sit to/from stand   Upper Body Dressing : Moderate assistance;Sitting   Lower Body Dressing: Maximal assistance;+2 for physical assistance;+2 for safety/equipment;Sit to/from stand Lower Body Dressing Details (indicate cue type and reason): requires assist for socks, min assist +2 sit to stand  Toilet Transfer: +2 for physical assistance;+2 for safety/equipment;Moderate assistance;Ambulation Toilet Transfer Details (indicate cue type and reason): simulated to recliner          Functional mobility during ADLs: Moderate assistance;Minimal assistance;+2 for physical assistance;+2 for safety/equipment General ADL Comments: patient limited by R hemiparesis, impaired balance, decreased activity tolerance and R inattention, as well as cognition      Vision Baseline Vision/History: Wears glasses Wears Glasses:  At all times Patient Visual Report: No change from baseline Vision  Assessment?: No apparent visual deficits     Perception Perception Perception Tested?: Yes Perception Deficits: Inattention/neglect Inattention/Neglect: Does not attend to right side of body   Praxis      Pertinent Vitals/Pain Pain Assessment: No/denies pain     Hand Dominance Right   Extremity/Trunk Assessment Upper Extremity Assessment Upper Extremity Assessment: RUE deficits/detail RUE Deficits / Details: grossly 3-/5 MMT, limited coordination and impaired sensation, decreased awareness to UE and requires cueing and physical support to use functionally RUE Sensation: decreased light touch;decreased proprioception RUE Coordination: decreased gross motor;decreased fine motor   Lower Extremity Assessment Lower Extremity Assessment: Defer to PT evaluation   Cervical / Trunk Assessment Cervical / Trunk Assessment: Normal   Communication Communication Communication: Expressive difficulties(dysarthric)   Cognition Arousal/Alertness: Awake/alert Behavior During Therapy: Flat affect Overall Cognitive Status: Impaired/Different from baseline Area of Impairment: Orientation;Attention;Memory;Following commands;Safety/judgement;Awareness;Problem solving                 Orientation Level: Disoriented to;Time Current Attention Level: Sustained Memory: Decreased short-term memory;Decreased recall of precautions Following Commands: Follows one step commands with increased time;Follows multi-step commands inconsistently Safety/Judgement: Decreased awareness of safety;Decreased awareness of deficits Awareness: Emergent Problem Solving: Slow processing;Decreased initiation;Difficulty sequencing;Requires verbal cues;Requires tactile cues General Comments: patient disoriented to time reporting 2024, poor recall at completion of session; follows commands with increased time with decreased attention to task, decreased awareness of R side (inattention)   General Comments  VSS,  encouraged patient to complete R UE gross hand flex/extension and shoulder flexion throughout the day- completed x 5 reps 2 sets during session    Exercises     Shoulder Instructions      Home Living Family/patient expects to be discharged to:: Private residence Living Arrangements: Alone Available Help at Discharge: Family;Available 24 hours/day Type of Home: Apartment Home Access: Level entry     Home Layout: One level     Bathroom Shower/Tub: Teacher, early years/pre: Standard     Home Equipment: None   Additional Comments: daughter, Solmon Ice, called during session, reports 24/7 can be arranged      Prior Functioning/Environment Level of Independence: Independent        Comments: driving, independnet IADLs         OT Problem List: Decreased strength;Decreased range of motion;Decreased activity tolerance;Impaired balance (sitting and/or standing);Impaired vision/perception;Decreased coordination;Decreased cognition;Decreased safety awareness;Decreased knowledge of use of DME or AE;Decreased knowledge of precautions;Impaired sensation;Impaired UE functional use      OT Treatment/Interventions: Self-care/ADL training;DME and/or AE instruction;Neuromuscular education;Therapeutic activities;Cognitive remediation/compensation;Visual/perceptual remediation/compensation    OT Goals(Current goals can be found in the care plan section) Acute Rehab OT Goals Patient Stated Goal: to get better OT Goal Formulation: With patient Time For Goal Achievement: 05/31/19 Potential to Achieve Goals: Good  OT Frequency: Min 2X/week   Barriers to D/C:            Co-evaluation PT/OT/SLP Co-Evaluation/Treatment: Yes Reason for Co-Treatment: For patient/therapist safety;To address functional/ADL transfers   OT goals addressed during session: ADL's and self-care      AM-PAC OT "6 Clicks" Daily Activity     Outcome Measure Help from another person eating meals?:  Total Help from another person taking care of personal grooming?: A Lot Help from another person toileting, which includes using toliet, bedpan, or urinal?: A Lot Help from another person bathing (including washing, rinsing, drying)?: A Lot Help from another person to put on and  taking off regular upper body clothing?: A Lot Help from another person to put on and taking off regular lower body clothing?: A Lot 6 Click Score: 11   End of Session Equipment Utilized During Treatment: Gait belt Nurse Communication: Mobility status;Precautions  Activity Tolerance: Patient tolerated treatment well Patient left: in chair;with call bell/phone within reach;with chair alarm set  OT Visit Diagnosis: Other abnormalities of gait and mobility (R26.89);Muscle weakness (generalized) (M62.81);Other symptoms and signs involving the nervous system (R29.898);Other symptoms and signs involving cognitive function;Hemiplegia and hemiparesis Hemiplegia - Right/Left: Right Hemiplegia - dominant/non-dominant: Dominant Hemiplegia - caused by: Cerebral infarction                [id number] OT Time Calculation (min): 28 min Charges:  OT General Charges $OT Visit: 1 Visit OT Evaluation $OT Eval Moderate Complexity: 1 Mod  Jolaine Artist, OT Acute Rehabilitation Services Pager [phone] Office [phone]   Delight Stare 05/17/2019, 9:29 AM

## 2019-05-17 NOTE — Evaluation (Signed)
Physical Therapy Evaluation Patient Details Name: Ruben Moore MRN: [id number] DOB: [birth year] Today's Date: 05/17/2019   History of Present Illness  Pt is a 74 y/o male with PMH of DM presenting to ED with R sided weakness and dysarthria. MRI reveals "large left basal ganglia infarct as well as smaller acute and subacute infarctions in the left MCA territory infarcts including the insular cortex frontal and temporal lobes with mild petechial hemorrhage.   Clinical Impression  Pt admitted with above. Pt was living alone and indep PTA. Pt now with R sided inattention, weakness, impaired co-ordination, ataxic/cross over gait pattern with R LE requiring modA x2 for safe ambulation. Pt with noted R sided inattention, impaired memory, slow processing, and impaired sequencing. Pt motivated and pushing self during PT session. Pt to greatly benefit from CIR upon d/c for maximal functional return for safe transition home. Acute PT to cont to follow.    Follow Up Recommendations CIR    Equipment Recommendations  (TBD at next venue)    Recommendations for Other Services Rehab consult     Precautions / Restrictions Precautions Precautions: Fall Restrictions Weight Bearing Restrictions: No      Mobility  Bed Mobility Overal bed mobility: Needs Assistance Bed Mobility: Supine to Sit     Supine to sit: Min guard     General bed mobility comments: for safety,balance  Transfers Overall transfer level: Needs assistance Equipment used: 2 person hand held assist Transfers: Sit to/from Stand Sit to Stand: Min assist;+2 physical assistance;+2 safety/equipment         General transfer comment: cueing for hand placement and technique, min assist to steady and power up , pt with R lateral lean due to impaired sensation, proprioception and strength  Ambulation/Gait Ambulation/Gait assistance: Mod assist;+2 physical assistance Gait Distance (Feet): 45 Feet Assistive device: 2 person hand  held assist Gait Pattern/deviations: Decreased step length - left;Decreased stance time - right;Decreased dorsiflexion - right;Ataxic;Narrow base of support Gait velocity: dec Gait velocity interpretation: <1.8 ft/sec, indicate of risk for recurrent falls General Gait Details: pt with impaired R LE co-ordination requiring minA at R LE to prevent cross over gait pattern and to support behind the knee to prevent knee hyperextension, pt did not push down with R Hand into PTs hand but did utilize L HHA. verbal cues to increased L LE step lenght, pt improved as session progressed,  Stairs            Wheelchair Mobility    Modified Rankin (Stroke Patients Only) Modified Rankin (Stroke Patients Only) Pre-Morbid Rankin Score: No significant disability Modified Rankin: Moderately severe disability     Balance Overall balance assessment: Needs assistance Sitting-balance support: No upper extremity supported;Feet supported Sitting balance-Leahy Scale: Fair Sitting balance - Comments: able to reach forward to attempt donning socks with supervision    Standing balance support: Bilateral upper extremity supported;During functional activity Standing balance-Leahy Scale: Poor Standing balance comment: relaint on BUE and external support                             Pertinent Vitals/Pain Pain Assessment: No/denies pain    Home Living Family/patient expects to be discharged to:: Private residence Living Arrangements: Alone Available Help at Discharge: Family;Available 24 hours/day Type of Home: Apartment Home Access: Level entry     Home Layout: One level Home Equipment: None Additional Comments: daughter, Solmon Ice, called during session, reports 24/7 can be arranged    Prior Function  Level of Independence: Independent         Comments: driving, independnet IADLs      Hand Dominance   Dominant Hand: Right    Extremity/Trunk Assessment   Upper Extremity  Assessment Upper Extremity Assessment: Defer to OT evaluation RUE Deficits / Details: grossly 3-/5 MMT, limited coordination and impaired sensation, decreased awareness to UE and requires cueing and physical support to use functionally RUE Sensation: decreased light touch;decreased proprioception RUE Coordination: decreased gross motor;decreased fine motor    Lower Extremity Assessment Lower Extremity Assessment: RLE deficits/detail RLE Deficits / Details: grossly 3-/5 t/o LE RLE Sensation: (impaired by 30%) RLE Coordination: decreased fine motor;decreased gross motor    Cervical / Trunk Assessment Cervical / Trunk Assessment: Normal  Communication   Communication: Expressive difficulties(dysarthric)  Cognition Arousal/Alertness: Awake/alert Behavior During Therapy: Flat affect Overall Cognitive Status: Impaired/Different from baseline Area of Impairment: Orientation;Attention;Memory;Following commands;Safety/judgement;Awareness;Problem solving                 Orientation Level: Disoriented to;Time Current Attention Level: Sustained Memory: Decreased short-term memory;Decreased recall of precautions Following Commands: Follows one step commands with increased time;Follows multi-step commands inconsistently Safety/Judgement: Decreased awareness of safety;Decreased awareness of deficits Awareness: Emergent Problem Solving: Slow processing;Decreased initiation;Difficulty sequencing;Requires verbal cues;Requires tactile cues General Comments: pt disoriented to date and was unable to recall at end of session after being re-oriented. Pt following commands with delay, noted R sided inattention      General Comments General comments (skin integrity, edema, etc.): VSS    Exercises     Assessment/Plan    PT Assessment Patient needs continued PT services  PT Problem List Decreased strength;Decreased range of motion;Decreased activity tolerance;Decreased balance;Decreased  mobility;Decreased coordination;Decreased cognition;Decreased knowledge of use of DME;Decreased safety awareness       PT Treatment Interventions DME instruction;Gait training;Stair training;Functional mobility training;Therapeutic activities;Therapeutic exercise;Balance training;Neuromuscular re-education;Cognitive remediation;Patient/family education    PT Goals (Current goals can be found in the Care Plan section)  Acute Rehab PT Goals Patient Stated Goal: to get better PT Goal Formulation: With patient Time For Goal Achievement: 05/31/19 Potential to Achieve Goals: Good    Frequency Min 4X/week   Barriers to discharge Decreased caregiver support lives alone but spoke with dtr who reports she can provide 24/7 assist    Co-evaluation PT/OT/SLP Co-Evaluation/Treatment: Yes Reason for Co-Treatment: Complexity of the patient's impairments (multi-system involvement) PT goals addressed during session: Mobility/safety with mobility OT goals addressed during session: ADL's and self-care       AM-PAC PT "6 Clicks" Mobility  Outcome Measure Help needed turning from your back to your side while in a flat bed without using bedrails?: A Little Help needed moving from lying on your back to sitting on the side of a flat bed without using bedrails?: A Little Help needed moving to and from a bed to a chair (including a wheelchair)?: A Lot Help needed standing up from a chair using your arms (e.g., wheelchair or bedside chair)?: A Lot Help needed to walk in hospital room?: A Lot Help needed climbing 3-5 steps with a railing? : Total 6 Click Score: 13    End of Session Equipment Utilized During Treatment: Gait belt Activity Tolerance: Patient tolerated treatment well Patient left: in chair;with call bell/phone within reach;with chair alarm set Nurse Communication: Mobility status PT Visit Diagnosis: Unsteadiness on feet (R26.81);Muscle weakness (generalized) (M62.81);Difficulty in walking,  not elsewhere classified (R26.2);Hemiplegia and hemiparesis Hemiplegia - Right/Left: Right Hemiplegia - dominant/non-dominant: Dominant Hemiplegia - caused by: Cerebral infarction  Time: [id number] PT Time Calculation (min) (ACUTE ONLY): 30 min   Charges:   PT Evaluation $PT Eval Moderate Complexity: 1 Mod          Kittie Plater, PT, DPT Acute Rehabilitation Services Pager #: [phone] Office #: [phone]   Berline Lopes 05/17/2019, 9:46 AM

## 2019-05-17 NOTE — H&P (Signed)
History and Physical    Marsean Hoeger [id number] DOB: Mar 14, 1946 DOA: 05/16/2019  PCP: No primary care provider on file.   Patient coming from: Home   Chief Complaint: Right-sided weakness, speech disturbance   HPI: Ruben Moore is a 74 y.o. male with medical history significant for diabetes mellitus, now presenting to the emergency department with right-sided weakness and dysarthria.  Patient reports that he had been in his usual state of health a week ago, but does not remember anything from the past week and believes that he had been unconscious before waking this evening.  He reports waking with weakness involving the right side.  He denies any history of focal weakness.  He denies any headache, change in vision or hearing, or numbness.  He denies any chest pain or palpitations.  He denies any recent fevers or chills.  Reports that he takes an oral medication for diabetes and a couple other medications that he does not know the name of or indication.  Patient lives alone, has 1 surviving sibling, and a daughter, but no one who checks on him regularly.  ED Course: Upon arrival to the ED, patient is found to be afebrile, saturating well on room air, and hypertensive with diastolic pressure [id number].  EKG features a sinus rhythm and chest x-rays negative for acute cardiopulmonary disease.  CBC notable for leukocytosis to 13,700.  CT head concerning for acute to subacute infarction within the left insular cortex, left basal ganglia, and left corona radiata.  Chemistry panel and COVID-19 screening tests are pending, neurology was consulted by the ED physician, and hospitalists are asked to admit.  Review of Systems:  All other systems reviewed and apart from HPI, are negative.  Past Medical History:  Diagnosis Date  . Diabetes mellitus without complication ([place])     History reviewed. No pertinent surgical history.   reports that he has quit smoking. He has quit using smokeless tobacco. He  reports previous alcohol use. He reports current drug use. Drug: Marijuana.  No Known Allergies  Family History  Problem Relation Age of Onset  . Diabetes Brother      Prior to Admission medications   Not on File    Physical Exam: Vitals:   05/16/19 2346 05/17/19 0000 05/17/19 0045 05/17/19 0130  BP:  (!) 125/110 135/71 (!) 149/63  Pulse:  75 72 71  Resp:  (!) 21 (!) 24 (!) 22  Temp: 98 F (36.7 C)     TempSrc: Oral     SpO2:  99% 99% 95%  Weight: 73.5 kg     Height: 6\' 3"  (1.905 m)        Constitutional: NAD, calm  Eyes: PERTLA, lids and conjunctivae normal ENMT: Mucous membranes are moist. Posterior pharynx clear of any exudate or lesions.   Neck: normal, supple, no masses, no thyromegaly Respiratory: clear to auscultation bilaterally, no wheezing, no crackles. No accessory muscle use.  Cardiovascular: S1 & S2 heard, regular rate and rhythm. No extremity edema. No significant JVD. Abdomen: No distension, no tenderness, soft. Bowel sounds active.  Musculoskeletal: no clubbing / cyanosis. No joint deformity upper and lower extremities.   Skin: no significant rashes, lesions, ulcers. Warm, dry, well-perfused. Neurologic: Right lower facial weakness. Dysarthria. PERRL, EOMI. Sensation intact. Strength 3/5 on right. Strength 5/5 throughout LUE and LLE.   Psychiatric: Alert and oriented to person, place, and situation. Calm, cooperative.    Labs and Imaging on Admission: I have personally reviewed following labs and imaging studies  CBC: Recent Labs  Lab 05/16/19 2347 05/17/19 0007  WBC 13.7*  --   NEUTROABS 9.3*  --   HGB 15.0 16.0  HCT 46.8 47.0  MCV 85.4  --   PLT 251  --    Basic Metabolic Panel: Recent Labs  Lab 05/17/19 0007  NA 135  K 6.0*  CL 102  GLUCOSE 90  BUN 31*  CREATININE 1.20   GFR: Estimated Creatinine Clearance: 57 mL/min (by C-G formula based on SCr of 1.2 mg/dL). Liver Function Tests: No results for input(s): AST, ALT, ALKPHOS,  BILITOT, PROT, ALBUMIN in the last 168 hours. No results for input(s): LIPASE, AMYLASE in the last 168 hours. No results for input(s): AMMONIA in the last 168 hours. Coagulation Profile: Recent Labs  Lab 05/17/19 0043  INR 1.1   Cardiac Enzymes: No results for input(s): CKTOTAL, CKMB, CKMBINDEX, TROPONINI in the last 168 hours. BNP (last 3 results) No results for input(s): PROBNP in the last 8760 hours. HbA1C: No results for input(s): HGBA1C in the last 72 hours. CBG: No results for input(s): GLUCAP in the last 168 hours. Lipid Profile: No results for input(s): CHOL, HDL, LDLCALC, TRIG, CHOLHDL, LDLDIRECT in the last 72 hours. Thyroid Function Tests: No results for input(s): TSH, T4TOTAL, FREET4, T3FREE, THYROIDAB in the last 72 hours. Anemia Panel: No results for input(s): VITAMINB12, FOLATE, FERRITIN, TIBC, IRON, RETICCTPCT in the last 72 hours. Urine analysis: No results found for: COLORURINE, APPEARANCEUR, LABSPEC, PHURINE, GLUCOSEU, HGBUR, BILIRUBINUR, KETONESUR, PROTEINUR, UROBILINOGEN, NITRITE, LEUKOCYTESUR Sepsis Labs: @LABRCNTIP (procalcitonin:4,lacticidven:4) )No results found for this or any previous visit (from the past 240 hour(s)).   Radiological Exams on Admission: CT HEAD WO CONTRAST  Result Date: 05/17/2019 CLINICAL DATA:  Dizziness, confusion, blurred vision EXAM: CT HEAD WITHOUT CONTRAST TECHNIQUE: Contiguous axial images were obtained from the base of the skull through the vertex without intravenous contrast. COMPARISON:  None. FINDINGS: Brain: There is low-density noted in the left insular cortex, extending into the left basal ganglia and corona radiata compatible with acute to subacute infarct. No hemorrhage. No midline shift. No hydrocephalus. Vascular: No hyperdense vessel or unexpected calcification. Skull: No acute calvarial abnormality. Sinuses/Orbits: Visualized paranasal sinuses and mastoids clear. Orbital soft tissues unremarkable. Other: None IMPRESSION:  Infarct within the left insular cortex, left basal ganglia and corona radiata, likely acute to subacute. Electronically Signed   By: Rolm Baptise M.D.   On: 05/17/2019 00:23   DG Chest Portable 1 View  Result Date: 05/17/2019 CLINICAL DATA:  Slurred speech, right-sided weakness EXAM: PORTABLE CHEST 1 VIEW COMPARISON:  None. FINDINGS: Mild hyperinflation of the lungs. Heart and mediastinal contours are within normal limits. No focal opacities or effusions. No acute bony abnormality. IMPRESSION: No active disease. Electronically Signed   By: Rolm Baptise M.D.   On: 05/17/2019 00:08    EKG: Independently reviewed. Sinus rhythm, rate 75, QTc 398 ms.   Assessment/Plan   1. Ischemic CVA  - Presents with right sided weakness and dysarthria, unclear time of onset, and is found on CT head to have acute-to-subacute appearing infarction involving left insular cortex, basal ganglia, and corona radiata  - Neurology is consulting and much appreciated  - Continue neuro checks, cardiac monitoring, NPO pending swallow screen, and consult with PT/OT/SLP  - Check MRI brain, carotid imaging, echocardiogram, A1c, and lipid panel  - Start ASA, obtain medical records   2. Diabetes  - Patient reports history of DM for which he takes an oral medication  - Check A1c, monitor  CBG's, use low-intensity SSI with Novolog for now   3. Hyperkalemia - Potassium is 6.0 in ED without EKG changes, sample may have hemolyzed   - Continue cardiac monitoring, repeat chem panel now     DVT prophylaxis: Lovenox  Code Status: Full  Family Communication: Discussed with patient  Consults called: Neurology consulted by ED physician  Admission status: Observation     Vianne Bulls, MD [facility] Pager: See [email or web address]  If 7AM-7PM, please contact the daytime attending [email or web address]  05/17/2019, 1:57 AM

## 2019-05-17 NOTE — Progress Notes (Signed)
Rehab Admissions Coordinator Note:  Per OT recommendation, patient was screened by Michel Santee for appropriateness for an Inpatient Acute Rehab Consult.  At this time, we are recommending Inpatient Rehab consult.  I will place an order.   Michel Santee 05/17/2019, 9:45 AM  I can be reached at [phone].

## 2019-05-17 NOTE — Progress Notes (Signed)
  HPI: Ruben Moore is a 74 y.o. right-handed male with history of bladder cancer, diabetes mellitus, tobacco abuse.  Per chart review patient lives alone independent prior to admission.  He is retired.  He does have a daughter in the area that works.  Presented 05/17/2019 with right-sided weakness and dysarthria.  Noted systolic blood pressure in the 150s.  SARS coronavirus negative, potassium 6.0, troponin negative, urine drug  screen positive marijuana, alcohol level negative.  Cranial CT scan showed infarction within the left insular cortex left basal ganglia corona radiata likely acute to subacute.  No midline shift.  Patient did not receive TPA.  MRI of the brain patchy multifocal acute to early subacute ischemia left MCA territory.  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  Echocardiogram pending.  CT angiogram of head and neck pending.  Neurology consulted presently on aspirin 325 mg daily for CVA prophylaxis and Lovenox for DVT prophylaxis.   On review of the chart, Pt has R sided weakness, aphasia and dysarthria and inattention due to L basal Ganglia infarct  Is min-mod assist for ADLs currently and mod -max assist for mobility. He lives alone, but per daughter, can arrange 24/7 assistance.  Pt is veteran and likely has services he can pull on via the [place] for care since appears is already plugged into the [place] system by getting his bladder CA treated last year (a common Agent [place] Dx) along with his DM.  Of note, pt will also need counseling since THC(+) and has a hx of EtOH abuse per chart as well as previous noncompliance with Plavix vs other blood thinner of some kind, per pt/notes.   I think, if able to be arranged, he is great candidate for inpt rehab and would strongly benefit from our services,  PT/OT/SLP, however think the main issue would be insurance status- see what the [place] can offer for him if he doesn't have Medicare at 74 yrs old.

## 2019-05-17 NOTE — Progress Notes (Signed)
SLP Cancellation Note  Patient Details Name: Ruben Moore MRN: [id number] DOB: 10-30-1945   Cancelled treatment:       Reason Eval/Treat Not Completed: unavailable. Will continue efforts to complete cognitive linguistic evaluation.  [name] B. Quentin Ore, [facility], [place] Speech Language Pathologist Office: [phone] Pager: [phone]   Shonna Chock 05/17/2019, 3:15 PM

## 2019-05-17 NOTE — ED Notes (Signed)
Pt transported to MRI 

## 2019-05-17 NOTE — ED Notes (Signed)
RN to redraw PT/PTT

## 2019-05-17 NOTE — ED Notes (Signed)
Redrew APTT and CMP. Sending to lab at this time

## 2019-05-17 NOTE — Consult Note (Signed)
Requesting Physician: Dr. Dayna Barker    Chief Complaint:   History obtained from: Patient and Chart     HPI:                                                                                                                                       Ruben Moore is a 74 y.o. male with past medical history significant for diabetes mellitus presents to the emergency department with right-sided weakness and dysarthria.  According to the patient, last known well was Saturday evening.  He woke up Sunday morning and noted that his right arm is weak and speech was slurred.  He states that he was crawling around the house.  He also mentions something about similar symptoms occurring on June 13, but overall is a poor historian and cannot recall the events correctly. Patient was brought by EMS today.  He was not stroke alerted as he was outside 24 hr window.   Blood pressure was [id number] systolic, CBG was 0000000.  Patient was admitted to hospitalist service and neurology was consulted around 3:30 AM.  On my assessment, patient is alert and oriented, although has some difficulty expressing words and significant dysarthria.  He also has right-sided weakness.  MRI brain was obtained which showed large left basal ganglia infarct as well as smaller acute and subacute infarctions in the left MCA territory infarcts including the insular cortex frontal and temporal lobes with mild petechial hemorrhage.  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    Date last known well: Unclear, possibly 05/13/18 tPA Given: No, outside window  NIHSS: 6 Baseline MRS 0   Past Medical History:  Diagnosis Date  . Diabetes mellitus without complication ([place])     History reviewed. No pertinent surgical history.  Family History  Problem Relation Age of Onset  . Diabetes Brother    Social History:  reports that he has quit smoking. He has quit using smokeless tobacco. He reports previous  alcohol use. He reports current drug use. Drug: Marijuana.  Allergies: No Known Allergies  Medications:                                                                                                                        I reviewed home medications   ROS:  14 systems reviewed and negative except abov   Examination:                                                                                                      General: Appears well-developed  Psych: Affect appropriate to situation Eyes: No scleral injection HENT: No OP obstrucion Head: Normocephalic.  Cardiovascular: Normal rate and regular rhythm. Respiratory: Effort normal and breath sounds normal to anterior ascultation GI: Soft.  No distension. There is no tenderness.  Skin: WDI    Neurological Examination Mental Status: Alert, oriented to place person and time.  Thought content a with some confusion.  Difficulty expresses some words, but speaks in sentences which is mostly appropriate.  Comprehension appears to be intact and able to follow simple commands. Cranial Nerves: II: Visual fields grossly normal III,IV, VI: ptosis not present, extra-ocular motions intact bilaterally, pupils equal, round, reactive to light and accommodation V,VII: Right facial droop, reduced sensation to the right side VIII: hearing normal bilaterally IX,X: uvula rises symmetrically XI: bilateral shoulder shrug XII: midline tongue extension Motor: Right : Upper extremity   4/5, pronator drift and reduced grip strength   left:     Upper extremity   5/5  Lower extremity   4/5     Lower extremity   4+/5 Tone and bulk:normal tone throughout; no atrophy noted Sensory: Reduced sensation to light touch and temperature on the right arm and leg Deep Tendon Reflexes: 3+ right patella, 2+ left patella Plantars: Right:  downgoing   Left: downgoing Cerebellar: No gross ataxia outside proportion to weakness      Lab Results: Basic Metabolic Panel: Recent Labs  Lab 05/17/19 0007  NA 135  K 6.0*  CL 102  GLUCOSE 90  BUN 31*  CREATININE 1.20    CBC: Recent Labs  Lab 05/16/19 2347 05/17/19 0007  WBC 13.7*  --   NEUTROABS 9.3*  --   HGB 15.0 16.0  HCT 46.8 47.0  MCV 85.4  --   PLT 251  --     Coagulation Studies: Recent Labs    05/17/19 0043  LABPROT 13.6  INR 1.1    Imaging: CT HEAD WO CONTRAST  Result Date: 05/17/2019 CLINICAL DATA:  Dizziness, confusion, blurred vision EXAM: CT HEAD WITHOUT CONTRAST TECHNIQUE: Contiguous axial images were obtained from the base of the skull through the vertex without intravenous contrast. COMPARISON:  None. FINDINGS: Brain: There is low-density noted in the left insular cortex, extending into the left basal ganglia and corona radiata compatible with acute to subacute infarct. No hemorrhage. No midline shift. No hydrocephalus. Vascular: No hyperdense vessel or unexpected calcification. Skull: No acute calvarial abnormality. Sinuses/Orbits: Visualized paranasal sinuses and mastoids clear. Orbital soft tissues unremarkable. Other: None IMPRESSION: Infarct within the left insular cortex, left basal ganglia and corona radiata, likely acute to subacute. Electronically Signed   By: Rolm Baptise M.D.   On: 05/17/2019 00:23   MR ANGIO HEAD WO CONTRAST  Result Date: 05/17/2019 CLINICAL DATA:  Initial evaluation for acute stroke, right-sided weakness, dysarthria. EXAM: MRI HEAD WITHOUT CONTRAST  MRA HEAD WITHOUT CONTRAST MRA NECK WITHOUT AND WITH CONTRAST TECHNIQUE: Multiplanar, multiecho pulse sequences of the brain and surrounding structures were obtained without intravenous contrast. Angiographic images of the Circle of Willis were obtained using MRA technique without intravenous contrast. Angiographic images of the neck were obtained using MRA technique without  and with intravenous contrast. Carotid stenosis measurements (when applicable) are obtained utilizing NASCET criteria, using the distal internal carotid diameter as the denominator. CONTRAST:  7.17mL GADAVIST GADOBUTROL 1 MMOL/ML IV SOLN COMPARISON:  Prior head CT from earlier the same day. FINDINGS: MRI HEAD FINDINGS Brain: Examination mildly degraded by motion artifact. Diffuse prominence of the CSF containing spaces compatible with generalized age-related cerebral atrophy. Patchy T2/FLAIR hyperintensity within the periventricular deep white matter both cerebral hemispheres most consistent with chronic small vessel ischemic disease, mild for age. Multifocal areas of restricted diffusion involving the left insular cortex, left basal ganglia, with a few additional patchy cortical infarcts within the overlying left frontal and temporal lobes, consistent with left MCA territory infarcts. These are acute to early subacute in appearance with associated T2/FLAIR signal abnormality. Associated petechial hemorrhage at the level of the left lentiform nucleus without frank hemorrhagic transformation. Diffusion abnormality/edema extending into the left cerebral peduncle and midbrain indicative of involvement of the underlying corticospinal tract. No significant mass effect. No other evidence for acute or subacute ischemia. Gray-white matter differentiation otherwise maintained. No encephalomalacia to suggest chronic cortical infarction elsewhere within the brain. No other foci of susceptibility artifact to suggest acute or chronic intracranial hemorrhage. No mass lesion, midline shift or mass effect. No hydrocephalus. No extra-axial fluid collection. Pituitary gland within normal limits. Midline structures intact. Vascular: Loss of normal flow void within the left ICA to the level of the terminus, which could be related to slow flow and/or occlusion. Major intracranial vascular flow voids otherwise maintained. Skull and upper  cervical spine: Craniocervical junction within normal limits. Bone marrow signal intensity normal. No scalp soft tissue abnormality. Sinuses/Orbits: Globes and orbital soft tissues within normal limits. Mild scattered mucosal thickening noted within the ethmoidal sinuses. Small left maxillary sinus retention cyst noted. No significant mastoid effusion. Inner ear structures grossly normal. Other: None. MRA HEAD FINDINGS ANTERIOR CIRCULATION: Examination moderately degraded by motion artifact. 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 Distal cervical segment of the right ICA widely patent with antegrade flow. Petrous, cavernous, and supraclinoid segments demonstrate atheromatous irregularity but are patent without definite flow-limiting stenosis. Both A1 segments widely patent. Normal anterior communicating artery. Anterior cerebral arteries patent to their distal aspects without stenosis. Right M1 widely patent. Perfusion of the left M1 via collateral flow across the circle-of-Willis and/or from the posterior circulation. Left M1 widely patent as well. Grossly negative MCA bifurcations. Distal MCA branches well perfused. Right MCA branches somewhat attenuated as compared to the contralateral left. POSTERIOR CIRCULATION: Vertebral arteries widely patent to the vertebrobasilar junction without stenosis. Right vertebral artery dominant. Posterior inferior cerebral arteries patent bilaterally. Basilar widely patent to its distal aspect without stenosis. Superior cerebral arteries patent bilaterally. PCA supplied via the basilar as well as robust bilateral posterior communicating arteries. Both PCAs well perfused to their distal aspects without stenosis. No intracranial aneurysm. MRA NECK FINDINGS AORTIC ARCH: Visualized aortic arch of normal caliber with normal 3 vessel morphology. No hemodynamically significant stenosis seen about the origin of the great vessels. Visualized subclavian arteries widely patent.  RIGHT CAROTID SYSTEM: Right common carotid artery widely patent from its origin to the bifurcation without stenosis.  No significant atheromatous narrowing about the right bifurcation. Right ICA widely patent distally to the skull base without stenosis or vascular occlusion. LEFT CAROTID SYSTEM: Left common carotid artery widely patent from its origin to the bifurcation. 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 Left ICA remains occluded to the circle-of-Willis. VERTEBRAL ARTERIES: Both vertebral arteries arise from the subclavian arteries. Right vertebral artery dominant and widely patent to the skull base. Multifocal moderate to severe stenoses seen involving the proximal left V1 segment, likely atherosclerotic (series 1046, image 13). Left vertebral artery otherwise widely patent distally without stenosis or vascular occlusion. Incidental note made of a 3 cm right thyroid nodule, indeterminate. IMPRESSION: MRI HEAD IMPRESSION: 1. Patchy multifocal acute to early subacute ischemic left MCA territory infarcts involving the left insular cortex, left basal ganglia, and overlying left frontal and temporal lobes as above. No significant mass effect. 2. Associated petechial hemorrhage at the left lentiform nucleus without frank hemorrhagic transformation. 3. Abnormal flow void within the left ICA to the level of the terminus, consistent with occlusion. See below. 4. Underlying mild chronic microvascular ischemic disease. MRA HEAD IMPRESSION: 1. Motion degraded exam. 2. 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 Left MCA and its branches are perfused via collateral flow across the circle-of-Willis and/or from the posterior circulation. 3. Otherwise grossly negative MRA of the head. No other large vessel occlusion. No hemodynamically significant or correctable stenosis identified. MRA NECK IMPRESSION: 1. 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 2. Wide patency of the right carotid artery system within the neck. 3. 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 Left vertebral artery otherwise widely patent as is the dominant right vertebral artery. 4. Approximate 3 cm right thyroid nodule, indeterminate. Follow-up examination with dedicated thyroid ultrasound recommended for further evaluation. This could be performed on a nonemergent outpatient basis. Electronically Signed   By: Jeannine Boga M.D.   On: 05/17/2019 04:27   MR ANGIO NECK W WO CONTRAST  Result Date: 05/17/2019 CLINICAL DATA:  Initial evaluation for acute stroke, right-sided weakness, dysarthria. EXAM: MRI HEAD WITHOUT CONTRAST MRA HEAD WITHOUT CONTRAST MRA NECK WITHOUT AND WITH CONTRAST TECHNIQUE: Multiplanar, multiecho pulse sequences of the brain and surrounding structures were obtained without intravenous contrast. Angiographic images of the Circle of Willis were obtained using MRA technique without intravenous contrast. Angiographic images of the neck were obtained using MRA technique without and with intravenous contrast. Carotid stenosis measurements (when applicable) are obtained utilizing NASCET criteria, using the distal internal carotid diameter as the denominator. CONTRAST:  7.12mL GADAVIST GADOBUTROL 1 MMOL/ML IV SOLN COMPARISON:  Prior head CT from earlier the same day. FINDINGS: MRI HEAD FINDINGS Brain: Examination mildly degraded by motion artifact. Diffuse prominence of the CSF containing spaces compatible with generalized age-related cerebral atrophy. Patchy T2/FLAIR hyperintensity within the periventricular deep white matter both cerebral hemispheres most consistent with chronic small vessel ischemic disease, mild for age. Multifocal areas of restricted diffusion involving the left insular cortex, left basal ganglia, with a few additional patchy cortical infarcts within the overlying left frontal and temporal lobes, consistent with  left MCA territory infarcts. These are acute to early subacute in appearance with associated T2/FLAIR signal abnormality. Associated petechial hemorrhage at the level of the left lentiform nucleus without frank hemorrhagic transformation. Diffusion abnormality/edema extending into the left cerebral peduncle and midbrain indicative of involvement of the underlying corticospinal tract. No  significant mass effect. No other evidence for acute or subacute ischemia. Gray-white matter differentiation otherwise maintained. No encephalomalacia to suggest chronic cortical infarction elsewhere within the brain. No other foci of susceptibility artifact to suggest acute or chronic intracranial hemorrhage. No mass lesion, midline shift or mass effect. No hydrocephalus. No extra-axial fluid collection. Pituitary gland within normal limits. Midline structures intact. Vascular: Loss of normal flow void within the left ICA to the level of the terminus, which could be related to slow flow and/or occlusion. Major intracranial vascular flow voids otherwise maintained. Skull and upper cervical spine: Craniocervical junction within normal limits. Bone marrow signal intensity normal. No scalp soft tissue abnormality. Sinuses/Orbits: Globes and orbital soft tissues within normal limits. Mild scattered mucosal thickening noted within the ethmoidal sinuses. Small left maxillary sinus retention cyst noted. No significant mastoid effusion. Inner ear structures grossly normal. Other: None. MRA HEAD FINDINGS ANTERIOR CIRCULATION: Examination moderately degraded by motion artifact. 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 Distal cervical segment of the right ICA widely patent with antegrade flow. Petrous, cavernous, and supraclinoid segments demonstrate atheromatous irregularity but are patent without definite flow-limiting stenosis. Both A1 segments widely patent. Normal anterior communicating artery. Anterior cerebral arteries patent  to their distal aspects without stenosis. Right M1 widely patent. Perfusion of the left M1 via collateral flow across the circle-of-Willis and/or from the posterior circulation. Left M1 widely patent as well. Grossly negative MCA bifurcations. Distal MCA branches well perfused. Right MCA branches somewhat attenuated as compared to the contralateral left. POSTERIOR CIRCULATION: Vertebral arteries widely patent to the vertebrobasilar junction without stenosis. Right vertebral artery dominant. Posterior inferior cerebral arteries patent bilaterally. Basilar widely patent to its distal aspect without stenosis. Superior cerebral arteries patent bilaterally. PCA supplied via the basilar as well as robust bilateral posterior communicating arteries. Both PCAs well perfused to their distal aspects without stenosis. No intracranial aneurysm. MRA NECK FINDINGS AORTIC ARCH: Visualized aortic arch of normal caliber with normal 3 vessel morphology. No hemodynamically significant stenosis seen about the origin of the great vessels. Visualized subclavian arteries widely patent. RIGHT CAROTID SYSTEM: Right common carotid artery widely patent from its origin to the bifurcation without stenosis. No significant atheromatous narrowing about the right bifurcation. Right ICA widely patent distally to the skull base without stenosis or vascular occlusion. LEFT CAROTID SYSTEM: Left common carotid artery widely patent from its origin to the bifurcation. 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 Left ICA remains occluded to the circle-of-Willis. VERTEBRAL ARTERIES: Both vertebral arteries arise from the subclavian arteries. Right vertebral artery dominant and widely patent to the skull base. Multifocal moderate to severe stenoses seen involving the proximal left V1 segment, likely atherosclerotic (series 1046, image 13). Left vertebral artery otherwise widely patent distally without stenosis or vascular  occlusion. Incidental note made of a 3 cm right thyroid nodule, indeterminate. IMPRESSION: MRI HEAD IMPRESSION: 1. Patchy multifocal acute to early subacute ischemic left MCA territory infarcts involving the left insular cortex, left basal ganglia, and overlying left frontal and temporal lobes as above. No significant mass effect. 2. Associated petechial hemorrhage at the left lentiform nucleus without frank hemorrhagic transformation. 3. Abnormal flow void within the left ICA to the level of the terminus, consistent with occlusion. See below. 4. Underlying mild chronic microvascular ischemic disease. MRA HEAD IMPRESSION: 1. Motion degraded exam. 2. 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 Left MCA and its branches are perfused  via collateral flow across the circle-of-Willis and/or from the posterior circulation. 3. Otherwise grossly negative MRA of the head. No other large vessel occlusion. No hemodynamically significant or correctable stenosis identified. MRA NECK IMPRESSION: 1. 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 2. Wide patency of the right carotid artery system within the neck. 3. 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 Left vertebral artery otherwise widely patent as is the dominant right vertebral artery. 4. Approximate 3 cm right thyroid nodule, indeterminate. Follow-up examination with dedicated thyroid ultrasound recommended for further evaluation. This could be performed on a nonemergent outpatient basis. Electronically Signed   By: Jeannine Boga M.D.   On: 05/17/2019 04:27   MR BRAIN WO CONTRAST  Result Date: 05/17/2019 CLINICAL DATA:  Initial evaluation for acute stroke, right-sided weakness, dysarthria. EXAM: MRI HEAD WITHOUT CONTRAST MRA HEAD WITHOUT CONTRAST MRA NECK WITHOUT AND WITH CONTRAST TECHNIQUE: Multiplanar, multiecho pulse sequences of the brain and surrounding structures were obtained without  intravenous contrast. Angiographic images of the Circle of Willis were obtained using MRA technique without intravenous contrast. Angiographic images of the neck were obtained using MRA technique without and with intravenous contrast. Carotid stenosis measurements (when applicable) are obtained utilizing NASCET criteria, using the distal internal carotid diameter as the denominator. CONTRAST:  7.33mL GADAVIST GADOBUTROL 1 MMOL/ML IV SOLN COMPARISON:  Prior head CT from earlier the same day. FINDINGS: MRI HEAD FINDINGS Brain: Examination mildly degraded by motion artifact. Diffuse prominence of the CSF containing spaces compatible with generalized age-related cerebral atrophy. Patchy T2/FLAIR hyperintensity within the periventricular deep white matter both cerebral hemispheres most consistent with chronic small vessel ischemic disease, mild for age. Multifocal areas of restricted diffusion involving the left insular cortex, left basal ganglia, with a few additional patchy cortical infarcts within the overlying left frontal and temporal lobes, consistent with left MCA territory infarcts. These are acute to early subacute in appearance with associated T2/FLAIR signal abnormality. Associated petechial hemorrhage at the level of the left lentiform nucleus without frank hemorrhagic transformation. Diffusion abnormality/edema extending into the left cerebral peduncle and midbrain indicative of involvement of the underlying corticospinal tract. No significant mass effect. No other evidence for acute or subacute ischemia. Gray-white matter differentiation otherwise maintained. No encephalomalacia to suggest chronic cortical infarction elsewhere within the brain. No other foci of susceptibility artifact to suggest acute or chronic intracranial hemorrhage. No mass lesion, midline shift or mass effect. No hydrocephalus. No extra-axial fluid collection. Pituitary gland within normal limits. Midline structures intact. Vascular:  Loss of normal flow void within the left ICA to the level of the terminus, which could be related to slow flow and/or occlusion. Major intracranial vascular flow voids otherwise maintained. Skull and upper cervical spine: Craniocervical junction within normal limits. Bone marrow signal intensity normal. No scalp soft tissue abnormality. Sinuses/Orbits: Globes and orbital soft tissues within normal limits. Mild scattered mucosal thickening noted within the ethmoidal sinuses. Small left maxillary sinus retention cyst noted. No significant mastoid effusion. Inner ear structures grossly normal. Other: None. MRA HEAD FINDINGS ANTERIOR CIRCULATION: Examination moderately degraded by motion artifact. 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 Distal cervical segment of the right ICA widely patent with antegrade flow. Petrous, cavernous, and supraclinoid segments demonstrate atheromatous irregularity but are patent without definite flow-limiting stenosis. Both A1 segments widely patent. Normal anterior communicating artery. Anterior cerebral arteries patent to their distal aspects without stenosis. Right M1 widely patent. Perfusion of the left  M1 via collateral flow across the circle-of-Willis and/or from the posterior circulation. Left M1 widely patent as well. Grossly negative MCA bifurcations. Distal MCA branches well perfused. Right MCA branches somewhat attenuated as compared to the contralateral left. POSTERIOR CIRCULATION: Vertebral arteries widely patent to the vertebrobasilar junction without stenosis. Right vertebral artery dominant. Posterior inferior cerebral arteries patent bilaterally. Basilar widely patent to its distal aspect without stenosis. Superior cerebral arteries patent bilaterally. PCA supplied via the basilar as well as robust bilateral posterior communicating arteries. Both PCAs well perfused to their distal aspects without stenosis. No intracranial aneurysm. MRA NECK FINDINGS AORTIC ARCH:  Visualized aortic arch of normal caliber with normal 3 vessel morphology. No hemodynamically significant stenosis seen about the origin of the great vessels. Visualized subclavian arteries widely patent. RIGHT CAROTID SYSTEM: Right common carotid artery widely patent from its origin to the bifurcation without stenosis. No significant atheromatous narrowing about the right bifurcation. Right ICA widely patent distally to the skull base without stenosis or vascular occlusion. LEFT CAROTID SYSTEM: Left common carotid artery widely patent from its origin to the bifurcation. 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 Left ICA remains occluded to the circle-of-Willis. VERTEBRAL ARTERIES: Both vertebral arteries arise from the subclavian arteries. Right vertebral artery dominant and widely patent to the skull base. Multifocal moderate to severe stenoses seen involving the proximal left V1 segment, likely atherosclerotic (series 1046, image 13). Left vertebral artery otherwise widely patent distally without stenosis or vascular occlusion. Incidental note made of a 3 cm right thyroid nodule, indeterminate. IMPRESSION: MRI HEAD IMPRESSION: 1. Patchy multifocal acute to early subacute ischemic left MCA territory infarcts involving the left insular cortex, left basal ganglia, and overlying left frontal and temporal lobes as above. No significant mass effect. 2. Associated petechial hemorrhage at the left lentiform nucleus without frank hemorrhagic transformation. 3. Abnormal flow void within the left ICA to the level of the terminus, consistent with occlusion. See below. 4. Underlying mild chronic microvascular ischemic disease. MRA HEAD IMPRESSION: 1. Motion degraded exam. 2. 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 Left MCA and its branches are perfused via collateral flow across the circle-of-Willis and/or from the posterior circulation. 3. Otherwise grossly negative MRA  of the head. No other large vessel occlusion. No hemodynamically significant or correctable stenosis identified. MRA NECK IMPRESSION: 1. 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 2. Wide patency of the right carotid artery system within the neck. 3. 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 Left vertebral artery otherwise widely patent as is the dominant right vertebral artery. 4. Approximate 3 cm right thyroid nodule, indeterminate. Follow-up examination with dedicated thyroid ultrasound recommended for further evaluation. This could be performed on a nonemergent outpatient basis. Electronically Signed   By: Jeannine Boga M.D.   On: 05/17/2019 04:27   DG Chest Portable 1 View  Result Date: 05/17/2019 CLINICAL DATA:  Slurred speech, right-sided weakness EXAM: PORTABLE CHEST 1 VIEW COMPARISON:  None. FINDINGS: Mild hyperinflation of the lungs. Heart and mediastinal contours are within normal limits. No focal opacities or effusions. No acute bony abnormality. IMPRESSION: No active disease. Electronically Signed   By: Rolm Baptise M.D.   On: 05/17/2019 00:08     ASSESSMENT AND PLAN  74 y.o. male with past medical history significant for diabetes mellitus presents to the emergency department with right-sided weakness and dysarthria, also has mild expressive  aphasia.  Stroke onset likely 2 days ago, however suspect he may have had milderstrokes for 1 to 2 weeks.  Vascular imaging reveals an occluded left ICA with reconstitution at the MCA.   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  If not fully occluded may be a candidate for CEA/stenting. #Transthoracic Echo  # Start patient on ASA 325mg  daily #Start or continue Atorvastatin 80 mg/other high intensity  statin # BP goal: permissive HTN upto 185/110 mmHg # HBAIC and Lipid profile # Telemetry monitoring # Frequent neuro checks # Stroke swallow screen  Please page stroke NP  Or  PA  Or MD from 8am -4 pm  as this patient from this time will be  followed by the stroke.   You can look them up on [email or web address]  Password [facility]   Sharin Altidor [facility] Pager Number [phone]

## 2019-05-17 NOTE — Plan of Care (Addendum)
I was called by patient's RN regarding patient's right-sided weakness possibly being worse than in the morning. Recent stroke precludes any TPA or acute intervention. Presentation likely due to fluctuating nature of the BG/IC strokes.   Recommendations: -Repeat CT head.  Call neurology if there is any acute finding. -Allow permissive HTN - treat only if systolic [id number] -Other recommendations as per the stroke neurology consultations from this morning.  -- Amie Portland, MD [facility] Pager: [phone] If 7pm to 7am, please call on call as listed on AMION.   ADDENDUM Repeat CTH stable with no extension of stroke and no evidence of bleed.  -- Amie Portland, MD [facility] Pager: [phone] If 7pm to 7am, please call on call as listed on AMION.

## 2019-05-18 DIAGNOSIS — E119 Type 2 diabetes mellitus without complications: Secondary | ICD-10-CM

## 2019-05-18 LAB — GLUCOSE, CAPILLARY
Glucose-Capillary: 113 mg/dL — ABNORMAL HIGH (ref 70–99)
Glucose-Capillary: 114 mg/dL — ABNORMAL HIGH (ref 70–99)
Glucose-Capillary: 90 mg/dL (ref 70–99)
Glucose-Capillary: 99 mg/dL (ref 70–99)

## 2019-05-18 MED ORDER — SODIUM CHLORIDE 0.9 % IV SOLN: INTRAVENOUS | Status: DC

## 2019-05-18 MED ORDER — THIAMINE HCL 100 MG PO TABS
100.0000 mg | ORAL_TABLET | Freq: Every day | ORAL | Status: DC
  Administered 2019-05-18 – 2019-05-20 (×3): 100 mg via ORAL
  Filled 2019-05-18 (×3): qty 1

## 2019-05-18 NOTE — Progress Notes (Signed)
Initial Nutrition Assessment  INTERVENTION:   -Ensure Enlive po BID, each supplement provides 350 kcal and 20 grams of protein -Magic cup BID with meals, each supplement provides 290 kcal and 9 grams of protein  NUTRITION DIAGNOSIS:   Increased nutrient needs related to acute illness as evidenced by estimated needs.  GOAL:   Patient will meet greater than or equal to 90% of their needs  MONITOR:   PO intake, Supplement acceptance, Labs, Weight trends, I & O's  REASON FOR ASSESSMENT:   Malnutrition Screening Tool    ASSESSMENT:   74 y.o. male with medical history significant for diabetes mellitus, carotid occlusion, tobacco, alcohol and cannabis use, noncompliance presented to the emergency room with right-sided weakness and dysarthria.  **RD working remotely**  Pt is s/p acute stroke. Pt with mild dysarthria, aphasia, left hemiparesis and left facial droop.  Pt currently consuming 75% of meals at this time. Ensure supplements have been ordered and he is accepting ~1 daily. Will add Magic cups with lunch/dinner meals for additional kcals and protein.  Per weight records in care everywhere, pt weighed ~167 lbs in 2018. Pt with minimal weight loss since that time.  Medications: Thiamine tablet Labs reviewed: CBGs: 110-113  NUTRITION - FOCUSED PHYSICAL EXAM:  Working remotely.  Diet Order:   Diet Order            Diet heart healthy/carb modified Room service appropriate? Yes with Assist; Fluid consistency: Thin  Diet effective now              EDUCATION NEEDS:   No education needs have been identified at this time  Skin:  Skin Assessment: Reviewed RN Assessment  Last BM:  1/26  Height:   Ht Readings from Last 1 Encounters:  05/16/19 6\' 3"  (1.905 m)    Weight:   Wt Readings from Last 1 Encounters:  05/16/19 73.5 kg    Ideal Body Weight:  89.1 kg  BMI:  Body mass index is 20.25 kg/m.  Estimated Nutritional Needs:   Kcal:  1850-2050  Protein:   85-95g  Fluid:  2L/day  Clayton Bibles, MS, RD, LDN Inpatient Clinical Dietitian Pager: [phone] After Hours Pager: [phone]

## 2019-05-18 NOTE — Progress Notes (Addendum)
Inpatient Rehab Admissions:  Inpatient Rehab Consult received.  I met with patient at the bedside for rehabilitation assessment and to discuss goals and expectations of an inpatient rehab admission.  I also spoke to his daughter, Solmon Ice, over the phone.  They are open to rehab in a post-acute setting.  Note that pt does have [employer] benefits (updated pre-service center so chart will reflect this).  Family unable to locate a medicare card for policy number.  [facility] does not contract with [facility] and so pt will need to f/u with a lower level of care through the [place] system.  Will let CM know.   AddendumBronson Curb may have located Medicare number.  Will f/u tomorrow.   Signed: Shann Medal, PT, DPT Admissions Coordinator [phone] 05/18/19  1:00 PM

## 2019-05-18 NOTE — Progress Notes (Signed)
PROGRESS NOTE    Aiven Schwieterman  [id number] DOB: 17-Dec-1945 DOA: 05/16/2019 PCP: Patient, No Pcp Per  Brief Narrative: Benyamin Nakamura is a 74 y.o. male with medical history significant for diabetes mellitus, carotid occlusion, tobacco, alcohol and cannabis use, noncompliance presented to the emergency room with right-sided weakness and dysarthria.  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 -  Patient presented with dysarthria and right-sided weakness, outside TPA window -CT head noted left insular cortex, basal ganglia and corona radiata infarct -MRI noted patchy left MCA territory infarcts and petechial hemorrhage of the left lentiform nucleus, left ICA with abnormal flow void consistent with occlusion -MRA head L ICA occlusion at terminus, MRA neck L ICA occlusion at origin/bifurcation. -LDL was 85, hemoglobin A1c 5.4  -2D echocardiogram noted EF of 60 to 65%, no cardiac source of embolus -He was not on any antiplatelet agents prior to admission, neurology recommends aspirin Plavix for 3 months followed by aspirin alone -PT OT eval completed -CIR recommended for rehabilitation  Diabetes mellitus -Hemoglobin A1c 5.4.   -Diet controlled, continue carb modified diet   Hyperkalemia -Likely erroneous lab/hemolyzed specimen on admission, resolved  Tobacco use. -Counseled  History of bladder cancer.  - Status post TURP.  Thyroid nodule -Needs follow-up and work-up for this  Alcohol use disorder -Counseled, add thiamine, no evidence of withdrawal  Code Status: full Family Communication:  Discussed with the patient, he is awaiting CIR Disposition Plan:  Awaiting CIR for rehabilitation  Consultants:    Neurology   Procedures:   Antimicrobials:    Subjective: -Overnight had some transient worsening deficits in the right, CT head stable, he tells me that it is back to his new baseline -Mild dysarthria, expressive aphasia Objective: Vitals:   05/17/19 0737 05/17/19 1530 05/17/19 2252 05/18/19 0838  BP: (!) 113/50 130/63 (!) 107/42 113/64  Pulse: 70 (!) 55 79 73  Resp: 16 16 20  (!) 23  Temp: 98.6 F (37 C)  98.2 F (36.8 C) 99 F (37.2 C)  TempSrc:      SpO2: 100% 100% 100% 99%  Weight:      Height:        Intake/Output Summary (Last 24 hours) at 05/18/2019 1115 Last data filed at 05/18/2019 1000 Gross per 24 hour  Intake 1078.75 ml  Output 200 ml  Net 878.75 ml   Filed Weights   05/16/19 2346  Weight: 73.5 kg    Examination:  General exam: Thinly built male laying in bed, awake alert, oriented to self and place, expressive aphasia noted Respiratory system: Poor air movement bilaterally  cardiovascular system: S1 & S2 heard, RRR.  Gastrointestinal system: Abdomen is nondistended, soft and nontender.Normal bowel sounds heard. Central nervous system: Dysarthria, expressive aphasia, right facial droop, mild right-sided deficits Extremities: No edema Skin: No rashes, lesions or ulcers Psychiatry: Flat affect   Data Reviewed:   CBC: Recent Labs  Lab 05/16/19 2347 05/17/19 0007  WBC 13.7*  --   NEUTROABS 9.3*  --   HGB 15.0 16.0  HCT 46.8 47.0  MCV 85.4  --   PLT 251  --    Basic Metabolic Panel: Recent Labs  Lab 05/17/19 0007 05/17/19 0612 05/17/19 0909 05/17/19 1239  NA 135 139  --   --  K 6.0* 3.3* 3.5 3.5  CL 102 101  --   --   CO2  --  26  --   --   GLUCOSE 90 92  --   --   BUN 31* 18  --   --   CREATININE 1.20 1.25*  --   --   CALCIUM  --  8.8*  --   --    GFR: Estimated Creatinine Clearance: 54.7 mL/min (A) (by C-G formula based on SCr of 1.25 mg/dL (H)). Liver Function Tests: Recent Labs  Lab 05/17/19 0612  AST 19  ALT 13   ALKPHOS 102  BILITOT 1.5*  PROT 6.2*  ALBUMIN 3.0*   No results for input(s): LIPASE, AMYLASE in the last 168 hours. No results for input(s): AMMONIA in the last 168 hours. Coagulation Profile: Recent Labs  Lab 05/17/19 0043  INR 1.1   Cardiac Enzymes: No results for input(s): CKTOTAL, CKMB, CKMBINDEX, TROPONINI in the last 168 hours. BNP (last 3 results) No results for input(s): PROBNP in the last 8760 hours. HbA1C: Recent Labs    05/17/19 0612  HGBA1C 5.4   CBG: Recent Labs  Lab 05/17/19 0738 05/17/19 1528 05/17/19 2100 05/18/19 0836  GLUCAP 83 70 110* 113*   Lipid Profile: Recent Labs    05/17/19 0612  CHOL 136  HDL 29*  LDLCALC 85  TRIG 110  CHOLHDL 4.7   Thyroid Function Tests: No results for input(s): TSH, T4TOTAL, FREET4, T3FREE, THYROIDAB in the last 72 hours. Anemia Panel: No results for input(s): VITAMINB12, FOLATE, FERRITIN, TIBC, IRON, RETICCTPCT in the last 72 hours. Urine analysis:    Component Value Date/Time   COLORURINE YELLOW 05/17/2019 [place] 05/17/2019 0219   LABSPEC 1.025 05/17/2019 0219   PHURINE 5.0 05/17/2019 0219   GLUCOSEU NEGATIVE 05/17/2019 0219   HGBUR NEGATIVE 05/17/2019 0219   BILIRUBINUR NEGATIVE 05/17/2019 0219   KETONESUR 5 (A) 05/17/2019 0219   PROTEINUR NEGATIVE 05/17/2019 0219   NITRITE NEGATIVE 05/17/2019 0219   LEUKOCYTESUR NEGATIVE 05/17/2019 0219   Sepsis Labs: @LABRCNTIP (procalcitonin:4,lacticidven:4)  ) Recent Results (from the past 240 hour(s))  SARS CORONAVIRUS 2 (TAT 6-24 HRS) Nasopharyngeal Nasopharyngeal Swab     Status: None   Collection Time: 05/17/19 12:03 AM   Specimen: Nasopharyngeal Swab  Result Value Ref Range Status   SARS Coronavirus 2 NEGATIVE NEGATIVE Final    Comment: (NOTE) SARS-CoV-2 target nucleic acids are NOT DETECTED. The SARS-CoV-2 RNA is generally detectable in upper and lower respiratory specimens during the acute phase of infection. Negative results do  not preclude SARS-CoV-2 infection, do not rule out co-infections with other pathogens, and should not be used as the sole basis for treatment or other patient management decisions. Negative results must be combined with clinical observations, patient history, and epidemiological information. The expected result is Negative. Fact Sheet for Patients: SugarRoll.be Fact Sheet for Healthcare Providers: https://www.woods-mathews.com/ This test is not yet approved or cleared by the [place] FDA and  has been authorized for detection and/or diagnosis of SARS-CoV-2 by FDA under an Emergency Use Authorization (EUA). This EUA will remain  in effect (meaning this test can be used) for the duration of the COVID-19 declaration under Section 56 4(b)(1) of the Act, 21 U.S.C. section 360bbb-3(b)(1), unless the authorization is terminated or revoked sooner. Performed at [facility], [place] [address] [place], [place] [address]          Radiology Studies: CT ANGIO HEAD W OR WO CONTRAST  Result Date: 05/17/2019 CLINICAL DATA:  Stroke follow-up. 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 EXAM: CT ANGIOGRAPHY HEAD AND NECK TECHNIQUE: Multidetector CT imaging of the head and neck was performed using the standard protocol during bolus administration of intravenous contrast. Multiplanar CT image reconstructions and MIPs were obtained to evaluate the vascular anatomy. Carotid stenosis measurements (when applicable) are obtained utilizing NASCET criteria, using the distal internal carotid diameter as the denominator. CONTRAST:  43mL OMNIPAQUE IOHEXOL 350 MG/ML SOLN COMPARISON:  Head MRI, head MRA, and neck MRA 05/17/2019 FINDINGS: CTA NECK FINDINGS Aortic arch: Standard 3 vessel aortic arch with mild atherosclerotic plaque. Widely patent arch vessel origins. Right carotid system: Patent with small volume soft plaque in the carotid bulb. No evidence  of significant stenosis or dissection. Left carotid system: Patent common and external carotid arteries. Thrombus at the ICA origin with complete occlusion of the ICA in the neck. Vertebral arteries: Patent with the right being moderately dominant. No evidence of significant stenosis or dissection allowing for artifact intermittently limiting assessment of the left V1 segment. Skeleton: Advanced disc degeneration throughout the cervical spine with exception of C2-3. Interbody ankylosis at C4-5 and C5-6. Other neck: 3.3 cm hypoattenuating right thyroid nodule. Upper chest: Mild centrilobular emphysema. Review of the MIP images confirms the above findings CTA HEAD FINDINGS Anterior circulation: The intracranial right ICA is patent with mild atherosclerotic plaque not resulting in significant stenosis. The intracranial left ICA is occluded proximally with distal reconstitution via the posterior communicating artery. ACAs and MCAs are patent without evidence of proximal branch occlusion or significant proximal stenosis. There is mild asymmetric attenuation of left MCA branch vessels. The left A1 segment is mildly hypoplastic. There is a patent anterior communicating artery. No aneurysm is identified. Posterior circulation: The intracranial vertebral arteries are widely patent to the basilar. Patent PICA and SCA origins are seen bilaterally. AICAs are small and not well evaluated. The basilar artery is widely patent. There are moderately large posterior communicating arteries bilaterally with hypoplasia of the right P1 segment. Both PCAs are patent without evidence of significant proximal stenosis. No aneurysm is identified. Venous sinuses: As permitted by contrast timing, patent. Anatomic variants: Robust posterior communicating arteries with hypoplastic right P1 segment. Mildly hypoplastic left A1 segment. Review of the MIP images confirms the above findings IMPRESSION: 1. Left ICA occlusion at its origin with  intracranial reconstitution via the posterior communicating artery. 2. Widely patent right carotid artery and vertebral arteries. 3. No significant intracranial stenosis. 4. 3.3 cm right thyroid nodule. Recommend thyroid US (ref: J Am Coll Radiol. 2015 Feb;12(2): 143-50). Electronically Signed   By: Logan Bores M.D.   On: 05/17/2019 11:13   CT HEAD WO CONTRAST  Result Date: 05/17/2019 CLINICAL DATA:  74 year old male with increased right side weakness. Left ICA occlusion. Left MCA territory infarcts on brain MRI earlier today. EXAM: CT HEAD WITHOUT CONTRAST TECHNIQUE: Contiguous axial images were obtained from the base of the skull through the vertex without intravenous contrast. COMPARISON:  Brain MRI 0256 hours today.  Head CT 0014 hours today. FINDINGS: Brain: Cytotoxic edema in the left insula and basal ganglia is slightly less apparent since 0014 hours today (series 2, image 16 now versus series 3, image 15 earlier). No associated hemorrhage by CT. No associated mass effect. No new area of infarction is evident by CT. Right hemisphere and posterior fossa gray-white matter differentiation remains within normal limits. No ventriculomegaly. No midline shift, mass effect, or evidence of intracranial  mass lesion. Vascular: Calcified atherosclerosis at the skull base. Skull: No acute osseous abnormality identified. Congenital incomplete ossification of the posterior C1 ring. Sinuses/Orbits: Visualized paranasal sinuses and mastoids are stable and well pneumatized. Several mucous retention cysts and small frontal sinus osteoma is again noted. Other: No acute orbit or scalp soft tissue findings. IMPRESSION: 1. No extension of the Left MCA infarct is evident since 0256 hours today. No hemorrhagic transformation by CT, and no mass effect. 2. No new intracranial abnormality. Electronically Signed   By: Genevie Ann M.D.   On: 05/17/2019 19:13   CT HEAD WO CONTRAST  Result Date: 05/17/2019 CLINICAL DATA:  Dizziness,  confusion, blurred vision EXAM: CT HEAD WITHOUT CONTRAST TECHNIQUE: Contiguous axial images were obtained from the base of the skull through the vertex without intravenous contrast. COMPARISON:  None. FINDINGS: Brain: There is low-density noted in the left insular cortex, extending into the left basal ganglia and corona radiata compatible with acute to subacute infarct. No hemorrhage. No midline shift. No hydrocephalus. Vascular: No hyperdense vessel or unexpected calcification. Skull: No acute calvarial abnormality. Sinuses/Orbits: Visualized paranasal sinuses and mastoids clear. Orbital soft tissues unremarkable. Other: None IMPRESSION: Infarct within the left insular cortex, left basal ganglia and corona radiata, likely acute to subacute. Electronically Signed   By: Rolm Baptise M.D.   On: 05/17/2019 00:23   CT ANGIO NECK W OR WO CONTRAST  Result Date: 05/17/2019 CLINICAL DATA:  Stroke follow-up. 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 EXAM: CT ANGIOGRAPHY HEAD AND NECK TECHNIQUE: Multidetector CT imaging of the head and neck was performed using the standard protocol during bolus administration of intravenous contrast. Multiplanar CT image reconstructions and MIPs were obtained to evaluate the vascular anatomy. Carotid stenosis measurements (when applicable) are obtained utilizing NASCET criteria, using the distal internal carotid diameter as the denominator. CONTRAST:  22mL OMNIPAQUE IOHEXOL 350 MG/ML SOLN COMPARISON:  Head MRI, head MRA, and neck MRA 05/17/2019 FINDINGS: CTA NECK FINDINGS Aortic arch: Standard 3 vessel aortic arch with mild atherosclerotic plaque. Widely patent arch vessel origins. Right carotid system: Patent with small volume soft plaque in the carotid bulb. No evidence of significant stenosis or dissection. Left carotid system: Patent common and external carotid arteries. Thrombus at the ICA origin with complete occlusion of the ICA in the neck. Vertebral arteries:  Patent with the right being moderately dominant. No evidence of significant stenosis or dissection allowing for artifact intermittently limiting assessment of the left V1 segment. Skeleton: Advanced disc degeneration throughout the cervical spine with exception of C2-3. Interbody ankylosis at C4-5 and C5-6. Other neck: 3.3 cm hypoattenuating right thyroid nodule. Upper chest: Mild centrilobular emphysema. Review of the MIP images confirms the above findings CTA HEAD FINDINGS Anterior circulation: The intracranial right ICA is patent with mild atherosclerotic plaque not resulting in significant stenosis. The intracranial left ICA is occluded proximally with distal reconstitution via the posterior communicating artery. ACAs and MCAs are patent without evidence of proximal branch occlusion or significant proximal stenosis. There is mild asymmetric attenuation of left MCA branch vessels. The left A1 segment is mildly hypoplastic. There is a patent anterior communicating artery. No aneurysm is identified. Posterior circulation: The intracranial vertebral arteries are widely patent to the basilar. Patent PICA and SCA origins are seen bilaterally. AICAs are small and not well evaluated. The basilar artery is widely patent. There are moderately large posterior communicating arteries bilaterally with hypoplasia of the right P1 segment. Both PCAs are patent without evidence of  significant proximal stenosis. No aneurysm is identified. Venous sinuses: As permitted by contrast timing, patent. Anatomic variants: Robust posterior communicating arteries with hypoplastic right P1 segment. Mildly hypoplastic left A1 segment. Review of the MIP images confirms the above findings IMPRESSION: 1. 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 2. Widely patent right carotid artery and vertebral arteries. 3. No significant intracranial stenosis. 4. 3.3 cm right thyroid nodule.  Recommend thyroid US (ref: J Am Coll Radiol. 2015 Feb;12(2): 143-50). Electronically Signed   By: Logan Bores M.D.   On: 05/17/2019 11:13   MR ANGIO HEAD WO CONTRAST  Result Date: 05/17/2019 CLINICAL DATA:  Initial evaluation for acute stroke, right-sided weakness, dysarthria. EXAM: MRI HEAD WITHOUT CONTRAST MRA HEAD WITHOUT CONTRAST MRA NECK WITHOUT AND WITH CONTRAST TECHNIQUE: Multiplanar, multiecho pulse sequences of the brain and surrounding structures were obtained without intravenous contrast. Angiographic images of the Circle of Willis were obtained using MRA technique without intravenous contrast. Angiographic images of the neck were obtained using MRA technique without and with intravenous contrast. Carotid stenosis measurements (when applicable) are obtained utilizing NASCET criteria, using the distal internal carotid diameter as the denominator. CONTRAST:  7.26mL GADAVIST GADOBUTROL 1 MMOL/ML IV SOLN COMPARISON:  Prior head CT from earlier the same day. FINDINGS: MRI HEAD FINDINGS Brain: Examination mildly degraded by motion artifact. Diffuse prominence of the CSF containing spaces compatible with generalized age-related cerebral atrophy. Patchy T2/FLAIR hyperintensity within the periventricular deep white matter both cerebral hemispheres most consistent with chronic small vessel ischemic disease, mild for age. Multifocal areas of restricted diffusion involving the left insular cortex, left basal ganglia, with a few additional patchy cortical infarcts within the overlying left frontal and temporal lobes, consistent with left MCA territory infarcts. These are acute to early subacute in appearance with associated T2/FLAIR signal abnormality. Associated petechial hemorrhage at the level of the left lentiform nucleus without frank hemorrhagic transformation. Diffusion abnormality/edema extending into the left cerebral peduncle and midbrain indicative of involvement of the underlying corticospinal tract.  No significant mass effect. No other evidence for acute or subacute ischemia. Gray-white matter differentiation otherwise maintained. No encephalomalacia to suggest chronic cortical infarction elsewhere within the brain. No other foci of susceptibility artifact to suggest acute or chronic intracranial hemorrhage. No mass lesion, midline shift or mass effect. No hydrocephalus. No extra-axial fluid collection. Pituitary gland within normal limits. Midline structures intact. Vascular: Loss of normal flow void within the left ICA to the level of the terminus, which could be related to slow flow and/or occlusion. Major intracranial vascular flow voids otherwise maintained. Skull and upper cervical spine: Craniocervical junction within normal limits. Bone marrow signal intensity normal. No scalp soft tissue abnormality. Sinuses/Orbits: Globes and orbital soft tissues within normal limits. Mild scattered mucosal thickening noted within the ethmoidal sinuses. Small left maxillary sinus retention cyst noted. No significant mastoid effusion. Inner ear structures grossly normal. Other: None. MRA HEAD FINDINGS ANTERIOR CIRCULATION: Examination moderately degraded by motion artifact. 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 Distal cervical segment of the right ICA widely patent with antegrade flow. Petrous, cavernous, and supraclinoid segments demonstrate atheromatous irregularity but are patent without definite flow-limiting stenosis. Both A1 segments widely patent. Normal anterior communicating artery. Anterior cerebral arteries patent to their distal aspects without stenosis. Right M1 widely patent. Perfusion of the left M1 via collateral flow across the circle-of-Willis and/or from the posterior circulation. Left M1 widely patent as well. Grossly negative MCA bifurcations. Distal  MCA branches well perfused. Right MCA branches somewhat attenuated as compared to the contralateral left. POSTERIOR CIRCULATION:  Vertebral arteries widely patent to the vertebrobasilar junction without stenosis. Right vertebral artery dominant. Posterior inferior cerebral arteries patent bilaterally. Basilar widely patent to its distal aspect without stenosis. Superior cerebral arteries patent bilaterally. PCA supplied via the basilar as well as robust bilateral posterior communicating arteries. Both PCAs well perfused to their distal aspects without stenosis. No intracranial aneurysm. MRA NECK FINDINGS AORTIC ARCH: Visualized aortic arch of normal caliber with normal 3 vessel morphology. No hemodynamically significant stenosis seen about the origin of the great vessels. Visualized subclavian arteries widely patent. RIGHT CAROTID SYSTEM: Right common carotid artery widely patent from its origin to the bifurcation without stenosis. No significant atheromatous narrowing about the right bifurcation. Right ICA widely patent distally to the skull base without stenosis or vascular occlusion. LEFT CAROTID SYSTEM: Left common carotid artery widely patent from its origin to the bifurcation. 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 Left ICA remains occluded to the circle-of-Willis. VERTEBRAL ARTERIES: Both vertebral arteries arise from the subclavian arteries. Right vertebral artery dominant and widely patent to the skull base. Multifocal moderate to severe stenoses seen involving the proximal left V1 segment, likely atherosclerotic (series 1046, image 13). Left vertebral artery otherwise widely patent distally without stenosis or vascular occlusion. Incidental note made of a 3 cm right thyroid nodule, indeterminate. IMPRESSION: MRI HEAD IMPRESSION: 1. Patchy multifocal acute to early subacute ischemic left MCA territory infarcts involving the left insular cortex, left basal ganglia, and overlying left frontal and temporal lobes as above. No significant mass effect. 2. Associated petechial hemorrhage at the left  lentiform nucleus without frank hemorrhagic transformation. 3. Abnormal flow void within the left ICA to the level of the terminus, consistent with occlusion. See below. 4. Underlying mild chronic microvascular ischemic disease. MRA HEAD IMPRESSION: 1. Motion degraded exam. 2. 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 Left MCA and its branches are perfused via collateral flow across the circle-of-Willis and/or from the posterior circulation. 3. Otherwise grossly negative MRA of the head. No other large vessel occlusion. No hemodynamically significant or correctable stenosis identified. MRA NECK IMPRESSION: 1. 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 2. Wide patency of the right carotid artery system within the neck. 3. 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 Left vertebral artery otherwise widely patent as is the dominant right vertebral artery. 4. Approximate 3 cm right thyroid nodule, indeterminate. Follow-up examination with dedicated thyroid ultrasound recommended for further evaluation. This could be performed on a nonemergent outpatient basis. Electronically Signed   By: Jeannine Boga M.D.   On: 05/17/2019 04:27   MR ANGIO NECK W WO CONTRAST  Result Date: 05/17/2019 CLINICAL DATA:  Initial evaluation for acute stroke, right-sided weakness, dysarthria. EXAM: MRI HEAD WITHOUT CONTRAST MRA HEAD WITHOUT CONTRAST MRA NECK WITHOUT AND WITH CONTRAST TECHNIQUE: Multiplanar, multiecho pulse sequences of the brain and surrounding structures were obtained without intravenous contrast. Angiographic images of the Circle of Willis were obtained using MRA technique without intravenous contrast. Angiographic images of the neck were obtained using MRA technique without and with intravenous contrast. Carotid stenosis measurements (when applicable) are obtained utilizing NASCET criteria, using the distal internal carotid diameter as the  denominator. CONTRAST:  7.74mL GADAVIST GADOBUTROL 1 MMOL/ML IV SOLN COMPARISON:  Prior head CT from earlier the same day.  FINDINGS: MRI HEAD FINDINGS Brain: Examination mildly degraded by motion artifact. Diffuse prominence of the CSF containing spaces compatible with generalized age-related cerebral atrophy. Patchy T2/FLAIR hyperintensity within the periventricular deep white matter both cerebral hemispheres most consistent with chronic small vessel ischemic disease, mild for age. Multifocal areas of restricted diffusion involving the left insular cortex, left basal ganglia, with a few additional patchy cortical infarcts within the overlying left frontal and temporal lobes, consistent with left MCA territory infarcts. These are acute to early subacute in appearance with associated T2/FLAIR signal abnormality. Associated petechial hemorrhage at the level of the left lentiform nucleus without frank hemorrhagic transformation. Diffusion abnormality/edema extending into the left cerebral peduncle and midbrain indicative of involvement of the underlying corticospinal tract. No significant mass effect. No other evidence for acute or subacute ischemia. Gray-white matter differentiation otherwise maintained. No encephalomalacia to suggest chronic cortical infarction elsewhere within the brain. No other foci of susceptibility artifact to suggest acute or chronic intracranial hemorrhage. No mass lesion, midline shift or mass effect. No hydrocephalus. No extra-axial fluid collection. Pituitary gland within normal limits. Midline structures intact. Vascular: Loss of normal flow void within the left ICA to the level of the terminus, which could be related to slow flow and/or occlusion. Major intracranial vascular flow voids otherwise maintained. Skull and upper cervical spine: Craniocervical junction within normal limits. Bone marrow signal intensity normal. No scalp soft tissue abnormality. Sinuses/Orbits: Globes and orbital  soft tissues within normal limits. Mild scattered mucosal thickening noted within the ethmoidal sinuses. Small left maxillary sinus retention cyst noted. No significant mastoid effusion. Inner ear structures grossly normal. Other: None. MRA HEAD FINDINGS ANTERIOR CIRCULATION: Examination moderately degraded by motion artifact. 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 Distal cervical segment of the right ICA widely patent with antegrade flow. Petrous, cavernous, and supraclinoid segments demonstrate atheromatous irregularity but are patent without definite flow-limiting stenosis. Both A1 segments widely patent. Normal anterior communicating artery. Anterior cerebral arteries patent to their distal aspects without stenosis. Right M1 widely patent. Perfusion of the left M1 via collateral flow across the circle-of-Willis and/or from the posterior circulation. Left M1 widely patent as well. Grossly negative MCA bifurcations. Distal MCA branches well perfused. Right MCA branches somewhat attenuated as compared to the contralateral left. POSTERIOR CIRCULATION: Vertebral arteries widely patent to the vertebrobasilar junction without stenosis. Right vertebral artery dominant. Posterior inferior cerebral arteries patent bilaterally. Basilar widely patent to its distal aspect without stenosis. Superior cerebral arteries patent bilaterally. PCA supplied via the basilar as well as robust bilateral posterior communicating arteries. Both PCAs well perfused to their distal aspects without stenosis. No intracranial aneurysm. MRA NECK FINDINGS AORTIC ARCH: Visualized aortic arch of normal caliber with normal 3 vessel morphology. No hemodynamically significant stenosis seen about the origin of the great vessels. Visualized subclavian arteries widely patent. RIGHT CAROTID SYSTEM: Right common carotid artery widely patent from its origin to the bifurcation without stenosis. No significant atheromatous narrowing about the right  bifurcation. Right ICA widely patent distally to the skull base without stenosis or vascular occlusion. LEFT CAROTID SYSTEM: Left common carotid artery widely patent from its origin to the bifurcation. 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 Left ICA remains occluded to the circle-of-Willis. VERTEBRAL ARTERIES: Both vertebral arteries arise from the subclavian arteries. Right vertebral artery dominant and widely patent to the skull base. Multifocal moderate to severe stenoses seen involving the proximal left V1 segment, likely atherosclerotic (series  1046, image 13). Left vertebral artery otherwise widely patent distally without stenosis or vascular occlusion. Incidental note made of a 3 cm right thyroid nodule, indeterminate. IMPRESSION: MRI HEAD IMPRESSION: 1. Patchy multifocal acute to early subacute ischemic left MCA territory infarcts involving the left insular cortex, left basal ganglia, and overlying left frontal and temporal lobes as above. No significant mass effect. 2. Associated petechial hemorrhage at the left lentiform nucleus without frank hemorrhagic transformation. 3. Abnormal flow void within the left ICA to the level of the terminus, consistent with occlusion. See below. 4. Underlying mild chronic microvascular ischemic disease. MRA HEAD IMPRESSION: 1. Motion degraded exam. 2. 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 Left MCA and its branches are perfused via collateral flow across the circle-of-Willis and/or from the posterior circulation. 3. Otherwise grossly negative MRA of the head. No other large vessel occlusion. No hemodynamically significant or correctable stenosis identified. MRA NECK IMPRESSION: 1. 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 2. Wide patency of the right carotid artery system within the neck. 3. 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 Left vertebral artery  otherwise widely patent as is the dominant right vertebral artery. 4. Approximate 3 cm right thyroid nodule, indeterminate. Follow-up examination with dedicated thyroid ultrasound recommended for further evaluation. This could be performed on a nonemergent outpatient basis. Electronically Signed   By: Jeannine Boga M.D.   On: 05/17/2019 04:27   MR BRAIN WO CONTRAST  Result Date: 05/17/2019 CLINICAL DATA:  Initial evaluation for acute stroke, right-sided weakness, dysarthria. EXAM: MRI HEAD WITHOUT CONTRAST MRA HEAD WITHOUT CONTRAST MRA NECK WITHOUT AND WITH CONTRAST TECHNIQUE: Multiplanar, multiecho pulse sequences of the brain and surrounding structures were obtained without intravenous contrast. Angiographic images of the Circle of Willis were obtained using MRA technique without intravenous contrast. Angiographic images of the neck were obtained using MRA technique without and with intravenous contrast. Carotid stenosis measurements (when applicable) are obtained utilizing NASCET criteria, using the distal internal carotid diameter as the denominator. CONTRAST:  7.17mL GADAVIST GADOBUTROL 1 MMOL/ML IV SOLN COMPARISON:  Prior head CT from earlier the same day. FINDINGS: MRI HEAD FINDINGS Brain: Examination mildly degraded by motion artifact. Diffuse prominence of the CSF containing spaces compatible with generalized age-related cerebral atrophy. Patchy T2/FLAIR hyperintensity within the periventricular deep white matter both cerebral hemispheres most consistent with chronic small vessel ischemic disease, mild for age. Multifocal areas of restricted diffusion involving the left insular cortex, left basal ganglia, with a few additional patchy cortical infarcts within the overlying left frontal and temporal lobes, consistent with left MCA territory infarcts. These are acute to early subacute in appearance with associated T2/FLAIR signal abnormality. Associated petechial hemorrhage at the level of the left  lentiform nucleus without frank hemorrhagic transformation. Diffusion abnormality/edema extending into the left cerebral peduncle and midbrain indicative of involvement of the underlying corticospinal tract. No significant mass effect. No other evidence for acute or subacute ischemia. Gray-white matter differentiation otherwise maintained. No encephalomalacia to suggest chronic cortical infarction elsewhere within the brain. No other foci of susceptibility artifact to suggest acute or chronic intracranial hemorrhage. No mass lesion, midline shift or mass effect. No hydrocephalus. No extra-axial fluid collection. Pituitary gland within normal limits. Midline structures intact. Vascular: Loss of normal flow void within the left ICA to the level of the terminus, which could be related to slow flow and/or occlusion. Major intracranial vascular flow voids otherwise maintained. Skull and upper  cervical spine: Craniocervical junction within normal limits. Bone marrow signal intensity normal. No scalp soft tissue abnormality. Sinuses/Orbits: Globes and orbital soft tissues within normal limits. Mild scattered mucosal thickening noted within the ethmoidal sinuses. Small left maxillary sinus retention cyst noted. No significant mastoid effusion. Inner ear structures grossly normal. Other: None. MRA HEAD FINDINGS ANTERIOR CIRCULATION: Examination moderately degraded by motion artifact. 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 Distal cervical segment of the right ICA widely patent with antegrade flow. Petrous, cavernous, and supraclinoid segments demonstrate atheromatous irregularity but are patent without definite flow-limiting stenosis. Both A1 segments widely patent. Normal anterior communicating artery. Anterior cerebral arteries patent to their distal aspects without stenosis. Right M1 widely patent. Perfusion of the left M1 via collateral flow across the circle-of-Willis and/or from the posterior circulation.  Left M1 widely patent as well. Grossly negative MCA bifurcations. Distal MCA branches well perfused. Right MCA branches somewhat attenuated as compared to the contralateral left. POSTERIOR CIRCULATION: Vertebral arteries widely patent to the vertebrobasilar junction without stenosis. Right vertebral artery dominant. Posterior inferior cerebral arteries patent bilaterally. Basilar widely patent to its distal aspect without stenosis. Superior cerebral arteries patent bilaterally. PCA supplied via the basilar as well as robust bilateral posterior communicating arteries. Both PCAs well perfused to their distal aspects without stenosis. No intracranial aneurysm. MRA NECK FINDINGS AORTIC ARCH: Visualized aortic arch of normal caliber with normal 3 vessel morphology. No hemodynamically significant stenosis seen about the origin of the great vessels. Visualized subclavian arteries widely patent. RIGHT CAROTID SYSTEM: Right common carotid artery widely patent from its origin to the bifurcation without stenosis. No significant atheromatous narrowing about the right bifurcation. Right ICA widely patent distally to the skull base without stenosis or vascular occlusion. LEFT CAROTID SYSTEM: Left common carotid artery widely patent from its origin to the bifurcation. 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 Left ICA remains occluded to the circle-of-Willis. VERTEBRAL ARTERIES: Both vertebral arteries arise from the subclavian arteries. Right vertebral artery dominant and widely patent to the skull base. Multifocal moderate to severe stenoses seen involving the proximal left V1 segment, likely atherosclerotic (series 1046, image 13). Left vertebral artery otherwise widely patent distally without stenosis or vascular occlusion. Incidental note made of a 3 cm right thyroid nodule, indeterminate. IMPRESSION: MRI HEAD IMPRESSION: 1. Patchy multifocal acute to early subacute ischemic left MCA  territory infarcts involving the left insular cortex, left basal ganglia, and overlying left frontal and temporal lobes as above. No significant mass effect. 2. Associated petechial hemorrhage at the left lentiform nucleus without frank hemorrhagic transformation. 3. Abnormal flow void within the left ICA to the level of the terminus, consistent with occlusion. See below. 4. Underlying mild chronic microvascular ischemic disease. MRA HEAD IMPRESSION: 1. Motion degraded exam. 2. 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 Left MCA and its branches are perfused via collateral flow across the circle-of-Willis and/or from the posterior circulation. 3. Otherwise grossly negative MRA of the head. No other large vessel occlusion. No hemodynamically significant or correctable stenosis identified. MRA NECK IMPRESSION: 1. 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 2. Wide patency of the right carotid artery system within the neck. 3. 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 Left vertebral artery otherwise widely patent as is the dominant right vertebral artery. 4. Approximate 3 cm right thyroid nodule, indeterminate. Follow-up examination with dedicated thyroid  ultrasound recommended for further evaluation. This could be performed on a nonemergent outpatient basis. Electronically Signed   By: Jeannine Boga M.D.   On: 05/17/2019 04:27   DG Chest Portable 1 View  Result Date: 05/17/2019 CLINICAL DATA:  Slurred speech, right-sided weakness EXAM: PORTABLE CHEST 1 VIEW COMPARISON:  None. FINDINGS: Mild hyperinflation of the lungs. Heart and mediastinal contours are within normal limits. No focal opacities or effusions. No acute bony abnormality. IMPRESSION: No active disease. Electronically Signed   By: Rolm Baptise M.D.   On: 05/17/2019 00:08   ECHOCARDIOGRAM COMPLETE  Result Date: 05/17/2019   ECHOCARDIOGRAM REPORT   Patient Name:   CEDARIUS TEAGUE Date of Exam: 05/17/2019 Medical Rec #:  [id number]        Height:       75.0 in Accession #:    [id number]       Weight:       162.0 lb Date of Birth:  [birth year]         BSA:          2.01 m Patient Age:    51 years         BP:           113/50 mmHg Patient Gender: M                HR:           62 bpm. Exam Location:  Inpatient Procedure: 2D Echo, Color Doppler and Cardiac Doppler Indications:    Stroke  History:        Patient has no prior history of Echocardiogram examinations.                 Risk Factors:Diabetes.  Sonographer:    Raquel Sarna Senior RDCS Referring Phys: [id number] [place]  1. Left ventricular ejection fraction, by visual estimation, is 60 to 65%. The left ventricle has normal function. There is no left ventricular hypertrophy.  2. The left ventricle has no regional wall motion abnormalities.  3. Global right ventricle has normal systolic function.The right ventricular size is normal. No increase in right ventricular wall thickness.  4. Left atrial size was normal.  5. Right atrial size was normal.  6. The mitral valve is normal in structure. No evidence of mitral valve regurgitation. No evidence of mitral stenosis.  7. The tricuspid valve is normal in structure.  8. The tricuspid valve is normal in structure. Tricuspid valve regurgitation is trivial.  9. The aortic valve is normal in structure. Aortic valve regurgitation is not visualized. No evidence of aortic valve sclerosis or stenosis. 10. The pulmonic valve was normal in structure. Pulmonic valve regurgitation is not visualized. 11. Mildly elevated pulmonary artery systolic pressure. 12. The inferior vena cava is normal in size with greater than 50% respiratory variability, suggesting right atrial pressure of 3 mmHg. FINDINGS  Left Ventricle: Left ventricular ejection fraction, by visual estimation, is 60 to 65%. The left ventricle has normal function. The left ventricle has no regional wall motion abnormalities. The left  ventricular internal cavity size was the left ventricle is normal in size. There is no left ventricular hypertrophy. Left ventricular diastolic parameters were normal. Normal left atrial pressure. Right Ventricle: The right ventricular size is normal. No increase in right ventricular wall thickness. Global RV systolic function is has normal systolic function. The tricuspid regurgitant velocity is 2.76 m/s, and with an assumed right atrial pressure  of 3 mmHg, the estimated right ventricular  systolic pressure is mildly elevated at 33.5 mmHg. Left Atrium: Left atrial size was normal in size. Right Atrium: Right atrial size was normal in size Pericardium: There is no evidence of pericardial effusion. Mitral Valve: The mitral valve is normal in structure. No evidence of mitral valve regurgitation. No evidence of mitral valve stenosis by observation. Tricuspid Valve: The tricuspid valve is normal in structure. Tricuspid valve regurgitation is trivial. Aortic Valve: The aortic valve is normal in structure. Aortic valve regurgitation is not visualized. The aortic valve is structurally normal, with no evidence of sclerosis or stenosis. Pulmonic Valve: The pulmonic valve was normal in structure. Pulmonic valve regurgitation is not visualized. Pulmonic regurgitation is not visualized. Aorta: The aortic root, ascending aorta and aortic arch are all structurally normal, with no evidence of dilitation or obstruction. Venous: The inferior vena cava is normal in size with greater than 50% respiratory variability, suggesting right atrial pressure of 3 mmHg. IAS/Shunts: No atrial level shunt detected by color flow Doppler. There is no evidence of a patent foramen ovale. No ventricular septal defect is seen or detected. There is no evidence of an atrial septal defect.  LEFT VENTRICLE PLAX 2D LVIDd:         3.80 cm  Diastology LVIDs:         2.50 cm  LV e' lateral:   8.49 cm/s LV PW:         0.90 cm  LV E/e' lateral: 9.1 LV IVS:         1.00 cm  LV e' medial:    6.31 cm/s LVOT diam:     2.00 cm  LV E/e' medial:  12.2 LV SV:         40 ml LV SV Index:   20.21 LVOT Area:     3.14 cm  RIGHT VENTRICLE RV S prime:     13.40 cm/s TAPSE (M-mode): 2.1 cm LEFT ATRIUM             Index LA diam:        2.40 cm 1.20 cm/m LA Vol (A2C):   57.8 ml 28.81 ml/m LA Vol (A4C):   34.5 ml 17.19 ml/m LA Biplane Vol: 49.2 ml 24.52 ml/m  AORTIC VALVE LVOT Vmax:   97.50 cm/s LVOT Vmean:  62.900 cm/s LVOT VTI:    0.206 m  AORTA Ao Root diam: 2.30 cm MITRAL VALVE                        TRICUSPID VALVE MV Area (PHT): 2.95 cm             TR Peak grad:   30.5 mmHg MV PHT:        74.53 msec           TR Vmax:        276.00 cm/s MV Decel Time: 257 msec MV E velocity: 77.10 cm/s 103 cm/s  SHUNTS MV A velocity: 79.50 cm/s 70.3 cm/s Systemic VTI:  0.21 m MV E/A ratio:  0.97       1.5       Systemic Diam: 2.00 cm  Dani Gobble Croitoru MD Electronically signed by Sanda Klein MD Signature Date/Time: 05/17/2019/10:54:47 AM    Final         Scheduled Meds: . aspirin EC  325 mg Oral Daily  . atorvastatin  40 mg Oral q1800  . clopidogrel  75 mg Oral Daily  . enoxaparin (LOVENOX) injection  40 mg  Subcutaneous QHS  . feeding supplement (ENSURE ENLIVE)  237 mL Oral BID BM  . insulin aspart  0-5 Units Subcutaneous QHS  . insulin aspart  0-9 Units Subcutaneous TID WC   Continuous Infusions:   LOS: 1 day    Time spent: 32min    Domenic Polite, MD [facility]  05/18/2019, 11:15 AM

## 2019-05-18 NOTE — TOC Initial Note (Addendum)
Transition of Care [facility]) - Initial/Assessment Note    Patient Details  Name: Ruben Moore MRN: [id number] Date of Birth: May 04, 1945  [facility]) CM/SW Contact:    Pollie Friar, RN Phone Number: 05/18/2019, 3:58 PM  Clinical Narrative:                 TOC attempting to assist in finding pts medicare number. CM was able to speak to pts daughter over the phone and she found his medicare number. CM provided it to CIR. They continue to have issues pulling up the info. CM has also reached out to [facility] and financial counseling.  Substance abuse resources provided for inpt/ outpatient counseling. Pt voiced understanding. TOC following.  Medicare number: [id number]  Expected Discharge Plan: IP Rehab Facility Barriers to Discharge: Continued Medical Work up   Patient Goals and CMS Choice   CMS Medicare.gov Compare Post Acute Care list provided to:: Patient Choice offered to / list presented to : Patient  Expected Discharge Plan and Services Expected Discharge Plan: [place]   Discharge Planning Services: CM Consult Post Acute Care Choice: IP Rehab Living arrangements for the past 2 months: Apartment                                      Prior Living Arrangements/Services Living arrangements for the past 2 months: Apartment Lives with:: Self Patient language and need for interpreter reviewed:: Yes Do you feel safe going back to the place where you live?: Yes      Need for Family Participation in Patient Care: Yes (Comment) Care giver support system in place?: Yes (comment)(daughter states she can provided needed supervision)   Criminal Activity/Legal Involvement Pertinent to Current Situation/Hospitalization: No - Comment as needed  Activities of Daily Living Home Assistive Devices/Equipment: Eyeglasses, Dentures (specify type) ADL Screening (condition at time of admission) Patient's cognitive ability adequate to safely complete  daily activities?: Yes Is the patient deaf or have difficulty hearing?: No Does the patient have difficulty seeing, even when wearing glasses/contacts?: No Does the patient have difficulty concentrating, remembering, or making decisions?: Yes Patient able to express need for assistance with ADLs?: Yes Does the patient have difficulty dressing or bathing?: Yes Independently performs ADLs?: No Communication: Independent Dressing (OT): Needs assistance Is this a change from baseline?: Change from baseline, expected to last >3 days Grooming: Needs assistance Is this a change from baseline?: Change from baseline, expected to last >3 days Feeding: Needs assistance Is this a change from baseline?: Change from baseline, expected to last >3 days Bathing: Needs assistance Is this a change from baseline?: Change from baseline, expected to last >3 days Toileting: Needs assistance Is this a change from baseline?: Change from baseline, expected to last >3days In/Out Bed: Needs assistance Is this a change from baseline?: Change from baseline, expected to last >3 days Walks in Home: Needs assistance Is this a change from baseline?: Change from baseline, expected to last >3 days Does the patient have difficulty walking or climbing stairs?: Yes Weakness of Legs: Right Weakness of Arms/Hands: Right  Permission Sought/Granted                  Emotional Assessment Appearance:: Appears stated age Attitude/Demeanor/Rapport: Engaged Affect (typically observed): Accepting Orientation: : Oriented to Self, Oriented to Place, Oriented to  Time, Oriented to Situation Alcohol / Substance Use: Illicit Drugs(resources provided) Psych Involvement:  No (comment)  Admission diagnosis:  Acute ischemic stroke [facility]) [I63.9] Patient Active Problem List   Diagnosis Date Noted  . Acute ischemic stroke ([place]) 05/17/2019  . Hyperkalemia 05/17/2019  . Diabetes mellitus type II, non insulin dependent ([place])  05/17/2019  . Bladder cancer ([place])   . Tobacco use    PCP:  Patient, No Pcp Per Pharmacy:  No Pharmacies Listed    Social Determinants of Health (SDOH) Interventions    Readmission Risk Interventions No flowsheet data found.

## 2019-05-18 NOTE — Progress Notes (Signed)
Physical Therapy Treatment Patient Details Name: Ruben Moore MRN: [id number] DOB: 10/08/1945 Today's Date: 05/18/2019    History of Present Illness Pt is a 74 y/o male with PMH of DM presenting to ED with R sided weakness and dysarthria. MRI reveals "large left basal ganglia infarct as well as smaller acute and subacute infarctions in the left MCA territory infarcts including the insular cortex frontal and temporal lobes with mild petechial hemorrhage.     PT Comments    Patient progressing with ambulation distance and increased trials.  Use of RW able to participate with 1 person assist (second for chair follow for safety).  Tolerated treatment well and noted orthostatics WNL.  MD concerned due to episode of needing increased help yesterday after up to [facility] with nursing.  Feel he remains appropriate for follow up CIR level rehab.  Patient eager and participates well.  PT to follow acutely.   Follow Up Recommendations  CIR     Equipment Recommendations  Other (comment)(TBA)    Recommendations for Other Services       Precautions / Restrictions Precautions Precautions: Fall    Mobility  Bed Mobility Overal bed mobility: Needs Assistance             General bed mobility comments: up in chair  Transfers Overall transfer level: Needs assistance Equipment used: Rolling walker (2 wheeled) Transfers: Sit to/from Stand Sit to Stand: Min assist;+2 safety/equipment         General transfer comment: cues for hand placement for lifting, assist for rising  Ambulation/Gait Ambulation/Gait assistance: Mod assist;+2 safety/equipment Gait Distance (Feet): 70 Feet(x 2) Assistive device: Rolling walker (2 wheeled) Gait Pattern/deviations: Step-to pattern;Step-through pattern;Ataxic;Decreased step length - right;Scissoring;Drifts right/left;Narrow base of support     General Gait Details: cues for increased stride length, for R foot clearance and assist for balance, walker  management and safety, facilitation for increased L lateral weight shift for improved R foot clearance; seated rest in hallway with HR up to 103.   Stairs             Wheelchair Mobility    Modified Rankin (Stroke Patients Only) Modified Rankin (Stroke Patients Only) Pre-Morbid Rankin Score: No significant disability Modified Rankin: Moderately severe disability     Balance Overall balance assessment: Needs assistance Sitting-balance support: No upper extremity supported Sitting balance-Leahy Scale: Fair     Standing balance support: Bilateral upper extremity supported;During functional activity Standing balance-Leahy Scale: Poor Standing balance comment: relaint on BUE and external support; static standing for orthostatic BP with RW needs assist due to R lateral weight shift                            Cognition Arousal/Alertness: Awake/alert Behavior During Therapy: WFL for tasks assessed/performed Overall Cognitive Status: Impaired/Different from baseline Area of Impairment: Attention;Memory;Following commands;Safety/judgement;Problem solving                   Current Attention Level: Sustained Memory: Decreased short-term memory;Decreased recall of precautions Following Commands: Follows one step commands with increased time;Follows multi-step commands inconsistently Safety/Judgement: Decreased awareness of safety;Decreased awareness of deficits   Problem Solving: Slow processing;Difficulty sequencing;Requires verbal cues;Requires tactile cues        Exercises      General Comments        Pertinent Vitals/Pain Pain Assessment: No/denies pain    Home Living  Prior Function            PT Goals (current goals can now be found in the care plan section) Progress towards PT goals: Progressing toward goals    Frequency    Min 4X/week      PT Plan      Co-evaluation              AM-PAC PT "6  Clicks" Mobility   Outcome Measure  Help needed turning from your back to your side while in a flat bed without using bedrails?: A Little Help needed moving from lying on your back to sitting on the side of a flat bed without using bedrails?: A Little Help needed moving to and from a bed to a chair (including a wheelchair)?: A Little Help needed standing up from a chair using your arms (e.g., wheelchair or bedside chair)?: A Little Help needed to walk in hospital room?: A Lot Help needed climbing 3-5 steps with a railing? : Total 6 Click Score: 15    End of Session Equipment Utilized During Treatment: Gait belt Activity Tolerance: Patient tolerated treatment well Patient left: in chair;with call bell/phone within reach;with chair alarm set   PT Visit Diagnosis: Muscle weakness (generalized) (M62.81);Other abnormalities of gait and mobility (R26.89);Hemiplegia and hemiparesis Hemiplegia - Right/Left: Right Hemiplegia - dominant/non-dominant: Dominant Hemiplegia - caused by: Cerebral infarction     Time: [id number] PT Time Calculation (min) (ACUTE ONLY): 26 min  Charges:  $Gait Training: 8-22 mins $Therapeutic Activity: 8-22 mins                     Magda Kiel, [place] [facility] [phone] 05/18/2019    Reginia Naas 05/18/2019, 2:43 PM

## 2019-05-18 NOTE — Evaluation (Addendum)
Speech Language Pathology Evaluation Patient Details Name: Ruben Moore MRN: [id number] DOB: August 18, 1945 Today's Date: 05/18/2019 Time: [id number] SLP Time Calculation (min) (ACUTE ONLY): 24 min  Problem List:  Patient Active Problem List   Diagnosis Date Noted  . Acute ischemic stroke ([place]) 05/17/2019  . Hyperkalemia 05/17/2019  . Diabetes mellitus type II, non insulin dependent ([place]) 05/17/2019  . Bladder cancer ([place])   . Tobacco use    Past Medical History:  Past Medical History:  Diagnosis Date  . Bladder cancer ([place])   . Diabetes mellitus without complication ([place])   . Stroke ([place]) 05/16/2019  . Tobacco use    Past Surgical History: History reviewed. No pertinent surgical history. HPI:  Ruben Moore is a 74 y.o. right-handed male with history of bladder cancer, diabetes mellitus, tobacco abuse.  Per chart review patient lives alone independent prior to admission.  He is retired.  He does have a daughter in the area that works.  Presented 05/17/2019 with right-sided weakness and dysarthria.  Noted systolic blood pressure in the 150s.  SARS coronavirus negative, potassium 6.0, troponin negative, urine drug  screen positive marijuana, alcohol level negative.  Cranial CT scan showed infarction within the left insular cortex left basal ganglia corona radiata likely acute to subacute.  No midline shift.  Patient did not receive TPA.  MRI of the brain patchy multifocal acute to early subacute ischemia left MCA territory.  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  Echocardiogram pending.  CT angiogram of head and neck pending.  Neurology consulted presently on aspirin 325 mg daily for CVA prophylaxis and Lovenox for DVT prophylaxis.   Assessment / Plan / Recommendation Clinical Impression   Patient seen at bedside for speech/language evaluation. Patient presents with mild dysarthria, moderate expressive and mild receptive language impairments in the setting of  stroke. Patient with right facial droop. Patient's fluency reduced, speech is halting, mildly paraphasic. Confrontation naming task characterized by semantic paraphasias (calling a phone a "headphone," a window a "curtain," etc.) and verbal perseveration observed as well. Patient with impairment in repetition tasks, noted to verbally perseverate on words. Pt answering basic yes/no questions with 100% accuracy, but 50% accuracy on complex yes/no questions (partially suspected due to verbal perseveration). In descriptive naming task, patient able to name items in a picture scene, but demonstrated semantic paraphasias (calling a sink a stove, for example) that he was able to self correct independently in 25% of instances. Patient oriented to self, year, month, place ("hospital"), but stated city as [place] [employer] [place]. Patient stated his age as "74", attempted to self-correct, then stated "I don't know." Pt able to answer 1-2 step verbal commands, but demonstrated difficulty with more complex commands. Patient will benefit from ST to target speech/language impairments while inpatient and at the next venue of care. Recommend CIR at discharge.    SLP Assessment  SLP Recommendation/Assessment: Patient needs continued Speech Lanaguage Pathology Services SLP Visit Diagnosis: Aphasia (R47.01);Dysarthria and anarthria (R47.1)    Follow Up Recommendations       Frequency and Duration min 2x/week  2 weeks      SLP Evaluation Cognition  Overall Cognitive Status: Impaired/Different from baseline Arousal/Alertness: Awake/alert Orientation Level: Oriented to person;Oriented to time;Oriented to situation Attention: Sustained Sustained Attention: Appears intact       Comprehension  Auditory Comprehension Overall Auditory Comprehension: Impaired Yes/No Questions: Impaired Basic Biographical Questions: 76-100% accurate Basic Immediate Environment Questions: 75-100% accurate Complex Questions: 50-74%  accurate Commands: Impaired One Step Basic Commands: 75-100% accurate Two Step Basic Commands: 75-100% accurate Complex Commands: 25-49% accurate Conversation: Simple    Expression Expression Primary Mode of Expression: Verbal Verbal Expression Overall Verbal Expression: Impaired Initiation: Impaired Level of Generative/Spontaneous Verbalization: Phrase Repetition: Impaired Level of Impairment: Sentence level Naming: Impairment Confrontation: Impaired Convergent: 50-74% accurate Verbal Errors: Phonemic paraphasias;Semantic paraphasias;Perseveration Written Expression Dominant Hand: (Pt said left) Written Expression: Not tested   Oral / Motor  Motor Speech Intelligibility: Intelligibility reduced Word: 75-100% accurate Phrase: 75-100% accurate Sentence: 75-100% accurate Conversation: 75-100% accurate   GO                   Marina Goodell, M.Ed., CCC-SLP Speech Therapy Acute Rehabilitation [phone]: Acute Rehab office [phone] - pager   Richele Strand 05/18/2019, 9:50 AM

## 2019-05-18 NOTE — Progress Notes (Signed)
STROKE TEAM PROGRESS NOTE   INTERVAL HISTORY RN and PT at bedside. Pt yesterday afternoon had acute onset dizziness and worsening right sided weakness while on bedside commode. Stat CT no acute change. Overnight pt symptoms resumed and today he was able to work with PT walking in the hallway with walker. Orthostatic vital no orthostatic hypotension. BP soft at 110-130s.   Vitals:   05/17/19 0737 05/17/19 1530 05/17/19 2252 05/18/19 0838  BP: (!) 113/50 130/63 (!) 107/42 113/64  Pulse: 70 (!) 55 79 73  Resp: 16 16 20  (!) 23  Temp: 98.6 F (37 C)  98.2 F (36.8 C) 99 F (37.2 C)  TempSrc:      SpO2: 100% 100% 100% 99%  Weight:      Height:        CBC:  Recent Labs  Lab 05/16/19 2347 05/17/19 0007  WBC 13.7*  --   NEUTROABS 9.3*  --   HGB 15.0 16.0  HCT 46.8 47.0  MCV 85.4  --   PLT 251  --     Basic Metabolic Panel:  Recent Labs  Lab 05/17/19 0007 05/17/19 0007 05/17/19 0612 05/17/19 0612 05/17/19 0909 05/17/19 1239  NA 135  --  139  --   --   --   K 6.0*   < > 3.3*   < > 3.5 3.5  CL 102  --  101  --   --   --   CO2  --   --  26  --   --   --   GLUCOSE 90  --  92  --   --   --   BUN 31*  --  18  --   --   --   CREATININE 1.20  --  1.25*  --   --   --   CALCIUM  --   --  8.8*  --   --   --    < > = values in this interval not displayed.   Lipid Panel:     Component Value Date/Time   CHOL 136 05/17/2019 0612   TRIG 110 05/17/2019 0612   HDL 29 (L) 05/17/2019 0612   CHOLHDL 4.7 05/17/2019 0612   VLDL 22 05/17/2019 0612   LDLCALC 85 05/17/2019 0612   HgbA1c:  Lab Results  Component Value Date   HGBA1C 5.4 05/17/2019   Urine Drug Screen:     Component Value Date/Time   LABOPIA NONE DETECTED 05/17/2019 0218   COCAINSCRNUR NONE DETECTED 05/17/2019 0218   LABBENZ NONE DETECTED 05/17/2019 0218   AMPHETMU NONE DETECTED 05/17/2019 0218   THCU POSITIVE (A) 05/17/2019 0218   LABBARB NONE DETECTED 05/17/2019 0218    Alcohol Level     Component Value  Date/Time   ETH <10 05/17/2019 0612    IMAGING past 48 hours CT ANGIO HEAD W OR WO CONTRAST  Result Date: 05/17/2019 CLINICAL DATA:  Stroke follow-up. 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 EXAM: CT ANGIOGRAPHY HEAD AND NECK TECHNIQUE: Multidetector CT imaging of the head and neck was performed using the standard protocol during bolus administration of intravenous contrast. Multiplanar CT image reconstructions and MIPs were obtained to evaluate the vascular anatomy. Carotid stenosis measurements (when applicable) are obtained utilizing NASCET criteria, using the distal internal carotid diameter as the denominator. CONTRAST:  3mL OMNIPAQUE IOHEXOL 350 MG/ML SOLN COMPARISON:  Head MRI, head MRA, and neck MRA 05/17/2019 FINDINGS: CTA NECK FINDINGS Aortic arch: Standard 3 vessel  aortic arch with mild atherosclerotic plaque. Widely patent arch vessel origins. Right carotid system: Patent with small volume soft plaque in the carotid bulb. No evidence of significant stenosis or dissection. Left carotid system: Patent common and external carotid arteries. Thrombus at the ICA origin with complete occlusion of the ICA in the neck. Vertebral arteries: Patent with the right being moderately dominant. No evidence of significant stenosis or dissection allowing for artifact intermittently limiting assessment of the left V1 segment. Skeleton: Advanced disc degeneration throughout the cervical spine with exception of C2-3. Interbody ankylosis at C4-5 and C5-6. Other neck: 3.3 cm hypoattenuating right thyroid nodule. Upper chest: Mild centrilobular emphysema. Review of the MIP images confirms the above findings CTA HEAD FINDINGS Anterior circulation: The intracranial right ICA is patent with mild atherosclerotic plaque not resulting in significant stenosis. The intracranial left ICA is occluded proximally with distal reconstitution via the posterior communicating artery. ACAs and MCAs are patent without  evidence of proximal branch occlusion or significant proximal stenosis. There is mild asymmetric attenuation of left MCA branch vessels. The left A1 segment is mildly hypoplastic. There is a patent anterior communicating artery. No aneurysm is identified. Posterior circulation: The intracranial vertebral arteries are widely patent to the basilar. Patent PICA and SCA origins are seen bilaterally. AICAs are small and not well evaluated. The basilar artery is widely patent. There are moderately large posterior communicating arteries bilaterally with hypoplasia of the right P1 segment. Both PCAs are patent without evidence of significant proximal stenosis. No aneurysm is identified. Venous sinuses: As permitted by contrast timing, patent. Anatomic variants: Robust posterior communicating arteries with hypoplastic right P1 segment. Mildly hypoplastic left A1 segment. Review of the MIP images confirms the above findings IMPRESSION: 1. 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 2. Widely patent right carotid artery and vertebral arteries. 3. No significant intracranial stenosis. 4. 3.3 cm right thyroid nodule. Recommend thyroid US (ref: J Am Coll Radiol. 2015 Feb;12(2): 143-50). Electronically Signed   By: Logan Bores M.D.   On: 05/17/2019 11:13   CT HEAD WO CONTRAST  Result Date: 05/17/2019 CLINICAL DATA:  74 year old male with increased right side weakness. Left ICA occlusion. Left MCA territory infarcts on brain MRI earlier today. EXAM: CT HEAD WITHOUT CONTRAST TECHNIQUE: Contiguous axial images were obtained from the base of the skull through the vertex without intravenous contrast. COMPARISON:  Brain MRI 0256 hours today.  Head CT 0014 hours today. FINDINGS: Brain: Cytotoxic edema in the left insula and basal ganglia is slightly less apparent since 0014 hours today (series 2, image 16 now versus series 3, image 15 earlier). No associated hemorrhage by  CT. No associated mass effect. No new area of infarction is evident by CT. Right hemisphere and posterior fossa gray-white matter differentiation remains within normal limits. No ventriculomegaly. No midline shift, mass effect, or evidence of intracranial mass lesion. Vascular: Calcified atherosclerosis at the skull base. Skull: No acute osseous abnormality identified. Congenital incomplete ossification of the posterior C1 ring. Sinuses/Orbits: Visualized paranasal sinuses and mastoids are stable and well pneumatized. Several mucous retention cysts and small frontal sinus osteoma is again noted. Other: No acute orbit or scalp soft tissue findings. IMPRESSION: 1. No extension of the Left MCA infarct is evident since 0256 hours today. No hemorrhagic transformation by CT, and no mass effect. 2. No new intracranial abnormality. Electronically Signed   By: Genevie Ann M.D.   On: 05/17/2019 19:13   CT HEAD WO CONTRAST  Result Date: 05/17/2019 CLINICAL DATA:  Dizziness, confusion, blurred vision EXAM: CT HEAD WITHOUT CONTRAST TECHNIQUE: Contiguous axial images were obtained from the base of the skull through the vertex without intravenous contrast. COMPARISON:  None. FINDINGS: Brain: There is low-density noted in the left insular cortex, extending into the left basal ganglia and corona radiata compatible with acute to subacute infarct. No hemorrhage. No midline shift. No hydrocephalus. Vascular: No hyperdense vessel or unexpected calcification. Skull: No acute calvarial abnormality. Sinuses/Orbits: Visualized paranasal sinuses and mastoids clear. Orbital soft tissues unremarkable. Other: None IMPRESSION: Infarct within the left insular cortex, left basal ganglia and corona radiata, likely acute to subacute. Electronically Signed   By: Rolm Baptise M.D.   On: 05/17/2019 00:23   CT ANGIO NECK W OR WO CONTRAST  Result Date: 05/17/2019 CLINICAL DATA:  Stroke follow-up. Acute left MCA infarcts with left ICA occlusion on  MRI/MRA. EXAM: CT ANGIOGRAPHY HEAD AND NECK TECHNIQUE: Multidetector CT imaging of the head and neck was performed using the standard protocol during bolus administration of intravenous contrast. Multiplanar CT image reconstructions and MIPs were obtained to evaluate the vascular anatomy. Carotid stenosis measurements (when applicable) are obtained utilizing NASCET criteria, using the distal internal carotid diameter as the denominator. CONTRAST:  42mL OMNIPAQUE IOHEXOL 350 MG/ML SOLN COMPARISON:  Head MRI, head MRA, and neck MRA 05/17/2019 FINDINGS: CTA NECK FINDINGS Aortic arch: Standard 3 vessel aortic arch with mild atherosclerotic plaque. Widely patent arch vessel origins. Right carotid system: Patent with small volume soft plaque in the carotid bulb. No evidence of significant stenosis or dissection. Left carotid system: Patent common and external carotid arteries. Thrombus at the ICA origin with complete occlusion of the ICA in the neck. Vertebral arteries: Patent with the right being moderately dominant. No evidence of significant stenosis or dissection allowing for artifact intermittently limiting assessment of the left V1 segment. Skeleton: Advanced disc degeneration throughout the cervical spine with exception of C2-3. Interbody ankylosis at C4-5 and C5-6. Other neck: 3.3 cm hypoattenuating right thyroid nodule. Upper chest: Mild centrilobular emphysema. Review of the MIP images confirms the above findings CTA HEAD FINDINGS Anterior circulation: The intracranial right ICA is patent with mild atherosclerotic plaque not resulting in significant stenosis. The intracranial left ICA is occluded proximally with distal reconstitution via the posterior communicating artery. ACAs and MCAs are patent without evidence of proximal branch occlusion or significant proximal stenosis. There is mild asymmetric attenuation of left MCA branch vessels. The left A1 segment is mildly hypoplastic. There is a patent anterior  communicating artery. No aneurysm is identified. Posterior circulation: The intracranial vertebral arteries are widely patent to the basilar. Patent PICA and SCA origins are seen bilaterally. AICAs are small and not well evaluated. The basilar artery is widely patent. There are moderately large posterior communicating arteries bilaterally with hypoplasia of the right P1 segment. Both PCAs are patent without evidence of significant proximal stenosis. No aneurysm is identified. Venous sinuses: As permitted by contrast timing, patent. Anatomic variants: Robust posterior communicating arteries with hypoplastic right P1 segment. Mildly hypoplastic left A1 segment. Review of the MIP images confirms the above findings IMPRESSION: 1. 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 2. Widely patent right carotid artery and vertebral arteries. 3. No significant intracranial stenosis. 4. 3.3 cm right thyroid nodule. Recommend thyroid US (ref: J Am Coll Radiol. 2015 Feb;12(2): 143-50). Electronically Signed   By: Logan Bores M.D.   On: 05/17/2019 11:13   MR ANGIO  HEAD WO CONTRAST  Result Date: 05/17/2019 CLINICAL DATA:  Initial evaluation for acute stroke, right-sided weakness, dysarthria. EXAM: MRI HEAD WITHOUT CONTRAST MRA HEAD WITHOUT CONTRAST MRA NECK WITHOUT AND WITH CONTRAST TECHNIQUE: Multiplanar, multiecho pulse sequences of the brain and surrounding structures were obtained without intravenous contrast. Angiographic images of the Circle of Willis were obtained using MRA technique without intravenous contrast. Angiographic images of the neck were obtained using MRA technique without and with intravenous contrast. Carotid stenosis measurements (when applicable) are obtained utilizing NASCET criteria, using the distal internal carotid diameter as the denominator. CONTRAST:  7.8mL GADAVIST GADOBUTROL 1 MMOL/ML IV SOLN COMPARISON:  Prior head CT from earlier the  same day. FINDINGS: MRI HEAD FINDINGS Brain: Examination mildly degraded by motion artifact. Diffuse prominence of the CSF containing spaces compatible with generalized age-related cerebral atrophy. Patchy T2/FLAIR hyperintensity within the periventricular deep white matter both cerebral hemispheres most consistent with chronic small vessel ischemic disease, mild for age. Multifocal areas of restricted diffusion involving the left insular cortex, left basal ganglia, with a few additional patchy cortical infarcts within the overlying left frontal and temporal lobes, consistent with left MCA territory infarcts. These are acute to early subacute in appearance with associated T2/FLAIR signal abnormality. Associated petechial hemorrhage at the level of the left lentiform nucleus without frank hemorrhagic transformation. Diffusion abnormality/edema extending into the left cerebral peduncle and midbrain indicative of involvement of the underlying corticospinal tract. No significant mass effect. No other evidence for acute or subacute ischemia. Gray-white matter differentiation otherwise maintained. No encephalomalacia to suggest chronic cortical infarction elsewhere within the brain. No other foci of susceptibility artifact to suggest acute or chronic intracranial hemorrhage. No mass lesion, midline shift or mass effect. No hydrocephalus. No extra-axial fluid collection. Pituitary gland within normal limits. Midline structures intact. Vascular: Loss of normal flow void within the left ICA to the level of the terminus, which could be related to slow flow and/or occlusion. Major intracranial vascular flow voids otherwise maintained. Skull and upper cervical spine: Craniocervical junction within normal limits. Bone marrow signal intensity normal. No scalp soft tissue abnormality. Sinuses/Orbits: Globes and orbital soft tissues within normal limits. Mild scattered mucosal thickening noted within the ethmoidal sinuses. Small  left maxillary sinus retention cyst noted. No significant mastoid effusion. Inner ear structures grossly normal. Other: None. MRA HEAD FINDINGS ANTERIOR CIRCULATION: Examination moderately degraded by motion artifact. 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 Distal cervical segment of the right ICA widely patent with antegrade flow. Petrous, cavernous, and supraclinoid segments demonstrate atheromatous irregularity but are patent without definite flow-limiting stenosis. Both A1 segments widely patent. Normal anterior communicating artery. Anterior cerebral arteries patent to their distal aspects without stenosis. Right M1 widely patent. Perfusion of the left M1 via collateral flow across the circle-of-Willis and/or from the posterior circulation. Left M1 widely patent as well. Grossly negative MCA bifurcations. Distal MCA branches well perfused. Right MCA branches somewhat attenuated as compared to the contralateral left. POSTERIOR CIRCULATION: Vertebral arteries widely patent to the vertebrobasilar junction without stenosis. Right vertebral artery dominant. Posterior inferior cerebral arteries patent bilaterally. Basilar widely patent to its distal aspect without stenosis. Superior cerebral arteries patent bilaterally. PCA supplied via the basilar as well as robust bilateral posterior communicating arteries. Both PCAs well perfused to their distal aspects without stenosis. No intracranial aneurysm. MRA NECK FINDINGS AORTIC ARCH: Visualized aortic arch of normal caliber with normal 3 vessel morphology. No hemodynamically significant stenosis seen about the origin of the great vessels.  Visualized subclavian arteries widely patent. RIGHT CAROTID SYSTEM: Right common carotid artery widely patent from its origin to the bifurcation without stenosis. No significant atheromatous narrowing about the right bifurcation. Right ICA widely patent distally to the skull base without stenosis or vascular occlusion. LEFT  CAROTID SYSTEM: Left common carotid artery widely patent from its origin to the bifurcation. 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 Left ICA remains occluded to the circle-of-Willis. VERTEBRAL ARTERIES: Both vertebral arteries arise from the subclavian arteries. Right vertebral artery dominant and widely patent to the skull base. Multifocal moderate to severe stenoses seen involving the proximal left V1 segment, likely atherosclerotic (series 1046, image 13). Left vertebral artery otherwise widely patent distally without stenosis or vascular occlusion. Incidental note made of a 3 cm right thyroid nodule, indeterminate. IMPRESSION: MRI HEAD IMPRESSION: 1. Patchy multifocal acute to early subacute ischemic left MCA territory infarcts involving the left insular cortex, left basal ganglia, and overlying left frontal and temporal lobes as above. No significant mass effect. 2. Associated petechial hemorrhage at the left lentiform nucleus without frank hemorrhagic transformation. 3. Abnormal flow void within the left ICA to the level of the terminus, consistent with occlusion. See below. 4. Underlying mild chronic microvascular ischemic disease. MRA HEAD IMPRESSION: 1. Motion degraded exam. 2. 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 Left MCA and its branches are perfused via collateral flow across the circle-of-Willis and/or from the posterior circulation. 3. Otherwise grossly negative MRA of the head. No other large vessel occlusion. No hemodynamically significant or correctable stenosis identified. MRA NECK IMPRESSION: 1. 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 2. Wide patency of the right carotid artery system within the neck. 3. 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 Left vertebral artery otherwise widely patent as is the dominant right vertebral artery. 4. Approximate 3 cm right thyroid nodule,  indeterminate. Follow-up examination with dedicated thyroid ultrasound recommended for further evaluation. This could be performed on a nonemergent outpatient basis. Electronically Signed   By: Jeannine Boga M.D.   On: 05/17/2019 04:27   MR ANGIO NECK W WO CONTRAST  Result Date: 05/17/2019 CLINICAL DATA:  Initial evaluation for acute stroke, right-sided weakness, dysarthria. EXAM: MRI HEAD WITHOUT CONTRAST MRA HEAD WITHOUT CONTRAST MRA NECK WITHOUT AND WITH CONTRAST TECHNIQUE: Multiplanar, multiecho pulse sequences of the brain and surrounding structures were obtained without intravenous contrast. Angiographic images of the Circle of Willis were obtained using MRA technique without intravenous contrast. Angiographic images of the neck were obtained using MRA technique without and with intravenous contrast. Carotid stenosis measurements (when applicable) are obtained utilizing NASCET criteria, using the distal internal carotid diameter as the denominator. CONTRAST:  7.10mL GADAVIST GADOBUTROL 1 MMOL/ML IV SOLN COMPARISON:  Prior head CT from earlier the same day. FINDINGS: MRI HEAD FINDINGS Brain: Examination mildly degraded by motion artifact. Diffuse prominence of the CSF containing spaces compatible with generalized age-related cerebral atrophy. Patchy T2/FLAIR hyperintensity within the periventricular deep white matter both cerebral hemispheres most consistent with chronic small vessel ischemic disease, mild for age. Multifocal areas of restricted diffusion involving the left insular cortex, left basal ganglia, with a few additional patchy cortical infarcts within the overlying left frontal and temporal lobes, consistent with left MCA territory infarcts. These are acute to early subacute in appearance with associated T2/FLAIR signal abnormality. Associated petechial hemorrhage at the level of the left lentiform nucleus without  frank hemorrhagic transformation. Diffusion abnormality/edema extending into  the left cerebral peduncle and midbrain indicative of involvement of the underlying corticospinal tract. No significant mass effect. No other evidence for acute or subacute ischemia. Gray-white matter differentiation otherwise maintained. No encephalomalacia to suggest chronic cortical infarction elsewhere within the brain. No other foci of susceptibility artifact to suggest acute or chronic intracranial hemorrhage. No mass lesion, midline shift or mass effect. No hydrocephalus. No extra-axial fluid collection. Pituitary gland within normal limits. Midline structures intact. Vascular: Loss of normal flow void within the left ICA to the level of the terminus, which could be related to slow flow and/or occlusion. Major intracranial vascular flow voids otherwise maintained. Skull and upper cervical spine: Craniocervical junction within normal limits. Bone marrow signal intensity normal. No scalp soft tissue abnormality. Sinuses/Orbits: Globes and orbital soft tissues within normal limits. Mild scattered mucosal thickening noted within the ethmoidal sinuses. Small left maxillary sinus retention cyst noted. No significant mastoid effusion. Inner ear structures grossly normal. Other: None. MRA HEAD FINDINGS ANTERIOR CIRCULATION: Examination moderately degraded by motion artifact. 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 Distal cervical segment of the right ICA widely patent with antegrade flow. Petrous, cavernous, and supraclinoid segments demonstrate atheromatous irregularity but are patent without definite flow-limiting stenosis. Both A1 segments widely patent. Normal anterior communicating artery. Anterior cerebral arteries patent to their distal aspects without stenosis. Right M1 widely patent. Perfusion of the left M1 via collateral flow across the circle-of-Willis and/or from the posterior circulation. Left M1 widely patent as well. Grossly negative MCA bifurcations. Distal MCA branches well perfused.  Right MCA branches somewhat attenuated as compared to the contralateral left. POSTERIOR CIRCULATION: Vertebral arteries widely patent to the vertebrobasilar junction without stenosis. Right vertebral artery dominant. Posterior inferior cerebral arteries patent bilaterally. Basilar widely patent to its distal aspect without stenosis. Superior cerebral arteries patent bilaterally. PCA supplied via the basilar as well as robust bilateral posterior communicating arteries. Both PCAs well perfused to their distal aspects without stenosis. No intracranial aneurysm. MRA NECK FINDINGS AORTIC ARCH: Visualized aortic arch of normal caliber with normal 3 vessel morphology. No hemodynamically significant stenosis seen about the origin of the great vessels. Visualized subclavian arteries widely patent. RIGHT CAROTID SYSTEM: Right common carotid artery widely patent from its origin to the bifurcation without stenosis. No significant atheromatous narrowing about the right bifurcation. Right ICA widely patent distally to the skull base without stenosis or vascular occlusion. LEFT CAROTID SYSTEM: Left common carotid artery widely patent from its origin to the bifurcation. 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 Left ICA remains occluded to the circle-of-Willis. VERTEBRAL ARTERIES: Both vertebral arteries arise from the subclavian arteries. Right vertebral artery dominant and widely patent to the skull base. Multifocal moderate to severe stenoses seen involving the proximal left V1 segment, likely atherosclerotic (series 1046, image 13). Left vertebral artery otherwise widely patent distally without stenosis or vascular occlusion. Incidental note made of a 3 cm right thyroid nodule, indeterminate. IMPRESSION: MRI HEAD IMPRESSION: 1. Patchy multifocal acute to early subacute ischemic left MCA territory infarcts involving the left insular cortex, left basal ganglia, and overlying left frontal  and temporal lobes as above. No significant mass effect. 2. Associated petechial hemorrhage at the left lentiform nucleus without frank hemorrhagic transformation. 3. Abnormal flow void within the left ICA to the level of the terminus, consistent with occlusion. See below. 4. Underlying mild chronic microvascular ischemic disease. MRA HEAD IMPRESSION:  1. Motion degraded exam. 2. 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 Left MCA and its branches are perfused via collateral flow across the circle-of-Willis and/or from the posterior circulation. 3. Otherwise grossly negative MRA of the head. No other large vessel occlusion. No hemodynamically significant or correctable stenosis identified. MRA NECK IMPRESSION: 1. 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 2. Wide patency of the right carotid artery system within the neck. 3. 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 Left vertebral artery otherwise widely patent as is the dominant right vertebral artery. 4. Approximate 3 cm right thyroid nodule, indeterminate. Follow-up examination with dedicated thyroid ultrasound recommended for further evaluation. This could be performed on a nonemergent outpatient basis. Electronically Signed   By: Jeannine Boga M.D.   On: 05/17/2019 04:27   MR BRAIN WO CONTRAST  Result Date: 05/17/2019 CLINICAL DATA:  Initial evaluation for acute stroke, right-sided weakness, dysarthria. EXAM: MRI HEAD WITHOUT CONTRAST MRA HEAD WITHOUT CONTRAST MRA NECK WITHOUT AND WITH CONTRAST TECHNIQUE: Multiplanar, multiecho pulse sequences of the brain and surrounding structures were obtained without intravenous contrast. Angiographic images of the Circle of Willis were obtained using MRA technique without intravenous contrast. Angiographic images of the neck were obtained using MRA technique without and with intravenous contrast. Carotid stenosis measurements (when applicable) are  obtained utilizing NASCET criteria, using the distal internal carotid diameter as the denominator. CONTRAST:  7.19mL GADAVIST GADOBUTROL 1 MMOL/ML IV SOLN COMPARISON:  Prior head CT from earlier the same day. FINDINGS: MRI HEAD FINDINGS Brain: Examination mildly degraded by motion artifact. Diffuse prominence of the CSF containing spaces compatible with generalized age-related cerebral atrophy. Patchy T2/FLAIR hyperintensity within the periventricular deep white matter both cerebral hemispheres most consistent with chronic small vessel ischemic disease, mild for age. Multifocal areas of restricted diffusion involving the left insular cortex, left basal ganglia, with a few additional patchy cortical infarcts within the overlying left frontal and temporal lobes, consistent with left MCA territory infarcts. These are acute to early subacute in appearance with associated T2/FLAIR signal abnormality. Associated petechial hemorrhage at the level of the left lentiform nucleus without frank hemorrhagic transformation. Diffusion abnormality/edema extending into the left cerebral peduncle and midbrain indicative of involvement of the underlying corticospinal tract. No significant mass effect. No other evidence for acute or subacute ischemia. Gray-white matter differentiation otherwise maintained. No encephalomalacia to suggest chronic cortical infarction elsewhere within the brain. No other foci of susceptibility artifact to suggest acute or chronic intracranial hemorrhage. No mass lesion, midline shift or mass effect. No hydrocephalus. No extra-axial fluid collection. Pituitary gland within normal limits. Midline structures intact. Vascular: Loss of normal flow void within the left ICA to the level of the terminus, which could be related to slow flow and/or occlusion. Major intracranial vascular flow voids otherwise maintained. Skull and upper cervical spine: Craniocervical junction within normal limits. Bone marrow signal  intensity normal. No scalp soft tissue abnormality. Sinuses/Orbits: Globes and orbital soft tissues within normal limits. Mild scattered mucosal thickening noted within the ethmoidal sinuses. Small left maxillary sinus retention cyst noted. No significant mastoid effusion. Inner ear structures grossly normal. Other: None. MRA HEAD FINDINGS ANTERIOR CIRCULATION: Examination moderately degraded by motion artifact. 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 Distal cervical segment of the right ICA widely patent with antegrade flow. Petrous, cavernous, and supraclinoid segments demonstrate atheromatous irregularity but are patent without definite flow-limiting stenosis. Both A1 segments widely  patent. Normal anterior communicating artery. Anterior cerebral arteries patent to their distal aspects without stenosis. Right M1 widely patent. Perfusion of the left M1 via collateral flow across the circle-of-Willis and/or from the posterior circulation. Left M1 widely patent as well. Grossly negative MCA bifurcations. Distal MCA branches well perfused. Right MCA branches somewhat attenuated as compared to the contralateral left. POSTERIOR CIRCULATION: Vertebral arteries widely patent to the vertebrobasilar junction without stenosis. Right vertebral artery dominant. Posterior inferior cerebral arteries patent bilaterally. Basilar widely patent to its distal aspect without stenosis. Superior cerebral arteries patent bilaterally. PCA supplied via the basilar as well as robust bilateral posterior communicating arteries. Both PCAs well perfused to their distal aspects without stenosis. No intracranial aneurysm. MRA NECK FINDINGS AORTIC ARCH: Visualized aortic arch of normal caliber with normal 3 vessel morphology. No hemodynamically significant stenosis seen about the origin of the great vessels. Visualized subclavian arteries widely patent. RIGHT CAROTID SYSTEM: Right common carotid artery widely patent from its origin to  the bifurcation without stenosis. No significant atheromatous narrowing about the right bifurcation. Right ICA widely patent distally to the skull base without stenosis or vascular occlusion. LEFT CAROTID SYSTEM: Left common carotid artery widely patent from its origin to the bifurcation. 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 Left ICA remains occluded to the circle-of-Willis. VERTEBRAL ARTERIES: Both vertebral arteries arise from the subclavian arteries. Right vertebral artery dominant and widely patent to the skull base. Multifocal moderate to severe stenoses seen involving the proximal left V1 segment, likely atherosclerotic (series 1046, image 13). Left vertebral artery otherwise widely patent distally without stenosis or vascular occlusion. Incidental note made of a 3 cm right thyroid nodule, indeterminate. IMPRESSION: MRI HEAD IMPRESSION: 1. Patchy multifocal acute to early subacute ischemic left MCA territory infarcts involving the left insular cortex, left basal ganglia, and overlying left frontal and temporal lobes as above. No significant mass effect. 2. Associated petechial hemorrhage at the left lentiform nucleus without frank hemorrhagic transformation. 3. Abnormal flow void within the left ICA to the level of the terminus, consistent with occlusion. See below. 4. Underlying mild chronic microvascular ischemic disease. MRA HEAD IMPRESSION: 1. Motion degraded exam. 2. 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 Left MCA and its branches are perfused via collateral flow across the circle-of-Willis and/or from the posterior circulation. 3. Otherwise grossly negative MRA of the head. No other large vessel occlusion. No hemodynamically significant or correctable stenosis identified. MRA NECK IMPRESSION: 1. 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 2. Wide patency of the right carotid artery system within the neck. 3.  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 Left vertebral artery otherwise widely patent as is the dominant right vertebral artery. 4. Approximate 3 cm right thyroid nodule, indeterminate. Follow-up examination with dedicated thyroid ultrasound recommended for further evaluation. This could be performed on a nonemergent outpatient basis. Electronically Signed   By: Jeannine Boga M.D.   On: 05/17/2019 04:27   DG Chest Portable 1 View  Result Date: 05/17/2019 CLINICAL DATA:  Slurred speech, right-sided weakness EXAM: PORTABLE CHEST 1 VIEW COMPARISON:  None. FINDINGS: Mild hyperinflation of the lungs. Heart and mediastinal contours are within normal limits. No focal opacities or effusions. No acute bony abnormality. IMPRESSION: No active disease. Electronically Signed   By: Rolm Baptise M.D.   On: 05/17/2019 00:08   ECHOCARDIOGRAM COMPLETE  Result Date: 05/17/2019  ECHOCARDIOGRAM REPORT   Patient Name:   Ruben Moore Date of Exam: 05/17/2019 Medical Rec #:  [id number]        Height:       75.0 in Accession #:    [id number]       Weight:       162.0 lb Date of Birth:  1945-07-26         BSA:          2.01 m Patient Age:    64 years         BP:           113/50 mmHg Patient Gender: M                HR:           62 bpm. Exam Location:  Inpatient Procedure: 2D Echo, Color Doppler and Cardiac Doppler Indications:    Stroke  History:        Patient has no prior history of Echocardiogram examinations.                 Risk Factors:Diabetes.  Sonographer:    Raquel Sarna Senior RDCS Referring Phys: [id number] [place]  1. Left ventricular ejection fraction, by visual estimation, is 60 to 65%. The left ventricle has normal function. There is no left ventricular hypertrophy.  2. The left ventricle has no regional wall motion abnormalities.  3. Global right ventricle has normal systolic function.The right ventricular size is normal. No increase in right ventricular wall thickness.   4. Left atrial size was normal.  5. Right atrial size was normal.  6. The mitral valve is normal in structure. No evidence of mitral valve regurgitation. No evidence of mitral stenosis.  7. The tricuspid valve is normal in structure.  8. The tricuspid valve is normal in structure. Tricuspid valve regurgitation is trivial.  9. The aortic valve is normal in structure. Aortic valve regurgitation is not visualized. No evidence of aortic valve sclerosis or stenosis. 10. The pulmonic valve was normal in structure. Pulmonic valve regurgitation is not visualized. 11. Mildly elevated pulmonary artery systolic pressure. 12. The inferior vena cava is normal in size with greater than 50% respiratory variability, suggesting right atrial pressure of 3 mmHg. FINDINGS  Left Ventricle: Left ventricular ejection fraction, by visual estimation, is 60 to 65%. The left ventricle has normal function. The left ventricle has no regional wall motion abnormalities. The left ventricular internal cavity size was the left ventricle is normal in size. There is no left ventricular hypertrophy. Left ventricular diastolic parameters were normal. Normal left atrial pressure. Right Ventricle: The right ventricular size is normal. No increase in right ventricular wall thickness. Global RV systolic function is has normal systolic function. The tricuspid regurgitant velocity is 2.76 m/s, and with an assumed right atrial pressure  of 3 mmHg, the estimated right ventricular systolic pressure is mildly elevated at 33.5 mmHg. Left Atrium: Left atrial size was normal in size. Right Atrium: Right atrial size was normal in size Pericardium: There is no evidence of pericardial effusion. Mitral Valve: The mitral valve is normal in structure. No evidence of mitral valve regurgitation. No evidence of mitral valve stenosis by observation. Tricuspid Valve: The tricuspid valve is normal in structure. Tricuspid valve regurgitation is trivial. Aortic Valve: The aortic  valve is normal in structure. Aortic valve regurgitation is not visualized. The aortic valve is structurally normal, with no evidence of sclerosis or stenosis. Pulmonic Valve: The pulmonic valve was  normal in structure. Pulmonic valve regurgitation is not visualized. Pulmonic regurgitation is not visualized. Aorta: The aortic root, ascending aorta and aortic arch are all structurally normal, with no evidence of dilitation or obstruction. Venous: The inferior vena cava is normal in size with greater than 50% respiratory variability, suggesting right atrial pressure of 3 mmHg. IAS/Shunts: No atrial level shunt detected by color flow Doppler. There is no evidence of a patent foramen ovale. No ventricular septal defect is seen or detected. There is no evidence of an atrial septal defect.  LEFT VENTRICLE PLAX 2D LVIDd:         3.80 cm  Diastology LVIDs:         2.50 cm  LV e' lateral:   8.49 cm/s LV PW:         0.90 cm  LV E/e' lateral: 9.1 LV IVS:        1.00 cm  LV e' medial:    6.31 cm/s LVOT diam:     2.00 cm  LV E/e' medial:  12.2 LV SV:         40 ml LV SV Index:   20.21 LVOT Area:     3.14 cm  RIGHT VENTRICLE RV S prime:     13.40 cm/s TAPSE (M-mode): 2.1 cm LEFT ATRIUM             Index LA diam:        2.40 cm 1.20 cm/m LA Vol (A2C):   57.8 ml 28.81 ml/m LA Vol (A4C):   34.5 ml 17.19 ml/m LA Biplane Vol: 49.2 ml 24.52 ml/m  AORTIC VALVE LVOT Vmax:   97.50 cm/s LVOT Vmean:  62.900 cm/s LVOT VTI:    0.206 m  AORTA Ao Root diam: 2.30 cm MITRAL VALVE                        TRICUSPID VALVE MV Area (PHT): 2.95 cm             TR Peak grad:   30.5 mmHg MV PHT:        74.53 msec           TR Vmax:        276.00 cm/s MV Decel Time: 257 msec MV E velocity: 77.10 cm/s 103 cm/s  SHUNTS MV A velocity: 79.50 cm/s 70.3 cm/s Systemic VTI:  0.21 m MV E/A ratio:  0.97       1.5       Systemic Diam: 2.00 cm  Mihai Croitoru MD Electronically signed by Sanda Klein MD Signature Date/Time: 05/17/2019/10:54:47 AM    Final      PHYSICAL EXAM    Temp:  [98.2 F (36.8 C)-99 F (37.2 C)] 99 F (37.2 C) (01/27 0838) Pulse Rate:  [55-79] 73 (01/27 0838) Resp:  [16-23] 23 (01/27 0838) BP: (107-130)/(42-64) 113/64 (01/27 0838) SpO2:  [99 %-100 %] 99 % (01/27 0838)  General - Well nourished, well developed, in no apparent distress.  Ophthalmologic - fundi not visualized due to noncooperation.  Cardiovascular - Regular rhythm and rate.  Mental Status -  Level of arousal and orientation to time, place, and person were intact. Language including expression, naming, repetition, comprehension was assessed and found intact. Mild dysarthria Fund of Knowledge was assessed and was intact.  Cranial Nerves II - XII - II - Visual field intact OU. III, IV, VI - Extraocular movements intact. V - Facial sensation intact bilaterally. VII - Right facial droop,  and right eye close weaker than left. VIII - Hearing & vestibular intact bilaterally. X - Palate elevates symmetrically, mild dysarthria. XI - Chin turning & shoulder shrug intact bilaterally. XII - Tongue protrusion intact.  Motor Strength - The patient's strength was normal in LUE and LLE, RUE proximal 4/5 and distal 3/5. RLE 4/5 proximal and 4+/5 distally and pronator drift was present.  Bulk was normal and fasciculations were absent.   Motor Tone - Muscle tone was assessed at the neck and appendages and was normal.  Reflexes - The patient's reflexes were symmetrical in all extremities and he had no pathological reflexes.  Sensory - Light touch, temperature/pinprick were assessed and were symmetrical.    Coordination - The patient had normal movements in the hands with no ataxia or dysmetria, but R FTN significantly slow.  Tremor was absent.  Gait and Station - deferred.   ASSESSMENT/PLAN Mr. Briyon Mensah is a 74 y.o. male with history of DM presenting with R sided weakness, aphasia and dysarthria.   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 Petechial hemorrhage at L lentiform nucleus. L ICA w/ abnormal flow void c/w occlusion.    MRA head L ICA occlusion at terminus  MRA neck L ICA occlusion at origin/bifurcation. Multifocal moderate to severe proximal L V1 stenoses. 3 cm R thyroid nodule.  CTA head & neck left ICA occlusion at origin with intracranial recon via PCOM  Repeat CT head w/ clinical neuro worsening but no extension of L MCA infarct, no hemorrhagic transformation, no new abnormality  2D Echo EF 60-65%  LDL 85  HgbA1c 5.4  Lovenox 40 mg sq daily for VTE prophylaxis  No antithrombotic prior to admission, now on ASA 325mg  and plavix 75 DAPT for 3 months and then ASA aloen.  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 Permissive hypertension (OK if < 220/120) but gradually normalize in 5-7 days . Long-term BP goal 130-150 given left ICA occlusion . Avoid low BP . BP soft today 110-130, will add gentle hydration with IVF - encourage po intake . Orthostatic vital neg for orthostatic vital 1/27 . May need to consider midodrine if pt still BP dependent with above measures  Hyperlipidemia  Home meds:  No statin  Now on lipitor 40  LDL 85, goal < 70  Continue statin at discharge  Diabetes  type II Controlled  HgbA1c 5.4, goal < 7.0  CBGs  SSI  PCP follow up  Tobacco abuse  Current smoker  Smoking cessation counseling provided  Pt is willing to quit  Other Stroke Risk Factors  Advanced age  Hx ETOH use  Substance abuse - UDS:  THC POSITIVE. Patient advised to stop  using due to stroke risk.  Other Active Problems  Hx bladder cancer s/p resection 2019    Hospital day # 1  Neurology will sign off. Please call with questions. Pt will follow up with [facility] in about 4 weeks. Thanks for the consult.  Rosalin Hawking, MD PhD Stroke Neurology 05/18/2019 1:21 PM   To contact Stroke Continuity provider, please refer to [email or web address] After hours, contact General Neurology

## 2019-05-19 DIAGNOSIS — C679 Malignant neoplasm of bladder, unspecified: Secondary | ICD-10-CM

## 2019-05-19 DIAGNOSIS — E875 Hyperkalemia: Secondary | ICD-10-CM

## 2019-05-19 LAB — BASIC METABOLIC PANEL
Anion gap: 8 (ref 5–15)
BUN: 17 mg/dL (ref 8–23)
CO2: 25 mmol/L (ref 22–32)
Calcium: 8.6 mg/dL — ABNORMAL LOW (ref 8.9–10.3)
Chloride: 103 mmol/L (ref 98–111)
Creatinine, Ser: 1.2 mg/dL (ref 0.61–1.24)
GFR calc Af Amer: >60 mL/min (ref >60)
GFR calc non Af Amer: 60 mL/min — ABNORMAL LOW (ref >60)
Glucose, Bld: 103 mg/dL — ABNORMAL HIGH (ref 70–99)
Potassium: 3.5 mmol/L (ref 3.5–5.1)
Sodium: 136 mmol/L (ref 135–145)

## 2019-05-19 LAB — CBC
HCT: 40.6 % (ref 39.0–52.0)
Hemoglobin: 13 g/dL (ref 13.0–17.0)
MCH: 27.4 pg (ref 26.0–34.0)
MCHC: 32 g/dL (ref 30.0–36.0)
MCV: 85.5 fL (ref 80.0–100.0)
Platelets: 224 10*3/uL (ref 150–400)
RBC: 4.75 MIL/uL (ref 4.22–5.81)
RDW: 14.1 % (ref 11.5–15.5)
WBC: 11 10*3/uL — ABNORMAL HIGH (ref 4.0–10.5)
nRBC: 0 % (ref 0.0–0.2)

## 2019-05-19 LAB — GLUCOSE, CAPILLARY
Glucose-Capillary: 89 mg/dL (ref 70–99)
Glucose-Capillary: 105 mg/dL — ABNORMAL HIGH (ref 70–99)
Glucose-Capillary: 108 mg/dL — ABNORMAL HIGH (ref 70–99)
Glucose-Capillary: 100 mg/dL — ABNORMAL HIGH (ref 70–99)

## 2019-05-19 MED ORDER — ATORVASTATIN CALCIUM 40 MG PO TABS: 40.0000 mg | ORAL_TABLET | Freq: Every day | ORAL | Status: DC

## 2019-05-19 MED ORDER — CLOPIDOGREL BISULFATE 75 MG PO TABS: 75.0000 mg | ORAL_TABLET | Freq: Every day | ORAL | Status: DC

## 2019-05-19 MED ORDER — ASPIRIN 325 MG PO TBEC: 325.0000 mg | DELAYED_RELEASE_TABLET | Freq: Every day | ORAL | 0 refills | Status: AC

## 2019-05-19 NOTE — Discharge Summary (Addendum)
Physician Discharge Summary  Ruben Moore [id number] DOB: Mar 17, 1946 DOA: 05/16/2019  PCP: Patient, No Pcp Per  Admit date: 05/16/2019 Discharge date: 05/20/2019  Admitted From: home Disposition:  CIR  Recommendations for Outpatient Follow-up:  1. Follow up with PCP in 1-2 weeks 2. Follow-up with neurology in 2 months 3. Continue aspirin 325 mg and Plavix 75 mg dual antiplatelet therapy for 3 months then aspirin alone 4. To be discharged to CIR   Seen and examined this morning 1/29, stable for discharge   Home Health: None Equipment/Devices: None  Discharge Condition: Stable CODE STATUS: Full code Diet recommendation: Heart healthy  HPI: Per admitting MD, Ruben Moore is a 74 y.o. male with medical history significant for diabetes mellitus, now presenting to the emergency department with right-sided weakness and dysarthria.  Patient reports that he had been in his usual state of health a week ago, but does not remember anything from the past week and believes that he had been unconscious before waking this evening.  He reports waking with weakness involving the right side.  He denies any history of focal weakness.  He denies any headache, change in vision or hearing, or numbness.  He denies any chest pain or palpitations.  He denies any recent fevers or chills.  Reports that he takes an oral medication for diabetes and a couple other medications that he does not know the name of or indication.  Patient lives alone, has 1 surviving sibling, and a daughter, but no one who checks on him regularly. ED Course: Upon arrival to the ED, patient is found to be afebrile, saturating well on room air, and hypertensive with diastolic pressure [id number].  EKG features a sinus rhythm and chest x-rays negative for acute cardiopulmonary disease.  CBC notable for leukocytosis to 13,700.  CT head concerning for acute to subacute infarction within the left insular cortex, left basal ganglia, and left  corona radiata.  Chemistry panel and COVID-19 screening tests are pending, neurology was consulted by the ED physician, and hospitalists are asked to admit.  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  He was outside [facility] window.  He underwent a CT head which noted left insular cortex, basal ganglia and corona radiata infarct, and underwent an MRI afterwards which showed patchy left MCA territory infarct to be discharged and potential hemorrhage of the left lentiform nucleus, left ICA with abnormal flow void consistent with occlusion.  MRI of the head showed left ICA occlusion at 30 minutes, left ICA occlusion at origin/bifurcation.  Neurology was consulted and followed patient while hospitalized.  LDL was 85, hemoglobin A1c 5.4.  He was started on a statin.  2D echocardiogram showed an EF of 60-65%, no cardiac source of embolus.  He was started on aspirin and Plavix and he is to continue these for 3 months followed by aspirin alone.  Physical therapy evaluated patient and recommended CIR, will be discharged to continue rehabilitation. Type 2 diabetes mellitus-diet controlled, A1c 5.4.  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   atorvastatin 40 MG tablet Commonly known as: LIPITOR Take 1 tablet (40 mg total) by mouth daily at 6 PM.   clopidogrel 75 MG tablet Commonly known as: PLAVIX Take 1 tablet (75 mg total) by mouth daily.      Follow-up [facility]. Schedule an appointment as soon as possible for a visit in 4 week(s).           Consultations:  Neurology   Procedures/Studies:  2D echo  IMPRESSIONS    1. Left ventricular ejection fraction, by visual estimation, is 60 to 65%. The left ventricle has normal function. There is no left ventricular hypertrophy.  2. The left ventricle has no regional wall motion abnormalities.  3. Global right ventricle has normal systolic function.The right ventricular size is normal. No increase in right ventricular wall thickness.  4. Left atrial size was normal.  5. Right atrial size was normal.  6. The mitral valve is normal in structure. No evidence of mitral valve regurgitation. No evidence of mitral stenosis.  7. The tricuspid valve is normal in structure.  8. The tricuspid valve is normal in structure. Tricuspid valve regurgitation is trivial.  9. The aortic valve is normal in structure. Aortic valve regurgitation is not visualized. No evidence of aortic valve sclerosis or stenosis. 10. The pulmonic valve was normal in structure. Pulmonic valve regurgitation is not visualized. 11. Mildly elevated pulmonary artery systolic pressure. 12. The inferior vena cava is normal in size with greater than 50% respiratory variability, suggesting right atrial pressure of 3 mmHg.  CT ANGIO HEAD W OR WO CONTRAST  Result Date: 05/17/2019 CLINICAL DATA:  Stroke follow-up. 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 EXAM: CT ANGIOGRAPHY HEAD AND NECK TECHNIQUE: Multidetector CT imaging of the head and neck was performed using the standard protocol during bolus administration of intravenous contrast. Multiplanar CT image reconstructions and MIPs were obtained to evaluate the vascular anatomy. Carotid stenosis measurements (when applicable) are obtained utilizing NASCET criteria, using the distal internal carotid diameter as the denominator. CONTRAST:  13mL OMNIPAQUE IOHEXOL 350 MG/ML  SOLN COMPARISON:  Head MRI, head MRA, and neck MRA 05/17/2019 FINDINGS: CTA NECK FINDINGS Aortic arch: Standard 3 vessel aortic arch with mild atherosclerotic plaque. Widely patent arch vessel origins. Right carotid system: Patent with small volume soft plaque in the carotid bulb. No evidence of significant stenosis or dissection. Left carotid system: Patent common and external carotid arteries. Thrombus at the ICA origin with complete occlusion of the ICA in the neck. Vertebral arteries: Patent with the right being moderately dominant. No evidence of significant stenosis or dissection allowing for artifact intermittently limiting assessment of the left V1 segment. Skeleton: Advanced disc degeneration throughout the cervical spine with exception of C2-3. Interbody ankylosis at C4-5 and C5-6. Other neck: 3.3 cm hypoattenuating right thyroid nodule. Upper chest: Mild centrilobular emphysema. Review of the MIP images confirms the above findings CTA HEAD FINDINGS Anterior circulation: The intracranial right ICA is patent with mild atherosclerotic plaque not resulting in significant stenosis. The intracranial left ICA is occluded proximally with distal reconstitution via the posterior communicating artery. ACAs and MCAs are patent without evidence of proximal branch occlusion or significant proximal stenosis. There is mild asymmetric attenuation of left MCA branch vessels. The left A1 segment is mildly hypoplastic. There is a patent anterior communicating artery. No aneurysm is identified. Posterior circulation: The intracranial vertebral arteries are widely patent to the basilar. Patent PICA and SCA  origins are seen bilaterally. AICAs are small and not well evaluated. The basilar artery is widely patent. There are moderately large posterior communicating arteries bilaterally with hypoplasia of the right P1 segment. Both PCAs are patent without evidence of significant proximal stenosis. No aneurysm is identified. Venous  sinuses: As permitted by contrast timing, patent. Anatomic variants: Robust posterior communicating arteries with hypoplastic right P1 segment. Mildly hypoplastic left A1 segment. Review of the MIP images confirms the above findings IMPRESSION: 1. 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 2. Widely patent right carotid artery and vertebral arteries. 3. No significant intracranial stenosis. 4. 3.3 cm right thyroid nodule. Recommend thyroid US (ref: J Am Coll Radiol. 2015 Feb;12(2): 143-50). Electronically Signed   By: Logan Bores M.D.   On: 05/17/2019 11:13   CT HEAD WO CONTRAST  Result Date: 05/17/2019 CLINICAL DATA:  74 year old male with increased right side weakness. Left ICA occlusion. Left MCA territory infarcts on brain MRI earlier today. EXAM: CT HEAD WITHOUT CONTRAST TECHNIQUE: Contiguous axial images were obtained from the base of the skull through the vertex without intravenous contrast. COMPARISON:  Brain MRI 0256 hours today.  Head CT 0014 hours today. FINDINGS: Brain: Cytotoxic edema in the left insula and basal ganglia is slightly less apparent since 0014 hours today (series 2, image 16 now versus series 3, image 15 earlier). No associated hemorrhage by CT. No associated mass effect. No new area of infarction is evident by CT. Right hemisphere and posterior fossa gray-white matter differentiation remains within normal limits. No ventriculomegaly. No midline shift, mass effect, or evidence of intracranial mass lesion. Vascular: Calcified atherosclerosis at the skull base. Skull: No acute osseous abnormality identified. Congenital incomplete ossification of the posterior C1 ring. Sinuses/Orbits: Visualized paranasal sinuses and mastoids are stable and well pneumatized. Several mucous retention cysts and small frontal sinus osteoma is again noted. Other: No acute orbit or scalp soft tissue findings. IMPRESSION: 1. No extension of the  Left MCA infarct is evident since 0256 hours today. No hemorrhagic transformation by CT, and no mass effect. 2. No new intracranial abnormality. Electronically Signed   By: Genevie Ann M.D.   On: 05/17/2019 19:13   CT HEAD WO CONTRAST  Result Date: 05/17/2019 CLINICAL DATA:  Dizziness, confusion, blurred vision EXAM: CT HEAD WITHOUT CONTRAST TECHNIQUE: Contiguous axial images were obtained from the base of the skull through the vertex without intravenous contrast. COMPARISON:  None. FINDINGS: Brain: There is low-density noted in the left insular cortex, extending into the left basal ganglia and corona radiata compatible with acute to subacute infarct. No hemorrhage. No midline shift. No hydrocephalus. Vascular: No hyperdense vessel or unexpected calcification. Skull: No acute calvarial abnormality. Sinuses/Orbits: Visualized paranasal sinuses and mastoids clear. Orbital soft tissues unremarkable. Other: None IMPRESSION: Infarct within the left insular cortex, left basal ganglia and corona radiata, likely acute to subacute. Electronically Signed   By: Rolm Baptise M.D.   On: 05/17/2019 00:23   CT ANGIO NECK W OR WO CONTRAST  Result Date: 05/17/2019 CLINICAL DATA:  Stroke follow-up. 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 EXAM: CT ANGIOGRAPHY HEAD AND NECK TECHNIQUE: Multidetector CT imaging of the head and neck was performed using the standard protocol during bolus administration of intravenous contrast. Multiplanar CT image reconstructions and MIPs were obtained to evaluate the vascular anatomy. Carotid stenosis measurements (when applicable) are obtained utilizing NASCET criteria, using the distal internal carotid diameter as the denominator. CONTRAST:  82mL OMNIPAQUE IOHEXOL 350 MG/ML SOLN COMPARISON:  Head MRI, head MRA, and neck MRA 05/17/2019 FINDINGS: CTA NECK FINDINGS Aortic arch: Standard 3 vessel aortic arch with mild atherosclerotic plaque. Widely patent arch vessel origins. Right  carotid system: Patent with small volume soft plaque in the carotid bulb. No evidence of significant stenosis or dissection. Left carotid system: Patent common and external carotid arteries. Thrombus at the ICA origin with complete occlusion of the ICA in the neck. Vertebral arteries: Patent with the right being moderately dominant. No evidence of significant stenosis or dissection allowing for artifact intermittently limiting assessment of the left V1 segment. Skeleton: Advanced disc degeneration throughout the cervical spine with exception of C2-3. Interbody ankylosis at C4-5 and C5-6. Other neck: 3.3 cm hypoattenuating right thyroid nodule. Upper chest: Mild centrilobular emphysema. Review of the MIP images confirms the above findings CTA HEAD FINDINGS Anterior circulation: The intracranial right ICA is patent with mild atherosclerotic plaque not resulting in significant stenosis. The intracranial left ICA is occluded proximally with distal reconstitution via the posterior communicating artery. ACAs and MCAs are patent without evidence of proximal branch occlusion or significant proximal stenosis. There is mild asymmetric attenuation of left MCA branch vessels. The left A1 segment is mildly hypoplastic. There is a patent anterior communicating artery. No aneurysm is identified. Posterior circulation: The intracranial vertebral arteries are widely patent to the basilar. Patent PICA and SCA origins are seen bilaterally. AICAs are small and not well evaluated. The basilar artery is widely patent. There are moderately large posterior communicating arteries bilaterally with hypoplasia of the right P1 segment. Both PCAs are patent without evidence of significant proximal stenosis. No aneurysm is identified. Venous sinuses: As permitted by contrast timing, patent. Anatomic variants: Robust posterior communicating arteries with hypoplastic right P1 segment. Mildly hypoplastic left A1 segment. Review of the MIP images  confirms the above findings IMPRESSION: 1. 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 2. Widely patent right carotid artery and vertebral arteries. 3. No significant intracranial stenosis. 4. 3.3 cm right thyroid nodule. Recommend thyroid US (ref: J Am Coll Radiol. 2015 Feb;12(2): 143-50). Electronically Signed   By: Logan Bores M.D.   On: 05/17/2019 11:13   MR ANGIO HEAD WO CONTRAST  Result Date: 05/17/2019 CLINICAL DATA:  Initial evaluation for acute stroke, right-sided weakness, dysarthria. EXAM: MRI HEAD WITHOUT CONTRAST MRA HEAD WITHOUT CONTRAST MRA NECK WITHOUT AND WITH CONTRAST TECHNIQUE: Multiplanar, multiecho pulse sequences of the brain and surrounding structures were obtained without intravenous contrast. Angiographic images of the Circle of Willis were obtained using MRA technique without intravenous contrast. Angiographic images of the neck were obtained using MRA technique without and with intravenous contrast. Carotid stenosis measurements (when applicable) are obtained utilizing NASCET criteria, using the distal internal carotid diameter as the denominator. CONTRAST:  7.33mL GADAVIST GADOBUTROL 1 MMOL/ML IV SOLN COMPARISON:  Prior head CT from earlier the same day. FINDINGS: MRI HEAD FINDINGS Brain: Examination mildly degraded by motion artifact. Diffuse prominence of the CSF containing spaces compatible with generalized age-related cerebral atrophy. Patchy T2/FLAIR hyperintensity within the periventricular deep white matter both cerebral hemispheres most consistent with chronic small vessel ischemic disease, mild for age. Multifocal areas of restricted diffusion involving the left insular cortex, left basal ganglia, with a few additional patchy cortical infarcts within the overlying left frontal and temporal lobes, consistent with left MCA territory infarcts. These are acute to early subacute in appearance with associated T2/FLAIR  signal abnormality. Associated petechial  hemorrhage at the level of the left lentiform nucleus without frank hemorrhagic transformation. Diffusion abnormality/edema extending into the left cerebral peduncle and midbrain indicative of involvement of the underlying corticospinal tract. No significant mass effect. No other evidence for acute or subacute ischemia. Gray-white matter differentiation otherwise maintained. No encephalomalacia to suggest chronic cortical infarction elsewhere within the brain. No other foci of susceptibility artifact to suggest acute or chronic intracranial hemorrhage. No mass lesion, midline shift or mass effect. No hydrocephalus. No extra-axial fluid collection. Pituitary gland within normal limits. Midline structures intact. Vascular: Loss of normal flow void within the left ICA to the level of the terminus, which could be related to slow flow and/or occlusion. Major intracranial vascular flow voids otherwise maintained. Skull and upper cervical spine: Craniocervical junction within normal limits. Bone marrow signal intensity normal. No scalp soft tissue abnormality. Sinuses/Orbits: Globes and orbital soft tissues within normal limits. Mild scattered mucosal thickening noted within the ethmoidal sinuses. Small left maxillary sinus retention cyst noted. No significant mastoid effusion. Inner ear structures grossly normal. Other: None. MRA HEAD FINDINGS ANTERIOR CIRCULATION: Examination moderately degraded by motion artifact. 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 Distal cervical segment of the right ICA widely patent with antegrade flow. Petrous, cavernous, and supraclinoid segments demonstrate atheromatous irregularity but are patent without definite flow-limiting stenosis. Both A1 segments widely patent. Normal anterior communicating artery. Anterior cerebral arteries patent to their distal aspects without stenosis. Right M1 widely patent. Perfusion of the left M1 via  collateral flow across the circle-of-Willis and/or from the posterior circulation. Left M1 widely patent as well. Grossly negative MCA bifurcations. Distal MCA branches well perfused. Right MCA branches somewhat attenuated as compared to the contralateral left. POSTERIOR CIRCULATION: Vertebral arteries widely patent to the vertebrobasilar junction without stenosis. Right vertebral artery dominant. Posterior inferior cerebral arteries patent bilaterally. Basilar widely patent to its distal aspect without stenosis. Superior cerebral arteries patent bilaterally. PCA supplied via the basilar as well as robust bilateral posterior communicating arteries. Both PCAs well perfused to their distal aspects without stenosis. No intracranial aneurysm. MRA NECK FINDINGS AORTIC ARCH: Visualized aortic arch of normal caliber with normal 3 vessel morphology. No hemodynamically significant stenosis seen about the origin of the great vessels. Visualized subclavian arteries widely patent. RIGHT CAROTID SYSTEM: Right common carotid artery widely patent from its origin to the bifurcation without stenosis. No significant atheromatous narrowing about the right bifurcation. Right ICA widely patent distally to the skull base without stenosis or vascular occlusion. LEFT CAROTID SYSTEM: Left common carotid artery widely patent from its origin to the bifurcation. 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 Left ICA remains occluded to the circle-of-Willis. VERTEBRAL ARTERIES: Both vertebral arteries arise from the subclavian arteries. Right vertebral artery dominant and widely patent to the skull base. Multifocal moderate to severe stenoses seen involving the proximal left V1 segment, likely atherosclerotic (series 1046, image 13). Left vertebral artery otherwise widely patent distally without stenosis or vascular occlusion. Incidental note made of a 3 cm right thyroid nodule, indeterminate. IMPRESSION: MRI  HEAD IMPRESSION: 1. Patchy multifocal acute to early subacute ischemic left MCA territory infarcts involving the left insular cortex, left basal ganglia, and overlying left frontal and temporal lobes as above. No significant mass effect. 2. Associated petechial hemorrhage at the left lentiform nucleus without frank hemorrhagic transformation. 3. Abnormal flow void within the left ICA to the level of the terminus, consistent with occlusion. See below. 4.  Underlying mild chronic microvascular ischemic disease. MRA HEAD IMPRESSION: 1. Motion degraded exam. 2. 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 Left MCA and its branches are perfused via collateral flow across the circle-of-Willis and/or from the posterior circulation. 3. Otherwise grossly negative MRA of the head. No other large vessel occlusion. No hemodynamically significant or correctable stenosis identified. MRA NECK IMPRESSION: 1. 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 2. Wide patency of the right carotid artery system within the neck. 3. 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 Left vertebral artery otherwise widely patent as is the dominant right vertebral artery. 4. Approximate 3 cm right thyroid nodule, indeterminate. Follow-up examination with dedicated thyroid ultrasound recommended for further evaluation. This could be performed on a nonemergent outpatient basis. Electronically Signed   By: Jeannine Boga M.D.   On: 05/17/2019 04:27   MR ANGIO NECK W WO CONTRAST  Result Date: 05/17/2019 CLINICAL DATA:  Initial evaluation for acute stroke, right-sided weakness, dysarthria. EXAM: MRI HEAD WITHOUT CONTRAST MRA HEAD WITHOUT CONTRAST MRA NECK WITHOUT AND WITH CONTRAST TECHNIQUE: Multiplanar, multiecho pulse sequences of the brain and surrounding structures were obtained without intravenous contrast. Angiographic images of the Circle of Willis were obtained using MRA  technique without intravenous contrast. Angiographic images of the neck were obtained using MRA technique without and with intravenous contrast. Carotid stenosis measurements (when applicable) are obtained utilizing NASCET criteria, using the distal internal carotid diameter as the denominator. CONTRAST:  7.8mL GADAVIST GADOBUTROL 1 MMOL/ML IV SOLN COMPARISON:  Prior head CT from earlier the same day. FINDINGS: MRI HEAD FINDINGS Brain: Examination mildly degraded by motion artifact. Diffuse prominence of the CSF containing spaces compatible with generalized age-related cerebral atrophy. Patchy T2/FLAIR hyperintensity within the periventricular deep white matter both cerebral hemispheres most consistent with chronic small vessel ischemic disease, mild for age. Multifocal areas of restricted diffusion involving the left insular cortex, left basal ganglia, with a few additional patchy cortical infarcts within the overlying left frontal and temporal lobes, consistent with left MCA territory infarcts. These are acute to early subacute in appearance with associated T2/FLAIR signal abnormality. Associated petechial hemorrhage at the level of the left lentiform nucleus without frank hemorrhagic transformation. Diffusion abnormality/edema extending into the left cerebral peduncle and midbrain indicative of involvement of the underlying corticospinal tract. No significant mass effect. No other evidence for acute or subacute ischemia. Gray-white matter differentiation otherwise maintained. No encephalomalacia to suggest chronic cortical infarction elsewhere within the brain. No other foci of susceptibility artifact to suggest acute or chronic intracranial hemorrhage. No mass lesion, midline shift or mass effect. No hydrocephalus. No extra-axial fluid collection. Pituitary gland within normal limits. Midline structures intact. Vascular: Loss of normal flow void within the left ICA to the level of the terminus, which could be  related to slow flow and/or occlusion. Major intracranial vascular flow voids otherwise maintained. Skull and upper cervical spine: Craniocervical junction within normal limits. Bone marrow signal intensity normal. No scalp soft tissue abnormality. Sinuses/Orbits: Globes and orbital soft tissues within normal limits. Mild scattered mucosal thickening noted within the ethmoidal sinuses. Small left maxillary sinus retention cyst noted. No significant mastoid effusion. Inner ear structures grossly normal. Other: None. MRA HEAD FINDINGS ANTERIOR CIRCULATION: Examination moderately degraded by motion artifact. 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 Distal cervical segment of the right ICA widely patent with antegrade flow. Petrous, cavernous, and supraclinoid segments demonstrate atheromatous irregularity  but are patent without definite flow-limiting stenosis. Both A1 segments widely patent. Normal anterior communicating artery. Anterior cerebral arteries patent to their distal aspects without stenosis. Right M1 widely patent. Perfusion of the left M1 via collateral flow across the circle-of-Willis and/or from the posterior circulation. Left M1 widely patent as well. Grossly negative MCA bifurcations. Distal MCA branches well perfused. Right MCA branches somewhat attenuated as compared to the contralateral left. POSTERIOR CIRCULATION: Vertebral arteries widely patent to the vertebrobasilar junction without stenosis. Right vertebral artery dominant. Posterior inferior cerebral arteries patent bilaterally. Basilar widely patent to its distal aspect without stenosis. Superior cerebral arteries patent bilaterally. PCA supplied via the basilar as well as robust bilateral posterior communicating arteries. Both PCAs well perfused to their distal aspects without stenosis. No intracranial aneurysm. MRA NECK FINDINGS AORTIC ARCH: Visualized aortic arch of normal caliber with normal 3 vessel morphology. No  hemodynamically significant stenosis seen about the origin of the great vessels. Visualized subclavian arteries widely patent. RIGHT CAROTID SYSTEM: Right common carotid artery widely patent from its origin to the bifurcation without stenosis. No significant atheromatous narrowing about the right bifurcation. Right ICA widely patent distally to the skull base without stenosis or vascular occlusion. LEFT CAROTID SYSTEM: Left common carotid artery widely patent from its origin to the bifurcation. 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 Left ICA remains occluded to the circle-of-Willis. VERTEBRAL ARTERIES: Both vertebral arteries arise from the subclavian arteries. Right vertebral artery dominant and widely patent to the skull base. Multifocal moderate to severe stenoses seen involving the proximal left V1 segment, likely atherosclerotic (series 1046, image 13). Left vertebral artery otherwise widely patent distally without stenosis or vascular occlusion. Incidental note made of a 3 cm right thyroid nodule, indeterminate. IMPRESSION: MRI HEAD IMPRESSION: 1. Patchy multifocal acute to early subacute ischemic left MCA territory infarcts involving the left insular cortex, left basal ganglia, and overlying left frontal and temporal lobes as above. No significant mass effect. 2. Associated petechial hemorrhage at the left lentiform nucleus without frank hemorrhagic transformation. 3. Abnormal flow void within the left ICA to the level of the terminus, consistent with occlusion. See below. 4. Underlying mild chronic microvascular ischemic disease. MRA HEAD IMPRESSION: 1. Motion degraded exam. 2. 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 Left MCA and its branches are perfused via collateral flow across the circle-of-Willis and/or from the posterior circulation. 3. Otherwise grossly negative MRA of the head. No other large vessel occlusion. No hemodynamically significant  or correctable stenosis identified. MRA NECK IMPRESSION: 1. 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 2. Wide patency of the right carotid artery system within the neck. 3. 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 Left vertebral artery otherwise widely patent as is the dominant right vertebral artery. 4. Approximate 3 cm right thyroid nodule, indeterminate. Follow-up examination with dedicated thyroid ultrasound recommended for further evaluation. This could be performed on a nonemergent outpatient basis. Electronically Signed   By: Jeannine Boga M.D.   On: 05/17/2019 04:27   MR BRAIN WO CONTRAST  Result Date: 05/17/2019 CLINICAL DATA:  Initial evaluation for acute stroke, right-sided weakness, dysarthria. EXAM: MRI HEAD WITHOUT CONTRAST MRA HEAD WITHOUT CONTRAST MRA NECK WITHOUT AND WITH CONTRAST TECHNIQUE: Multiplanar, multiecho pulse sequences of the brain and surrounding structures were obtained without intravenous contrast. Angiographic images of the Circle of Willis were obtained using MRA technique without intravenous  contrast. Angiographic images of the neck were obtained using MRA technique without and with intravenous contrast. Carotid stenosis measurements (when applicable) are obtained utilizing NASCET criteria, using the distal internal carotid diameter as the denominator. CONTRAST:  7.63mL GADAVIST GADOBUTROL 1 MMOL/ML IV SOLN COMPARISON:  Prior head CT from earlier the same day. FINDINGS: MRI HEAD FINDINGS Brain: Examination mildly degraded by motion artifact. Diffuse prominence of the CSF containing spaces compatible with generalized age-related cerebral atrophy. Patchy T2/FLAIR hyperintensity within the periventricular deep white matter both cerebral hemispheres most consistent with chronic small vessel ischemic disease, mild for age. Multifocal areas of restricted diffusion involving the left insular cortex, left basal ganglia, with  a few additional patchy cortical infarcts within the overlying left frontal and temporal lobes, consistent with left MCA territory infarcts. These are acute to early subacute in appearance with associated T2/FLAIR signal abnormality. Associated petechial hemorrhage at the level of the left lentiform nucleus without frank hemorrhagic transformation. Diffusion abnormality/edema extending into the left cerebral peduncle and midbrain indicative of involvement of the underlying corticospinal tract. No significant mass effect. No other evidence for acute or subacute ischemia. Gray-white matter differentiation otherwise maintained. No encephalomalacia to suggest chronic cortical infarction elsewhere within the brain. No other foci of susceptibility artifact to suggest acute or chronic intracranial hemorrhage. No mass lesion, midline shift or mass effect. No hydrocephalus. No extra-axial fluid collection. Pituitary gland within normal limits. Midline structures intact. Vascular: Loss of normal flow void within the left ICA to the level of the terminus, which could be related to slow flow and/or occlusion. Major intracranial vascular flow voids otherwise maintained. Skull and upper cervical spine: Craniocervical junction within normal limits. Bone marrow signal intensity normal. No scalp soft tissue abnormality. Sinuses/Orbits: Globes and orbital soft tissues within normal limits. Mild scattered mucosal thickening noted within the ethmoidal sinuses. Small left maxillary sinus retention cyst noted. No significant mastoid effusion. Inner ear structures grossly normal. Other: None. MRA HEAD FINDINGS ANTERIOR CIRCULATION: Examination moderately degraded by motion artifact. 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 Distal cervical segment of the right ICA widely patent with antegrade flow. Petrous, cavernous, and supraclinoid segments demonstrate atheromatous irregularity but are patent without definite flow-limiting  stenosis. Both A1 segments widely patent. Normal anterior communicating artery. Anterior cerebral arteries patent to their distal aspects without stenosis. Right M1 widely patent. Perfusion of the left M1 via collateral flow across the circle-of-Willis and/or from the posterior circulation. Left M1 widely patent as well. Grossly negative MCA bifurcations. Distal MCA branches well perfused. Right MCA branches somewhat attenuated as compared to the contralateral left. POSTERIOR CIRCULATION: Vertebral arteries widely patent to the vertebrobasilar junction without stenosis. Right vertebral artery dominant. Posterior inferior cerebral arteries patent bilaterally. Basilar widely patent to its distal aspect without stenosis. Superior cerebral arteries patent bilaterally. PCA supplied via the basilar as well as robust bilateral posterior communicating arteries. Both PCAs well perfused to their distal aspects without stenosis. No intracranial aneurysm. MRA NECK FINDINGS AORTIC ARCH: Visualized aortic arch of normal caliber with normal 3 vessel morphology. No hemodynamically significant stenosis seen about the origin of the great vessels. Visualized subclavian arteries widely patent. RIGHT CAROTID SYSTEM: Right common carotid artery widely patent from its origin to the bifurcation without stenosis. No significant atheromatous narrowing about the right bifurcation. Right ICA widely patent distally to the skull base without stenosis or vascular occlusion. LEFT CAROTID SYSTEM: Left common carotid artery widely patent from its origin to the bifurcation. There is occlusion of  the left ICA at its origin at the left carotid bifurcation. Left ICA remains occluded to the circle-of-Willis. VERTEBRAL ARTERIES: Both vertebral arteries arise from the subclavian arteries. Right vertebral artery dominant and widely patent to the skull base. Multifocal moderate to severe stenoses seen involving the proximal left V1 segment, likely  atherosclerotic (series 1046, image 13). Left vertebral artery otherwise widely patent distally without stenosis or vascular occlusion. Incidental note made of a 3 cm right thyroid nodule, indeterminate. IMPRESSION: MRI HEAD IMPRESSION: 1. Patchy multifocal acute to early subacute ischemic left MCA territory infarcts involving the left insular cortex, left basal ganglia, and overlying left frontal and temporal lobes as above. No significant mass effect. 2. Associated petechial hemorrhage at the left lentiform nucleus without frank hemorrhagic transformation. 3. Abnormal flow void within the left ICA to the level of the terminus, consistent with occlusion. See below. 4. Underlying mild chronic microvascular ischemic disease. MRA HEAD IMPRESSION: 1. Motion degraded exam. 2. 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 Left MCA and its branches are perfused via collateral flow across the circle-of-Willis and/or from the posterior circulation. 3. Otherwise grossly negative MRA of the head. No other large vessel occlusion. No hemodynamically significant or correctable stenosis identified. MRA NECK IMPRESSION: 1. 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 2. Wide patency of the right carotid artery system within the neck. 3. 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 Left vertebral artery otherwise widely patent as is the dominant right vertebral artery. 4. Approximate 3 cm right thyroid nodule, indeterminate. Follow-up examination with dedicated thyroid ultrasound recommended for further evaluation. This could be performed on a nonemergent outpatient basis. Electronically Signed   By: Jeannine Boga M.D.   On: 05/17/2019 04:27   DG Chest Portable 1 View  Result Date: 05/17/2019 CLINICAL DATA:  Slurred speech, right-sided weakness EXAM: PORTABLE CHEST 1 VIEW COMPARISON:  None. FINDINGS: Mild hyperinflation of the lungs. Heart and mediastinal  contours are within normal limits. No focal opacities or effusions. No acute bony abnormality. IMPRESSION: No active disease. Electronically Signed   By: Rolm Baptise M.D.   On: 05/17/2019 00:08   ECHOCARDIOGRAM COMPLETE  Result Date: 05/17/2019   ECHOCARDIOGRAM REPORT   Patient Name:   Ruben Moore Date of Exam: 05/17/2019 Medical Rec #:  [id number]        Height:       75.0 in Accession #:    [id number]       Weight:       162.0 lb Date of Birth:  07-30-1945         BSA:          2.01 m Patient Age:    12 years         BP:           113/50 mmHg Patient Gender: M                HR:           62 bpm. Exam Location:  Inpatient Procedure: 2D Echo, Color Doppler and Cardiac Doppler Indications:    Stroke  History:        Patient has no prior history of Echocardiogram examinations.                 Risk Factors:Diabetes.  Sonographer:    Raquel Sarna Senior RDCS Referring Phys: [id number] [place]  1. Left ventricular ejection fraction, by  visual estimation, is 60 to 65%. The left ventricle has normal function. There is no left ventricular hypertrophy.  2. The left ventricle has no regional wall motion abnormalities.  3. Global right ventricle has normal systolic function.The right ventricular size is normal. No increase in right ventricular wall thickness.  4. Left atrial size was normal.  5. Right atrial size was normal.  6. The mitral valve is normal in structure. No evidence of mitral valve regurgitation. No evidence of mitral stenosis.  7. The tricuspid valve is normal in structure.  8. The tricuspid valve is normal in structure. Tricuspid valve regurgitation is trivial.  9. The aortic valve is normal in structure. Aortic valve regurgitation is not visualized. No evidence of aortic valve sclerosis or stenosis. 10. The pulmonic valve was normal in structure. Pulmonic valve regurgitation is not visualized. 11. Mildly elevated pulmonary artery systolic pressure. 12. The inferior vena cava is normal in size  with greater than 50% respiratory variability, suggesting right atrial pressure of 3 mmHg. FINDINGS  Left Ventricle: Left ventricular ejection fraction, by visual estimation, is 60 to 65%. The left ventricle has normal function. The left ventricle has no regional wall motion abnormalities. The left ventricular internal cavity size was the left ventricle is normal in size. There is no left ventricular hypertrophy. Left ventricular diastolic parameters were normal. Normal left atrial pressure. Right Ventricle: The right ventricular size is normal. No increase in right ventricular wall thickness. Global RV systolic function is has normal systolic function. The tricuspid regurgitant velocity is 2.76 m/s, and with an assumed right atrial pressure  of 3 mmHg, the estimated right ventricular systolic pressure is mildly elevated at 33.5 mmHg. Left Atrium: Left atrial size was normal in size. Right Atrium: Right atrial size was normal in size Pericardium: There is no evidence of pericardial effusion. Mitral Valve: The mitral valve is normal in structure. No evidence of mitral valve regurgitation. No evidence of mitral valve stenosis by observation. Tricuspid Valve: The tricuspid valve is normal in structure. Tricuspid valve regurgitation is trivial. Aortic Valve: The aortic valve is normal in structure. Aortic valve regurgitation is not visualized. The aortic valve is structurally normal, with no evidence of sclerosis or stenosis. Pulmonic Valve: The pulmonic valve was normal in structure. Pulmonic valve regurgitation is not visualized. Pulmonic regurgitation is not visualized. Aorta: The aortic root, ascending aorta and aortic arch are all structurally normal, with no evidence of dilitation or obstruction. Venous: The inferior vena cava is normal in size with greater than 50% respiratory variability, suggesting right atrial pressure of 3 mmHg. IAS/Shunts: No atrial level shunt detected by color flow Doppler. There is no  evidence of a patent foramen ovale. No ventricular septal defect is seen or detected. There is no evidence of an atrial septal defect.  LEFT VENTRICLE PLAX 2D LVIDd:         3.80 cm  Diastology LVIDs:         2.50 cm  LV e' lateral:   8.49 cm/s LV PW:         0.90 cm  LV E/e' lateral: 9.1 LV IVS:        1.00 cm  LV e' medial:    6.31 cm/s LVOT diam:     2.00 cm  LV E/e' medial:  12.2 LV SV:         40 ml LV SV Index:   20.21 LVOT Area:     3.14 cm  RIGHT VENTRICLE RV S prime:  13.40 cm/s TAPSE (M-mode): 2.1 cm LEFT ATRIUM             Index LA diam:        2.40 cm 1.20 cm/m LA Vol (A2C):   57.8 ml 28.81 ml/m LA Vol (A4C):   34.5 ml 17.19 ml/m LA Biplane Vol: 49.2 ml 24.52 ml/m  AORTIC VALVE LVOT Vmax:   97.50 cm/s LVOT Vmean:  62.900 cm/s LVOT VTI:    0.206 m  AORTA Ao Root diam: 2.30 cm MITRAL VALVE                        TRICUSPID VALVE MV Area (PHT): 2.95 cm             TR Peak grad:   30.5 mmHg MV PHT:        74.53 msec           TR Vmax:        276.00 cm/s MV Decel Time: 257 msec MV E velocity: 77.10 cm/s 103 cm/s  SHUNTS MV A velocity: 79.50 cm/s 70.3 cm/s Systemic VTI:  0.21 m MV E/A ratio:  0.97       1.5       Systemic Diam: 2.00 cm  Mihai Croitoru MD Electronically signed by Sanda Klein MD Signature Date/Time: 05/17/2019/10:54:47 AM    Final      Subjective: - no chest pain, shortness of breath, no abdominal pain, nausea or vomiting.   Discharge Exam: BP 111/65 (BP Location: Right Arm)    Pulse 61    Temp 98.5 F (36.9 C)    Resp 16    Ht 6\' 3"  (1.905 m)    Wt 73.5 kg    SpO2 97%    BMI 20.25 kg/m   General: Pt is alert, awake, not in acute distress Cardiovascular: RRR, S1/S2 +, no rubs, no gallops Respiratory: CTA bilaterally, no wheezing, no rhonchi Abdominal: Soft, NT, ND, bowel sounds + Extremities: no edema, no cyanosis    The results of significant diagnostics from this hospitalization (including imaging, microbiology, ancillary and laboratory) are listed below for  reference.     Microbiology: Recent Results (from the past 240 hour(s))  SARS CORONAVIRUS 2 (TAT 6-24 HRS) Nasopharyngeal Nasopharyngeal Swab     Status: None   Collection Time: 05/17/19 12:03 AM   Specimen: Nasopharyngeal Swab  Result Value Ref Range Status   SARS Coronavirus 2 NEGATIVE NEGATIVE Final    Comment: (NOTE) SARS-CoV-2 target nucleic acids are NOT DETECTED. The SARS-CoV-2 RNA is generally detectable in upper and lower respiratory specimens during the acute phase of infection. Negative results do not preclude SARS-CoV-2 infection, do not rule out co-infections with other pathogens, and should not be used as the sole basis for treatment or other patient management decisions. Negative results must be combined with clinical observations, patient history, and epidemiological information. The expected result is Negative. Fact Sheet for Patients: SugarRoll.be Fact Sheet for Healthcare Providers: https://www.woods-mathews.com/ This test is not yet approved or cleared by the [place] FDA and  has been authorized for detection and/or diagnosis of SARS-CoV-2 by FDA under an Emergency Use Authorization (EUA). This EUA will remain  in effect (meaning this test can be used) for the duration of the COVID-19 declaration under Section 56 4(b)(1) of the Act, 21 U.S.C. section 360bbb-3(b)(1), unless the authorization is terminated or revoked sooner. Performed at [facility], [place] [address] [place], [place] [address]  Labs: Basic Metabolic Panel: Recent Labs  Lab 05/17/19 0007 05/17/19 0612 05/17/19 0909 05/17/19 1239 05/19/19 0232  NA 135 139  --   --  136  K 6.0* 3.3* 3.5 3.5 3.5  CL 102 101  --   --  103  CO2  --  26  --   --  25  GLUCOSE 90 92  --   --  103*  BUN 31* 18  --   --  17  CREATININE 1.20 1.25*  --   --  1.20  CALCIUM  --  8.8*  --   --  8.6*   Liver Function Tests: Recent Labs  Lab 05/17/19 0612   AST 19  ALT 13  ALKPHOS 102  BILITOT 1.5*  PROT 6.2*  ALBUMIN 3.0*   CBC: Recent Labs  Lab 05/16/19 2347 05/17/19 0007 05/19/19 0232  WBC 13.7*  --  11.0*  NEUTROABS 9.3*  --   --   HGB 15.0 16.0 13.0  HCT 46.8 47.0 40.6  MCV 85.4  --  85.5  PLT 251  --  224   CBG: Recent Labs  Lab 05/19/19 0732 05/19/19 1223 05/19/19 1618 05/19/19 2038 05/20/19 0829  GLUCAP 89 105* 108* 100* 96   Hgb A1c No results for input(s): HGBA1C in the last 72 hours. Lipid Profile No results for input(s): CHOL, HDL, LDLCALC, TRIG, CHOLHDL, LDLDIRECT in the last 72 hours. Thyroid function studies No results for input(s): TSH, T4TOTAL, T3FREE, THYROIDAB in the last 72 hours.  Invalid input(s): FREET3 Urinalysis    Component Value Date/Time   COLORURINE YELLOW 05/17/2019 [place] 05/17/2019 0219   LABSPEC 1.025 05/17/2019 0219   PHURINE 5.0 05/17/2019 0219   GLUCOSEU NEGATIVE 05/17/2019 0219   HGBUR NEGATIVE 05/17/2019 0219   BILIRUBINUR NEGATIVE 05/17/2019 0219   KETONESUR 5 (A) 05/17/2019 0219   PROTEINUR NEGATIVE 05/17/2019 0219   NITRITE NEGATIVE 05/17/2019 0219   LEUKOCYTESUR NEGATIVE 05/17/2019 0219    FURTHER DISCHARGE INSTRUCTIONS:   Get Medicines reviewed and adjusted: Please take all your medications with you for your next visit with your Primary MD   Laboratory/radiological data: Please request your Primary MD to go over all hospital tests and procedure/radiological results at the follow up, please ask your Primary MD to get all Hospital records sent to his/her office.   In some cases, they will be blood work, cultures and biopsy results pending at the time of your discharge. Please request that your primary care M.D. goes through all the records of your hospital data and follows up on these results.   Also Note the following: If you experience worsening of your admission symptoms, develop shortness of breath, life threatening emergency, suicidal or  homicidal thoughts you must seek medical attention immediately by calling 911 or calling your MD immediately  if symptoms less severe.   You must read complete instructions/literature along with all the possible adverse reactions/side effects for all the Medicines you take and that have been prescribed to you. Take any new Medicines after you have completely understood and accpet all the possible adverse reactions/side effects.    Do not drive when taking Pain medications or sleeping medications (Benzodaizepines)   Do not take more than prescribed Pain, Sleep and Anxiety Medications. It is not advisable to combine anxiety,sleep and pain medications without talking with your primary care practitioner   Special Instructions: If you have smoked or chewed Tobacco  in the last 2 yrs please stop smoking, stop  any regular Alcohol  and or any Recreational drug use.   Wear Seat belts while driving.   Please note: You were cared for by a hospitalist during your hospital stay. Once you are discharged, your primary care physician will handle any further medical issues. Please note that NO REFILLS for any discharge medications will be authorized once you are discharged, as it is imperative that you return to your primary care physician (or establish a relationship with a primary care physician if you do not have one) for your post hospital discharge needs so that they can reassess your need for medications and monitor your lab values.  Time coordinating discharge: 40 minutes  SIGNED:  Marzetta Board, MD, PhD 05/20/2019, 9:31 AM

## 2019-05-19 NOTE — Progress Notes (Signed)
Inpatient Rehab Admissions Coordinator:   Was able to confirm pt has Medicare Part A, which can be used for CIR.  Confirmed dispo with pt's daughter, Solmon Ice.  Will know about bed availability for today versus tomorrow later this afternoon.  Will let CM know.   Shann Medal, PT, DPT Admissions Coordinator [phone] 05/19/19  10:18 AM

## 2019-05-19 NOTE — PMR Pre-admission (Signed)
PMR Admission Coordinator Pre-Admission Assessment  Patient: Ruben Moore is an 74 y.o., male MRN: [id number] DOB: 1945-06-10 Height: '6\' 3"'$  (190.5 cm) Weight: 73.5 kg  Insurance Information HMO:     PPO:      PCP:      IPA:      80/20:      OTHER:  PRIMARY: Medicare Part A      Policy#: [id number]      Subscriber: patient CM Name:       Phone#:      Fax#:  Pre-Cert#: verified eligibility online      Employer:  Benefits:  Phone #:      Name:  Eff. Date: 01/20/12     Deduct: $1484      Out of Pocket Max: n/a      Life Max: n/a CIR: 100%      SNF: 20 full days Outpatient: no coverage     Co-Pay:  Home Health: 100%      Co-Pay:  DME: no coverage     Co-Pay:  Providers: pt choice SECONDARY: [facility] Policy#: [id number]      Subscriber: pt CM Name:       Phone#:      Fax#:  Pre-Cert#:       Employer:  Benefits:  Phone #:      Name:  Eff. Date:      Deduct:       Out of Pocket Max:       Life Max:  CIR:       SNF:  Outpatient:      Co-Pay:  Home Health:       Co-Pay:  DME:      Co-Pay:   Medicaid Application Date:       Case Manager:  Disability Application Date:       Case Worker:   The "Data Collection Information Summary" for patients in Inpatient Rehabilitation Facilities with attached "Privacy Act [place] Records" was provided and verbally reviewed with: Patient and Family  Emergency Contact Information Contact Information    Name Relation Home Work Mobile   Ruben Moore Daughter [phone]  [phone]      Current Medical History  Patient Admitting Diagnosis: L basal ganglia CVA  History of Present Illness: Ruben Moore a 74 y.o.right-handed malewith history of bladder cancer, diabetes mellitus, tobacco abuse.Per chart review patient lives alone independent prior to admission. He is retired. He does have a daughter in the area that works.Presented 05/17/2019 with right-sided weakness and dysarthria. Noted systolic blood pressure in the 150s.  SARS coronavirus negative, potassium 6.0, troponin negative, urine drug screen positive marijuana, alcohol level negative. Cranial CT scan showed infarction within the left insular cortex left basal ganglia corona radiata likely acute to subacute. No midline shift. Patient did not receive TPA. MRI of the brain patchy multifocal acute to early subacute ischemia left MCA territory. 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 Echocardiogram pending. CT angiogram of head and neck pending. Neurology consulted presently on aspirin 325 mg daily for CVA prophylaxis and Lovenox for DVT prophylaxis.  Therapy evaluations were completed and pt was recommended for CIR.   Complete NIHSS TOTAL: 5  Patient's medical record from [facility] has been reviewed by the rehabilitation admission coordinator and physician.  Past Medical History  Past Medical History:  Diagnosis Date  . Bladder cancer ([place])   . Diabetes mellitus without complication ([place])   . Stroke ([place]) 05/16/2019  .  Tobacco use     Family History   family history includes Diabetes in his brother.  Prior Rehab/Hospitalizations Has the patient had prior rehab or hospitalizations prior to admission? Yes treated for bladder cancer in 2019  Has the patient had major surgery during 100 days prior to admission? No   Current Medications  Current Facility-Administered Medications:  .  0.9 %  sodium chloride infusion, , Intravenous, Continuous, Rosalin Hawking, MD, Last Rate: 50 mL/hr at 05/19/19 1833, New Bag at 05/19/19 1833 .  acetaminophen (TYLENOL) tablet 650 mg, 650 mg, Oral, Q4H PRN **OR** acetaminophen (TYLENOL) 160 MG/5ML solution 650 mg, 650 mg, Per Tube, Q4H PRN **OR** acetaminophen (TYLENOL) suppository 650 mg, 650 mg, Rectal, Q4H PRN, Opyd, Timothy S, MD .  aspirin EC tablet 325 mg, 325 mg, Oral, Daily, Rosalin Hawking, MD, 325 mg at 05/20/19 0837 .  atorvastatin (LIPITOR) tablet 40 mg, 40 mg, Oral, q1800,  Rosalin Hawking, MD, 40 mg at 05/19/19 1757 .  clopidogrel (PLAVIX) tablet 75 mg, 75 mg, Oral, Daily, Rosalin Hawking, MD, 75 mg at 05/20/19 0837 .  enoxaparin (LOVENOX) injection 40 mg, 40 mg, Subcutaneous, QHS, Opyd, Ilene Qua, MD, 40 mg at 05/20/19 0838 .  feeding supplement (ENSURE ENLIVE) (ENSURE ENLIVE) liquid 237 mL, 237 mL, Oral, BID BM, Opyd, Ilene Qua, MD, 237 mL at 05/19/19 1448 .  insulin aspart (novoLOG) injection 0-5 Units, 0-5 Units, Subcutaneous, QHS, Opyd, Timothy S, MD .  insulin aspart (novoLOG) injection 0-9 Units, 0-9 Units, Subcutaneous, TID WC, Opyd, Timothy S, MD .  senna-docusate (Senokot-S) tablet 1 tablet, 1 tablet, Oral, QHS PRN, Opyd, Ilene Qua, MD .  thiamine tablet 100 mg, 100 mg, Oral, Daily, Domenic Polite, MD, 100 mg at 05/20/19 4098  Patients Current Diet:  Diet Order            Diet heart healthy/carb modified Room service appropriate? Yes with Assist; Fluid consistency: Thin  Diet effective now              Precautions / Restrictions Precautions Precautions: Fall Precaution Comments: R hemiplegia Restrictions Weight Bearing Restrictions: No   Has the patient had 2 or more falls or a fall with injury in the past year? No  Prior Activity Level Community (5-7x/wk): active prior to admission, driving, living alone  Prior Functional Level Self Care: Did the patient need help bathing, dressing, using the toilet or eating? Independent  Indoor Mobility: Did the patient need assistance with walking from room to room (with or without device)? Independent  Stairs: Did the patient need assistance with internal or external stairs (with or without device)? Independent  Functional Cognition: Did the patient need help planning regular tasks such as shopping or remembering to take medications? Independent  Home Assistive Devices / Equipment Home Assistive Devices/Equipment: Eyeglasses, Dentures (specify type) Home Equipment: None  Prior Device Use: Indicate  devices/aids used by the patient prior to current illness, exacerbation or injury? None of the above  Current Functional Level Cognition  Arousal/Alertness: Awake/alert Overall Cognitive Status: Impaired/Different from baseline Current Attention Level: Sustained Orientation Level: Oriented X4 Following Commands: Follows one step commands consistently, Follows multi-step commands with increased time Safety/Judgement: Decreased awareness of safety, Decreased awareness of deficits General Comments: cues for sequencing with sponge bathing at sink in room, cues for R side awareness Attention: Sustained Sustained Attention: Appears intact    Extremity Assessment (includes Sensation/Coordination)  Upper Extremity Assessment: Defer to OT evaluation RUE Deficits / Details: grossly 3-/5 MMT, limited coordination and impaired  sensation, decreased awareness to UE and requires cueing and physical support to use functionally RUE Sensation: decreased light touch, decreased proprioception RUE Coordination: decreased gross motor, decreased fine motor  Lower Extremity Assessment: RLE deficits/detail RLE Deficits / Details: grossly 3-/5 t/o LE RLE Sensation: (impaired by 30%) RLE Coordination: decreased fine motor, decreased gross motor    ADLs  Overall ADL's : Needs assistance/impaired Eating/Feeding: NPO Grooming: Moderate assistance, Sitting Upper Body Bathing: Sitting, Moderate assistance Lower Body Bathing: Moderate assistance, Sit to/from stand Upper Body Dressing : Moderate assistance, Sitting Lower Body Dressing: Maximal assistance, +2 for physical assistance, +2 for safety/equipment, Sit to/from stand Lower Body Dressing Details (indicate cue type and reason): requires assist for socks, min assist +2 sit to stand  Toilet Transfer: +2 for physical assistance, +2 for safety/equipment, Moderate assistance, Ambulation Toilet Transfer Details (indicate cue type and reason): simulated to recliner   Functional mobility during ADLs: Moderate assistance, Minimal assistance, +2 for physical assistance, +2 for safety/equipment General ADL Comments: patient limited by R hemiparesis, impaired balance, decreased activity tolerance and R inattention, as well as cognition     Mobility  Overal bed mobility: Needs Assistance Bed Mobility: Supine to Sit Supine to sit: Supervision, HOB elevated General bed mobility comments: up in chair    Transfers  Overall transfer level: Needs assistance Equipment used: Rolling walker (2 wheeled) Transfers: Sit to/from Stand Sit to Stand: Min assist General transfer comment: cues for hand placement for lifting, assist for rising    Ambulation / Gait / Stairs / Wheelchair Mobility  Ambulation/Gait Ambulation/Gait assistance: Mod assist Gait Distance (Feet): 150 Feet(& 10') Assistive device: Rolling walker (2 wheeled) Gait Pattern/deviations: Step-through pattern, Decreased stance time - right, Decreased weight shift to left, Shuffle, Ataxic General Gait Details: R knee hyperextension in terminal stance on L cues for shorter steps, but pt not aware, facilitation for L lateral weight shift for improved swing on R Gait velocity: dec Gait velocity interpretation: <1.8 ft/sec, indicate of risk for recurrent falls    Posture / Balance Dynamic Sitting Balance Sitting balance - Comments: able to reach forward to attempt donning socks with supervision  Balance Overall balance assessment: Needs assistance Sitting-balance support: No upper extremity supported Sitting balance-Leahy Scale: Fair Sitting balance - Comments: able to reach forward to attempt donning socks with supervision  Standing balance support: Single extremity supported, Bilateral upper extremity supported Standing balance-Leahy Scale: Poor Standing balance comment: UE support and assist for balance    Special needs/care consideration BiPAP/CPAP no CPM no Continuous Drip IV no Dialysis no         Days n/a Life Vest no Oxygen no Special Bed no Trach Size no Wound Vac (area) no      Location n/a Skin intact                              Location n/a Bowel mgmt: continent Bladder mgmt: continent Diabetic mgmt: yes Behavioral consideration no Chemo/radiation no   Previous Home Environment (from acute therapy documentation) Living Arrangements: Alone Available Help at Discharge: Family Type of Home: Apartment Home Layout: One level Home Access: Level entry Bathroom Shower/Tub: Chiropodist: Standard Home Care Services: No Additional Comments: daughter, Solmon Ice, called during session, reports 24/7 can be arranged  Discharge Living Setting Plans for Discharge Living Setting: Patient's home Type of Home at Discharge: Apartment Discharge Home Layout: One level Discharge Home Access: Stairs to enter Entrance Stairs-Rails: None Entrance  Stairs-Number of Steps: 1+1 curb steps? Discharge Bathroom Shower/Tub: Tub/shower unit Discharge Bathroom Toilet: Standard Discharge Bathroom Accessibility: Yes How Accessible: Accessible via walker Does the patient have any problems obtaining your medications?: No  Social/Family/Support Systems Anticipated Caregiver: daughter, Timmy Bubeck Anticipated Caregiver's Contact Information: [phone] Ability/Limitations of Caregiver: n/a Caregiver Availability: 24/7 Discharge Plan Discussed with Primary Caregiver: Yes Is Caregiver In Agreement with Plan?: Yes Does Caregiver/Family have Issues with Lodging/Transportation while Pt is in Rehab?: No  Goals/Additional Needs Patient/Family Goal for Rehab: PT/OT/SLP supervision to mod I Expected length of stay: 12-14 days Dietary Needs: carb modified/heart healthy Additional Information: Has VA and Medicare part A benefits Pt/Family Agrees to Admission and willing to participate: Yes Program Orientation Provided & Reviewed with Pt/Caregiver Including Roles  &  Responsibilities: Yes  Decrease burden of Care through IP rehab admission: n/a  Possible need for SNF placement upon discharge: Not anticipated.   Patient Condition: I have reviewed medical records from [facility], spoken with CM, and patient and daughter. I met with patient at the bedside and discussed via phone for inpatient rehabilitation assessment.  Patient will benefit from ongoing PT, OT and SLP, can actively participate in 3 hours of therapy a day 5 days of the week, and can make measurable gains during the admission.  Patient will also benefit from the coordinated team approach during an Inpatient Acute Rehabilitation admission.  The patient will receive intensive therapy as well as Rehabilitation physician, nursing, social worker, and care management interventions.  Due to safety, disease management, medication administration, pain management and patient education the patient requires 24 hour a day rehabilitation nursing.  The patient is currently mod assist with mobility and basic ADLs.  Discharge setting and therapy post discharge at home is anticipated.  Patient has agreed to participate in the Acute Inpatient Rehabilitation Program and will admit today.  Preadmission Screen Completed By:  Michel Santee, PT, DPT 05/20/2019 9:23 AM ______________________________________________________________________   Discussed status with Dr. Posey Pronto on 05/20/19  at 9:23 AM  and received approval for admission today.  Admission Coordinator:  Michel Santee, PT, DPT time 9:23 AM Sudie Grumbling 05/20/19    Assessment/Plan: Diagnosis: L basal ganglia CVA  1. Does the need for close, 24 hr/day Medical supervision in concert with the patient's rehab needs make it unreasonable for this patient to be served in a less intensive setting? Yes Co-Morbidities requiring supervision/potential complications: bladder cancer, DM (Monitor in accordance with exercise and adjust meds as necessary), tobacco abuse  (counsel), hypokalemia (continue to monitor and replete as necessary) 2. Due to bladder management, bowel management, disease management and patient education, does the patient require 24 hr/day rehab nursing? Yes 3. Does the patient require coordinated care of a physician, rehab nurse, PT, OT, and SLP to address physical and functional deficits in the context of the above medical diagnosis(es)? Yes Addressing deficits in the following areas: balance, endurance, locomotion, strength, transferring, bathing, dressing, toileting, swallowing and psychosocial support 4. Can the patient actively participate in an intensive therapy program of at least 3 hrs of therapy 5 days a week? Yes 5. The potential for patient to make measurable gains while on inpatient rehab is excellent 6. Anticipated functional outcomes upon discharge from inpatient rehab: supervision PT, supervision OT, modified independent SLP 7. Estimated rehab length of stay to reach the above functional goals is: 10-15 days. 8. Anticipated discharge destination: Home 10. Overall Rehab/Functional Prognosis: good   MD Signature: Delice Lesch, MD, ABPMR

## 2019-05-19 NOTE — H&P (Addendum)
Physical Medicine and Rehabilitation Admission H&P     HPI: Ruben Moore is a 74 year old right-handed male with history of bladder cancer, chronic left carotid occlusion followed at the [facility], diet controlled diabetes mellitus and tobacco abuse.  History taken from chart review and patient due to expressive aphasia.  Patient on no prescription medications.  Patient lives alone independent prior to admission.  He is retired.  He does have a daughter in the area that works.  He presented on 05/17/2019 with right hemiparesis and dysarthria.  Noted systolic blood pressure in the 150s.  SARS coronavirus negative, potassium 6.0, troponin negative, urine drug screen positive marijuana, alcohol level negative.  Cranial CT scan showed left insular cortex, left basal ganglia, corona radiata infarct  likely acute to subacute.  No midline shift.  Patient did not receive TPA.  MRI of the brain patchy multifocal acute to early subacute ischemia left MCA territory.  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  CTA of head and neck left ICA occlusion at its origin.  No significant intracranial stenosis.  Incidental finding of a 3.3 cm right thyroid nodule recommend thyroid ultrasound as outpatient.  Echocardiogram with EF of 65% without emboli. Neurology consulted maintain on aspirin 325 mg daily and Plavix 75 mg daily for 3 months then aspirin alone.  Subcutaneous Lovenox for DVT prophylaxis.  Tolerating regular diet.  Therapy evaluations completed and patient was admitted for a comprehensive rehab program.  Please see preadmission assessment from earlier today as well.  Review of Systems  Constitutional: Negative for chills and fever.  HENT: Negative for hearing loss.   Eyes: Negative for blurred vision and double vision.  Respiratory: Negative for cough and shortness of breath.   Cardiovascular: Negative for palpitations and leg swelling.  Gastrointestinal: Positive for  constipation. Negative for heartburn, nausea and vomiting.  Genitourinary: Positive for urgency. Negative for dysuria, flank pain and hematuria.  Musculoskeletal: Positive for joint pain and myalgias.  Skin: Negative for rash.  Neurological: Positive for sensory change, speech change, focal weakness and weakness.   Past Medical History:  Diagnosis Date  . Bladder cancer ([place])   . Diabetes mellitus without complication ([place])   . Stroke ([place]) 05/16/2019  . Tobacco use    History reviewed. No pertinent surgical history. Family History  Problem Relation Age of Onset  . Diabetes Brother    Social History:  reports that he has quit smoking. He has quit using smokeless tobacco. He reports previous alcohol use. He reports current drug use. Drug: Marijuana. Allergies: No Known Allergies No medications prior to admission.    Drug Regimen Review Drug regimen was reviewed and remains appropriate with no significant issues identified  Home: Home Living Family/patient expects to be discharged to:: Private residence Living Arrangements: Alone Available Help at Discharge: Family Type of Home: Apartment Home Access: Level entry Home Layout: One level Bathroom Shower/Tub: Chiropodist: Standard Home Equipment: None Additional Comments: daughter, Solmon Ice, called during session, reports 24/7 can be arranged   Functional History: Prior Function Level of Independence: Independent Comments: driving, independnet IADLs   Functional Status:  Mobility: Bed Mobility Overal bed mobility: Needs Assistance Bed Mobility: Supine to Sit Supine to sit: Supervision, HOB elevated General bed mobility comments: up in chair Transfers Overall transfer level: Needs assistance Equipment used: Rolling walker (2 wheeled) Transfers: Sit to/from Stand Sit to Stand: Min assist General transfer comment: cues for hand placement for lifting, assist  for  rising Ambulation/Gait Ambulation/Gait assistance: Mod assist Gait Distance (Feet): 150 Feet(& 10') Assistive device: Rolling walker (2 wheeled) Gait Pattern/deviations: Step-through pattern, Decreased stance time - right, Decreased weight shift to left, Shuffle, Ataxic General Gait Details: R knee hyperextension in terminal stance on L cues for shorter steps, but pt not aware, facilitation for L lateral weight shift for improved swing on R Gait velocity: dec Gait velocity interpretation: <1.8 ft/sec, indicate of risk for recurrent falls    ADL: ADL Overall ADL's : Needs assistance/impaired Eating/Feeding: NPO Grooming: Moderate assistance, Sitting Upper Body Bathing: Sitting, Moderate assistance Lower Body Bathing: Moderate assistance, Sit to/from stand Upper Body Dressing : Moderate assistance, Sitting Lower Body Dressing: Maximal assistance, +2 for physical assistance, +2 for safety/equipment, Sit to/from stand Lower Body Dressing Details (indicate cue type and reason): requires assist for socks, min assist +2 sit to stand  Toilet Transfer: +2 for physical assistance, +2 for safety/equipment, Moderate assistance, Ambulation Toilet Transfer Details (indicate cue type and reason): simulated to recliner  Functional mobility during ADLs: Moderate assistance, Minimal assistance, +2 for physical assistance, +2 for safety/equipment General ADL Comments: patient limited by R hemiparesis, impaired balance, decreased activity tolerance and R inattention, as well as cognition   Cognition: Cognition Overall Cognitive Status: Impaired/Different from baseline Arousal/Alertness: Awake/alert Orientation Level: Oriented X4 Attention: Sustained Sustained Attention: Appears intact Cognition Arousal/Alertness: Awake/alert Behavior During Therapy: Flat affect Overall Cognitive Status: Impaired/Different from baseline Area of Impairment: Attention, Safety/judgement, Problem solving, Following  commands, Memory Orientation Level: Disoriented to, Time Current Attention Level: Sustained Memory: Decreased short-term memory, Decreased recall of precautions Following Commands: Follows one step commands consistently, Follows multi-step commands with increased time Safety/Judgement: Decreased awareness of safety, Decreased awareness of deficits Awareness: Emergent Problem Solving: Slow processing, Requires verbal cues, Difficulty sequencing General Comments: cues for sequencing with sponge bathing at sink in room, cues for R side awareness  Physical Exam: Blood pressure 111/65, pulse 61, temperature 98.5 F (36.9 C), resp. rate 16, height 6\' 3"  (1.905 m), weight 73.5 kg, SpO2 97 %. Physical Exam  Vitals reviewed. Constitutional: He is oriented to person, place, and time. He appears well-developed and well-nourished.  HENT:  Head: Normocephalic and atraumatic.  Eyes: EOM are normal. Right eye exhibits no discharge. Left eye exhibits no discharge.  Neck: No tracheal deviation present. No thyromegaly present.  Respiratory: Effort normal. No stridor. No respiratory distress.  GI: Soft. He exhibits no distension.  Musculoskeletal:     Comments: No edema or tenderness in extremities  Neurological: He is alert and oriented to person, place, and time.  Patient is alert in no acute distress.   Follows commands and makes good eye contact with examiner.   Dysarthria Fair awareness of his deficits. Right facial weakness Motor: Right upper extremity: 3+/5 proximal distal Right lower extremity: 4-4+/5 proximal distal Left upper extremity/left lower extremity: 5/5 proximal distal Expressive aphasia  Skin: Skin is warm and dry.  Psychiatric: He has a normal mood and affect. His behavior is normal.    Results for orders placed or performed during the hospital encounter of 05/16/19 (from the past 48 hour(s))  Glucose, capillary     Status: Abnormal   Collection Time: 05/18/19 12:56 PM   Result Value Ref Range   Glucose-Capillary 114 (H) 70 - 99 mg/dL  Glucose, capillary     Status: None   Collection Time: 05/18/19  4:54 PM  Result Value Ref Range   Glucose-Capillary 90 70 - 99 mg/dL  Glucose, capillary  Status: None   Collection Time: 05/18/19  9:01 PM  Result Value Ref Range   Glucose-Capillary 99 70 - 99 mg/dL  CBC     Status: Abnormal   Collection Time: 05/19/19  2:32 AM  Result Value Ref Range   WBC 11.0 (H) 4.0 - 10.5 K/uL   RBC 4.75 4.22 - 5.81 MIL/uL   Hemoglobin 13.0 13.0 - 17.0 g/dL   HCT 40.6 39.0 - 52.0 %   MCV 85.5 80.0 - 100.0 fL   MCH 27.4 26.0 - 34.0 pg   MCHC 32.0 30.0 - 36.0 g/dL   RDW 14.1 11.5 - 15.5 %   Platelets 224 150 - 400 K/uL   nRBC 0.0 0.0 - 0.2 %    Comment: Performed at [facility], [place] [address], [place], [place] [id number]  Basic metabolic panel     Status: Abnormal   Collection Time: 05/19/19  2:32 AM  Result Value Ref Range   Sodium 136 135 - 145 mmol/L   Potassium 3.5 3.5 - 5.1 mmol/L   Chloride 103 98 - 111 mmol/L   CO2 25 22 - 32 mmol/L   Glucose, Bld 103 (H) 70 - 99 mg/dL   BUN 17 8 - 23 mg/dL   Creatinine, Ser 1.20 0.61 - 1.24 mg/dL   Calcium 8.6 (L) 8.9 - 10.3 mg/dL   GFR calc non Af Amer 60 (L) >60 mL/min   GFR calc Af Amer >60 >60 mL/min   Anion gap 8 5 - 15    Comment: Performed at [facility] [address] [place], [place] [address]  Glucose, capillary     Status: None   Collection Time: 05/19/19  7:32 AM  Result Value Ref Range   Glucose-Capillary 89 70 - 99 mg/dL  Glucose, capillary     Status: Abnormal   Collection Time: 05/19/19 12:23 PM  Result Value Ref Range   Glucose-Capillary 105 (H) 70 - 99 mg/dL  Glucose, capillary     Status: Abnormal   Collection Time: 05/19/19  4:18 PM  Result Value Ref Range   Glucose-Capillary 108 (H) 70 - 99 mg/dL  Glucose, capillary     Status: Abnormal   Collection Time: 05/19/19  8:38 PM  Result Value Ref Range   Glucose-Capillary 100 (H) 70 -  99 mg/dL  Glucose, capillary     Status: None   Collection Time: 05/20/19  8:29 AM  Result Value Ref Range   Glucose-Capillary 96 70 - 99 mg/dL   No results found.     Medical Problem List and Plan: 1.  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  -patient may shower  -ELOS/Goals: 10-15 days/Supervision  Admit to CIR 2.  Antithrombotics: -DVT/anticoagulation: Lovenox  -antiplatelet therapy: Aspirin 325 mg daily and Plavix 75 mg daily x3 months and aspirin alone 3. Pain Management: Tylenol as needed 4. Mood: Provide emotional support  -antipsychotic agents: N/A 5. Neuropsych: This patient is ?fully capable of making decisions on his own behalf. 6. Skin/Wound Care: Routine skin checks 7. Fluids/Electrolytes/Nutrition: Routine in and outs. CMP ordered. 8.  History of tobacco abuse as well as marijuana.  Provide counseling 9.  Hyperlipidemia.  Cont Lipitor.  10.  Diabetes mellitus.  Hemoglobin A1c 5.4.  SSI.  Patient on no medications prior to admission  Monitor with increased mobility.  11.  3.3 cm right thyroid nodule.  Follow-up  as outpatient with ultrasound  Cathlyn Parsons, PA-C 05/20/2019  I have personally performed a face to face diagnostic evaluation, including, but not limited to relevant history and physical exam findings, of this patient and developed relevant assessment and plan.  Additionally, I have reviewed and concur with the physician assistant's documentation above.  Delice Lesch, MD, ABPMR

## 2019-05-19 NOTE — Progress Notes (Signed)
Physical Therapy Treatment Patient Details Name: Ruben Moore MRN: [id number] DOB: 1945/12/23 Today's Date: 05/19/2019    History of Present Illness Pt is a 74 y/o male with PMH of DM presenting to ED with R sided weakness and dysarthria. MRI reveals "large left basal ganglia infarct as well as smaller acute and subacute infarctions in the left MCA territory infarcts including the insular cortex frontal and temporal lobes with mild petechial hemorrhage.     PT Comments    Patient progressing with mobility, endurance and balance.  Still with R inattention and knee hyperextension at high risk for falls due to decreased awareness.  Remains excellent candidate for CIR level rehab.  PT to follow until d/c.    Follow Up Recommendations  CIR     Equipment Recommendations  Other (comment)(TBA)    Recommendations for Other Services       Precautions / Restrictions Precautions Precautions: Fall Precaution Comments: R hemiplegia    Mobility  Bed Mobility Overal bed mobility: Needs Assistance Bed Mobility: Supine to Sit     Supine to sit: Supervision;HOB elevated        Transfers Overall transfer level: Needs assistance Equipment used: Rolling walker (2 wheeled) Transfers: Sit to/from Stand Sit to Stand: Min assist            Ambulation/Gait Ambulation/Gait assistance: Mod assist Gait Distance (Feet): 150 Feet(& 10') Assistive device: Rolling walker (2 wheeled) Gait Pattern/deviations: Step-through pattern;Decreased stance time - right;Decreased weight shift to left;Shuffle;Ataxic     General Gait Details: R knee hyperextension in terminal stance on L cues for shorter steps, but pt not aware, facilitation for L lateral weight shift for improved swing on R   Stairs             Wheelchair Mobility    Modified Rankin (Stroke Patients Only) Modified Rankin (Stroke Patients Only) Pre-Morbid Rankin Score: No symptoms Modified Rankin: Moderately severe  disability     Balance Overall balance assessment: Needs assistance Sitting-balance support: No upper extremity supported Sitting balance-Leahy Scale: Fair     Standing balance support: Single extremity supported;Bilateral upper extremity supported Standing balance-Leahy Scale: Poor Standing balance comment: UE support and assist for balance                            Cognition Arousal/Alertness: Awake/alert Behavior During Therapy: Flat affect Overall Cognitive Status: Impaired/Different from baseline Area of Impairment: Attention;Safety/judgement;Problem solving;Following commands;Memory                   Current Attention Level: Sustained Memory: Decreased short-term memory;Decreased recall of precautions Following Commands: Follows one step commands consistently;Follows multi-step commands with increased time Safety/Judgement: Decreased awareness of safety;Decreased awareness of deficits   Problem Solving: Slow processing;Requires verbal cues;Difficulty sequencing General Comments: cues for sequencing with sponge bathing at sink in room, cues for R side awareness      Exercises      General Comments General comments (skin integrity, edema, etc.): VSS with ambulation, Pt soiled with urine in bed (suction tube catheter not set up correctly; assisted pt with sponge bathing seated in chair at sink and provided new gown and deodorant; set up for brushing teeth seated in recliner end of session. pt toileted in bathroom min to mod A up from low toilet wtih grabbar      Pertinent Vitals/Pain Pain Assessment: No/denies pain    Home Living  Prior Function            PT Goals (current goals can now be found in the care plan section) Progress towards PT goals: Progressing toward goals    Frequency    Min 4X/week      PT Plan Current plan remains appropriate    Co-evaluation              AM-PAC PT "6 Clicks"  Mobility   Outcome Measure  Help needed turning from your back to your side while in a flat bed without using bedrails?: A Little Help needed moving from lying on your back to sitting on the side of a flat bed without using bedrails?: A Little Help needed moving to and from a bed to a chair (including a wheelchair)?: A Little Help needed standing up from a chair using your arms (e.g., wheelchair or bedside chair)?: A Little Help needed to walk in hospital room?: A Lot Help needed climbing 3-5 steps with a railing? : Total 6 Click Score: 15    End of Session Equipment Utilized During Treatment: Gait belt Activity Tolerance: Patient tolerated treatment well Patient left: in chair;with call bell/phone within reach;with chair alarm set Nurse Communication: Other (comment)(needs better set up for catheter) PT Visit Diagnosis: Muscle weakness (generalized) (M62.81);Other abnormalities of gait and mobility (R26.89);Hemiplegia and hemiparesis Hemiplegia - Right/Left: Right Hemiplegia - dominant/non-dominant: Dominant     Time: [id number] PT Time Calculation (min) (ACUTE ONLY): 48 min  Charges:  $Gait Training: 8-22 mins $Therapeutic Activity: 23-37 mins                     Magda Kiel, [place] [facility] [phone] 05/19/2019    Reginia Naas 05/19/2019, 2:35 PM

## 2019-05-20 ENCOUNTER — Inpatient Hospital Stay (HOSPITAL_COMMUNITY)
Admission: RE | Admit: 2019-05-20 | Discharge: 2019-06-03 | DRG: 057 | Disposition: A | Discharge status: Home Health | Payer: No Typology Code available for payment source | Source: Intra-hospital | Attending: Physical Medicine & Rehabilitation | Admitting: Physical Medicine & Rehabilitation

## 2019-05-20 ENCOUNTER — Other Ambulatory Visit: Payer: Self-pay

## 2019-05-20 ENCOUNTER — Encounter (HOSPITAL_COMMUNITY): Payer: Self-pay | Admitting: Physical Medicine & Rehabilitation

## 2019-05-20 DIAGNOSIS — R0781 Pleurodynia: Secondary | ICD-10-CM

## 2019-05-20 DIAGNOSIS — I69322 Dysarthria following cerebral infarction: Secondary | ICD-10-CM

## 2019-05-20 DIAGNOSIS — E119 Type 2 diabetes mellitus without complications: Secondary | ICD-10-CM | POA: Diagnosis present

## 2019-05-20 DIAGNOSIS — F1290 Cannabis use, unspecified, uncomplicated: Secondary | ICD-10-CM | POA: Diagnosis present

## 2019-05-20 DIAGNOSIS — Z87891 Personal history of nicotine dependence: Secondary | ICD-10-CM

## 2019-05-20 DIAGNOSIS — R001 Bradycardia, unspecified: Secondary | ICD-10-CM | POA: Diagnosis not present

## 2019-05-20 DIAGNOSIS — Z716 Tobacco abuse counseling: Secondary | ICD-10-CM

## 2019-05-20 DIAGNOSIS — W1830XD Fall on same level, unspecified, subsequent encounter: Secondary | ICD-10-CM | POA: Diagnosis not present

## 2019-05-20 DIAGNOSIS — I69392 Facial weakness following cerebral infarction: Secondary | ICD-10-CM | POA: Diagnosis not present

## 2019-05-20 DIAGNOSIS — I6522 Occlusion and stenosis of left carotid artery: Secondary | ICD-10-CM | POA: Diagnosis present

## 2019-05-20 DIAGNOSIS — I69351 Hemiplegia and hemiparesis following cerebral infarction affecting right dominant side: Principal | ICD-10-CM

## 2019-05-20 DIAGNOSIS — I69320 Aphasia following cerebral infarction: Secondary | ICD-10-CM

## 2019-05-20 DIAGNOSIS — E785 Hyperlipidemia, unspecified: Secondary | ICD-10-CM | POA: Diagnosis present

## 2019-05-20 DIAGNOSIS — Z8551 Personal history of malignant neoplasm of bladder: Secondary | ICD-10-CM

## 2019-05-20 DIAGNOSIS — S20212D Contusion of left front wall of thorax, subsequent encounter: Secondary | ICD-10-CM

## 2019-05-20 DIAGNOSIS — Z23 Encounter for immunization: Secondary | ICD-10-CM | POA: Diagnosis not present

## 2019-05-20 DIAGNOSIS — G8191 Hemiplegia, unspecified affecting right dominant side: Secondary | ICD-10-CM

## 2019-05-20 DIAGNOSIS — Z833 Family history of diabetes mellitus: Secondary | ICD-10-CM | POA: Diagnosis not present

## 2019-05-20 DIAGNOSIS — I639 Cerebral infarction, unspecified: Secondary | ICD-10-CM

## 2019-05-20 DIAGNOSIS — E041 Nontoxic single thyroid nodule: Secondary | ICD-10-CM | POA: Diagnosis present

## 2019-05-20 DIAGNOSIS — Z7151 Drug abuse counseling and surveillance of drug abuser: Secondary | ICD-10-CM | POA: Diagnosis not present

## 2019-05-20 DIAGNOSIS — I63512 Cerebral infarction due to unspecified occlusion or stenosis of left middle cerebral artery: Secondary | ICD-10-CM | POA: Diagnosis present

## 2019-05-20 DIAGNOSIS — K5900 Constipation, unspecified: Secondary | ICD-10-CM | POA: Diagnosis present

## 2019-05-20 LAB — CBC
HCT: 42.9 % (ref 39.0–52.0)
Hemoglobin: 13.4 g/dL (ref 13.0–17.0)
MCH: 27.1 pg (ref 26.0–34.0)
MCHC: 31.2 g/dL (ref 30.0–36.0)
MCV: 86.7 fL (ref 80.0–100.0)
Platelets: 237 10*3/uL (ref 150–400)
RBC: 4.95 MIL/uL (ref 4.22–5.81)
RDW: 14.3 % (ref 11.5–15.5)
WBC: 10.5 10*3/uL (ref 4.0–10.5)
nRBC: 0 % (ref 0.0–0.2)

## 2019-05-20 LAB — CREATININE, SERUM
Creatinine, Ser: 0.91 mg/dL (ref 0.61–1.24)
GFR calc Af Amer: >60 mL/min (ref >60)
GFR calc non Af Amer: >60 mL/min (ref >60)

## 2019-05-20 LAB — GLUCOSE, CAPILLARY
Glucose-Capillary: 96 mg/dL (ref 70–99)
Glucose-Capillary: 95 mg/dL (ref 70–99)
Glucose-Capillary: 88 mg/dL (ref 70–99)
Glucose-Capillary: 106 mg/dL — ABNORMAL HIGH (ref 70–99)

## 2019-05-20 MED ORDER — ASPIRIN EC 325 MG PO TBEC
325.0000 mg | DELAYED_RELEASE_TABLET | Freq: Every day | ORAL | Status: DC
  Administered 2019-05-21 – 2019-06-03 (×14): 325 mg via ORAL
  Filled 2019-05-20 (×14): qty 1

## 2019-05-20 MED ORDER — INSULIN ASPART 100 UNIT/ML ~~LOC~~ SOLN
0.0000 [IU] | Freq: Three times a day (TID) | SUBCUTANEOUS | Status: DC
  Administered 2019-05-23 – 2019-05-31 (×8): 1 [IU] via SUBCUTANEOUS

## 2019-05-20 MED ORDER — ENOXAPARIN SODIUM 40 MG/0.4ML ~~LOC~~ SOLN
40.0000 mg | SUBCUTANEOUS | Status: DC
  Administered 2019-05-21 – 2019-06-03 (×14): 40 mg via SUBCUTANEOUS
  Filled 2019-05-20 (×14): qty 0.4

## 2019-05-20 MED ORDER — SORBITOL 70 % SOLN: 30.0000 mL | Freq: Every day | Status: DC | PRN

## 2019-05-20 MED ORDER — CLOPIDOGREL BISULFATE 75 MG PO TABS
75.0000 mg | ORAL_TABLET | Freq: Every day | ORAL | Status: DC
  Administered 2019-05-21 – 2019-06-03 (×14): 75 mg via ORAL
  Filled 2019-05-20 (×14): qty 1

## 2019-05-20 MED ORDER — ACETAMINOPHEN 325 MG PO TABS
650.0000 mg | ORAL_TABLET | ORAL | Status: DC | PRN
  Administered 2019-05-27: 09:00:00 650 mg via ORAL
  Filled 2019-05-20 (×2): qty 2

## 2019-05-20 MED ORDER — ACETAMINOPHEN 160 MG/5ML PO SOLN: 650.0000 mg | ORAL | Status: DC | PRN

## 2019-05-20 MED ORDER — ACETAMINOPHEN 650 MG RE SUPP: 650.0000 mg | RECTAL | Status: DC | PRN

## 2019-05-20 MED ORDER — ENSURE ENLIVE PO LIQD
237.0000 mL | Freq: Two times a day (BID) | ORAL | Status: DC
  Administered 2019-05-21 – 2019-05-27 (×14): 237 mL via ORAL

## 2019-05-20 MED ORDER — THIAMINE HCL 100 MG PO TABS
100.0000 mg | ORAL_TABLET | Freq: Every day | ORAL | Status: DC
  Administered 2019-05-21 – 2019-06-03 (×14): 100 mg via ORAL
  Filled 2019-05-20 (×14): qty 1

## 2019-05-20 MED ORDER — ENOXAPARIN SODIUM 40 MG/0.4ML ~~LOC~~ SOLN: 40.0000 mg | Freq: Every day | SUBCUTANEOUS | Status: DC

## 2019-05-20 MED ORDER — SENNOSIDES-DOCUSATE SODIUM 8.6-50 MG PO TABS
1.0000 | ORAL_TABLET | Freq: Every evening | ORAL | Status: DC | PRN
  Filled 2019-05-20: qty 1

## 2019-05-20 MED ORDER — ATORVASTATIN CALCIUM 40 MG PO TABS
40.0000 mg | ORAL_TABLET | Freq: Every day | ORAL | Status: DC
  Administered 2019-05-20 – 2019-06-02 (×14): 40 mg via ORAL
  Filled 2019-05-20 (×14): qty 1

## 2019-05-20 NOTE — TOC Transition Note (Signed)
Transition of Care [facility]) - CM/SW Discharge Note   Patient Details  Name: Rilyn Delker MRN: [id number] Date of Birth: Jan 06, 1946  Transition of Care [facility]) CM/SW Contact:  Pollie Friar, RN Phone Number: 05/20/2019, 9:37 AM   Clinical Narrative:    Pt is discharging to CIR today. CM signing off.   Final next level of care: IP Rehab Facility Barriers to Discharge: No Barriers Identified   Patient Goals and CMS Choice   CMS Medicare.gov Compare Post Acute Care list provided to:: Patient Choice offered to / list presented to : Patient  Discharge Placement                       Discharge Plan and Services   Discharge Planning Services: CM Consult Post Acute Care Choice: IP Rehab                               Social Determinants of Health (SDOH) Interventions     Readmission Risk Interventions No flowsheet data found.

## 2019-05-20 NOTE — Progress Notes (Signed)
Inpatient Rehab Admissions Coordinator:   I have a bed available and approval from Dr. Cruzita Lederer for pt to admit to CIR today. I will let pt/family and CM know.   Shann Medal, PT, DPT Admissions Coordinator [phone] 05/20/19  9:25 AM

## 2019-05-20 NOTE — Progress Notes (Signed)
  Speech Language Pathology Treatment: Cognitive-Linquistic  Patient Details Name: Ruben Moore MRN: [id number] DOB: [birth year] Today's Date: 05/20/2019 Time: 1137-1201 SLP Time Calculation (min) (ACUTE ONLY): 24 min  Assessment / Plan / Recommendation Clinical Impression  Patient seen in room for skilled ST targeting dysarthria and aphasia. Patient presents with mild dysarthria, expressive>receptive aphasia. Patient is approximately 90% intelligible at the word level and 80% at the sentence level. Speech notable for articulatory imprecision. He was provided education re: strategies to target dysarthria including slowing rate, over-articulating and pacing. He demonstrated the ability to use all strategies with minimal verbal cueing.  Patient with mild/mod expressive aphasia and minimal receptive aphasia. In conversation, his speech is somewhat halting and effortful, notable for semantic paraphasias and verbal perseveration. In generative naming task, patient able to name 6 items in one minute, repeating several items. In confrontation naming task, patient accurately named 11/15 items. Errors primarily semantic paraphasias (called a camel a "giraffe," called purple "gray," and an octopus "an amphibian.") Pt able to self-correct in 50% of instances. Patient able to answer 3-step commands with 100% accuracy and answer complex yes/no questions with 100% accuracy. ST provided education to patient re: dysarthria and aphasia, he verbalized understanding. Patient will benefit from continued ST services at the inpatient rehab level.   HPI HPI: Ruben Moore is a 74 y.o. right-handed male with history of bladder cancer, diabetes mellitus, tobacco abuse.  Per chart review patient lives alone independent prior to admission.  He is retired.  He does have a daughter in the area that works.  Presented 05/17/2019 with right-sided weakness and dysarthria.  Noted systolic blood pressure in the 150s.  SARS coronavirus  negative, potassium 6.0, troponin negative, urine drug  screen positive marijuana, alcohol level negative.  Cranial CT scan showed infarction within the left insular cortex left basal ganglia corona radiata likely acute to subacute.  No midline shift.  Patient did not receive TPA.  MRI of the brain patchy multifocal acute to early subacute ischemia left MCA territory.  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  Echocardiogram pending.  CT angiogram of head and neck pending.  Neurology consulted presently on aspirin 325 mg daily for CVA prophylaxis and Lovenox for DVT prophylaxis.      SLP Plan  Discharge SLP treatment due to (comment)(pt going to CIR)       Recommendations   CIR                Follow up Recommendations: Inpatient Rehab SLP Visit Diagnosis: Aphasia (R47.01);Dysarthria and anarthria (R47.1) Plan: Discharge SLP treatment due to (comment)(pt going to CIR)       [facility], M.Ed., [facility] [phone]: Acute Rehab office [phone] - pager    Ruben Moore 05/20/2019, 12:04 PM

## 2019-05-20 NOTE — H&P (Signed)
Physical Medicine and Rehabilitation Admission H&P     HPI: Ruben Moore is a 74 year old right-handed male with history of bladder cancer, chronic left carotid occlusion followed at the [facility], diet controlled diabetes mellitus and tobacco abuse.  History taken from chart review and patient due to expressive aphasia.  Patient on no prescription medications.  Patient lives alone independent prior to admission.  He is retired.  He does have a daughter in the area that works.  He presented on 05/17/2019 with right hemiparesis and dysarthria.  Noted systolic blood pressure in the 150s.  SARS coronavirus negative, potassium 6.0, troponin negative, urine drug screen positive marijuana, alcohol level negative.  Cranial CT scan showed left insular cortex, left basal ganglia, corona radiata infarct  likely acute to subacute.  No midline shift.  Patient did not receive TPA.  MRI of the brain patchy multifocal acute to early subacute ischemia left MCA territory.  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  CTA of head and neck left ICA occlusion at its origin.  No significant intracranial stenosis.  Incidental finding of a 3.3 cm right thyroid nodule recommend thyroid ultrasound as outpatient.  Echocardiogram with EF of 65% without emboli. Neurology consulted maintain on aspirin 325 mg daily and Plavix 75 mg daily for 3 months then aspirin alone.  Subcutaneous Lovenox for DVT prophylaxis.  Tolerating regular diet.  Therapy evaluations completed and patient was admitted for a comprehensive rehab program.  Please see preadmission assessment from earlier today as well.  Review of Systems  Constitutional: Negative for chills and fever.  HENT: Negative for hearing loss.   Eyes: Negative for blurred vision and double vision.  Respiratory: Negative for cough and shortness of breath.   Cardiovascular: Negative for palpitations and leg swelling.  Gastrointestinal: Positive for  constipation. Negative for heartburn, nausea and vomiting.  Genitourinary: Positive for urgency. Negative for dysuria, flank pain and hematuria.  Musculoskeletal: Positive for joint pain and myalgias.  Skin: Negative for rash.  Neurological: Positive for sensory change, speech change, focal weakness and weakness.   Past Medical History:  Diagnosis Date  . Bladder cancer ([place])   . Diabetes mellitus without complication ([place])   . Stroke ([place]) 05/16/2019  . Tobacco use    History reviewed. No pertinent surgical history. Family History  Problem Relation Age of Onset  . Diabetes Brother    Social History:  reports that he has quit smoking. He has quit using smokeless tobacco. He reports previous alcohol use. He reports current drug use. Drug: Marijuana. Allergies: No Known Allergies No medications prior to admission.    Drug Regimen Review Drug regimen was reviewed and remains appropriate with no significant issues identified  Home: Home Living Family/patient expects to be discharged to:: Private residence Living Arrangements: Alone Available Help at Discharge: Family Type of Home: Apartment Home Access: Level entry Home Layout: One level Bathroom Shower/Tub: Chiropodist: Standard Home Equipment: None Additional Comments: daughter, Solmon Ice, called during session, reports 24/7 can be arranged   Functional History: Prior Function Level of Independence: Independent Comments: driving, independnet IADLs   Functional Status:  Mobility: Bed Mobility Overal bed mobility: Needs Assistance Bed Mobility: Supine to Sit Supine to sit: Supervision, HOB elevated General bed mobility comments: up in chair Transfers Overall transfer level: Needs assistance Equipment used: Rolling walker (2 wheeled) Transfers: Sit to/from Stand Sit to Stand: Min assist General transfer comment: cues for hand placement for lifting, assist  for  rising Ambulation/Gait Ambulation/Gait assistance: Mod assist Gait Distance (Feet): 150 Feet(& 10') Assistive device: Rolling walker (2 wheeled) Gait Pattern/deviations: Step-through pattern, Decreased stance time - right, Decreased weight shift to left, Shuffle, Ataxic General Gait Details: R knee hyperextension in terminal stance on L cues for shorter steps, but pt not aware, facilitation for L lateral weight shift for improved swing on R Gait velocity: dec Gait velocity interpretation: <1.8 ft/sec, indicate of risk for recurrent falls    ADL: ADL Overall ADL's : Needs assistance/impaired Eating/Feeding: NPO Grooming: Moderate assistance, Sitting Upper Body Bathing: Sitting, Moderate assistance Lower Body Bathing: Moderate assistance, Sit to/from stand Upper Body Dressing : Moderate assistance, Sitting Lower Body Dressing: Maximal assistance, +2 for physical assistance, +2 for safety/equipment, Sit to/from stand Lower Body Dressing Details (indicate cue type and reason): requires assist for socks, min assist +2 sit to stand  Toilet Transfer: +2 for physical assistance, +2 for safety/equipment, Moderate assistance, Ambulation Toilet Transfer Details (indicate cue type and reason): simulated to recliner  Functional mobility during ADLs: Moderate assistance, Minimal assistance, +2 for physical assistance, +2 for safety/equipment General ADL Comments: patient limited by R hemiparesis, impaired balance, decreased activity tolerance and R inattention, as well as cognition   Cognition: Cognition Overall Cognitive Status: Impaired/Different from baseline Arousal/Alertness: Awake/alert Orientation Level: Oriented X4 Attention: Sustained Sustained Attention: Appears intact Cognition Arousal/Alertness: Awake/alert Behavior During Therapy: Flat affect Overall Cognitive Status: Impaired/Different from baseline Area of Impairment: Attention, Safety/judgement, Problem solving, Following  commands, Memory Orientation Level: Disoriented to, Time Current Attention Level: Sustained Memory: Decreased short-term memory, Decreased recall of precautions Following Commands: Follows one step commands consistently, Follows multi-step commands with increased time Safety/Judgement: Decreased awareness of safety, Decreased awareness of deficits Awareness: Emergent Problem Solving: Slow processing, Requires verbal cues, Difficulty sequencing General Comments: cues for sequencing with sponge bathing at sink in room, cues for R side awareness  Physical Exam: Blood pressure 111/65, pulse 61, temperature 98.5 F (36.9 C), resp. rate 16, height 6\' 3"  (1.905 m), weight 73.5 kg, SpO2 97 %. Physical Exam  Vitals reviewed. Constitutional: He is oriented to person, place, and time. He appears well-developed and well-nourished.  HENT:  Head: Normocephalic and atraumatic.  Eyes: EOM are normal. Right eye exhibits no discharge. Left eye exhibits no discharge.  Neck: No tracheal deviation present. No thyromegaly present.  Respiratory: Effort normal. No stridor. No respiratory distress.  GI: Soft. He exhibits no distension.  Musculoskeletal:     Comments: No edema or tenderness in extremities  Neurological: He is alert and oriented to person, place, and time.  Patient is alert in no acute distress.   Follows commands and makes good eye contact with examiner.   Dysarthria Fair awareness of his deficits. Right facial weakness Motor: Right upper extremity: 3+/5 proximal distal Right lower extremity: 4-4+/5 proximal distal Left upper extremity/left lower extremity: 5/5 proximal distal Expressive aphasia  Skin: Skin is warm and dry.  Psychiatric: He has a normal mood and affect. His behavior is normal.    Results for orders placed or performed during the hospital encounter of 05/16/19 (from the past 48 hour(s))  Glucose, capillary     Status: Abnormal   Collection Time: 05/18/19 12:56 PM   Result Value Ref Range   Glucose-Capillary 114 (H) 70 - 99 mg/dL  Glucose, capillary     Status: None   Collection Time: 05/18/19  4:54 PM  Result Value Ref Range   Glucose-Capillary 90 70 - 99 mg/dL  Glucose, capillary  Status: None   Collection Time: 05/18/19  9:01 PM  Result Value Ref Range   Glucose-Capillary 99 70 - 99 mg/dL  CBC     Status: Abnormal   Collection Time: 05/19/19  2:32 AM  Result Value Ref Range   WBC 11.0 (H) 4.0 - 10.5 K/uL   RBC 4.75 4.22 - 5.81 MIL/uL   Hemoglobin 13.0 13.0 - 17.0 g/dL   HCT 40.6 39.0 - 52.0 %   MCV 85.5 80.0 - 100.0 fL   MCH 27.4 26.0 - 34.0 pg   MCHC 32.0 30.0 - 36.0 g/dL   RDW 14.1 11.5 - 15.5 %   Platelets 224 150 - 400 K/uL   nRBC 0.0 0.0 - 0.2 %    Comment: Performed at [facility], [place] [address] [place], [place] [id number]  Basic metabolic panel     Status: Abnormal   Collection Time: 05/19/19  2:32 AM  Result Value Ref Range   Sodium 136 135 - 145 mmol/L   Potassium 3.5 3.5 - 5.1 mmol/L   Chloride 103 98 - 111 mmol/L   CO2 25 22 - 32 mmol/L   Glucose, Bld 103 (H) 70 - 99 mg/dL   BUN 17 8 - 23 mg/dL   Creatinine, Ser 1.20 0.61 - 1.24 mg/dL   Calcium 8.6 (L) 8.9 - 10.3 mg/dL   GFR calc non Af Amer 60 (L) >60 mL/min   GFR calc Af Amer >60 >60 mL/min   Anion gap 8 5 - 15    Comment: Performed at [facility] [address], [place], [place] [address]  Glucose, capillary     Status: None   Collection Time: 05/19/19  7:32 AM  Result Value Ref Range   Glucose-Capillary 89 70 - 99 mg/dL  Glucose, capillary     Status: Abnormal   Collection Time: 05/19/19 12:23 PM  Result Value Ref Range   Glucose-Capillary 105 (H) 70 - 99 mg/dL  Glucose, capillary     Status: Abnormal   Collection Time: 05/19/19  4:18 PM  Result Value Ref Range   Glucose-Capillary 108 (H) 70 - 99 mg/dL  Glucose, capillary     Status: Abnormal   Collection Time: 05/19/19  8:38 PM  Result Value Ref Range   Glucose-Capillary 100 (H) 70 -  99 mg/dL  Glucose, capillary     Status: None   Collection Time: 05/20/19  8:29 AM  Result Value Ref Range   Glucose-Capillary 96 70 - 99 mg/dL   No results found.     Medical Problem List and Plan: 1.  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  -patient may shower  -ELOS/Goals: 10-15 days/Supervision  Admit to CIR 2.  Antithrombotics: -DVT/anticoagulation: Lovenox  -antiplatelet therapy: Aspirin 325 mg daily and Plavix 75 mg daily x3 months and aspirin alone 3. Pain Management: Tylenol as needed 4. Mood: Provide emotional support  -antipsychotic agents: N/A 5. Neuropsych: This patient is ?fully capable of making decisions on his own behalf. 6. Skin/Wound Care: Routine skin checks 7. Fluids/Electrolytes/Nutrition: Routine in and outs. CMP ordered. 8.  History of tobacco abuse as well as marijuana.  Provide counseling 9.  Hyperlipidemia.  Cont Lipitor.  10.  Diabetes mellitus.  Hemoglobin A1c 5.4.  SSI.  Patient on no medications prior to admission  Monitor with increased mobility.  11.  3.3 cm right thyroid nodule.  Follow-up  as outpatient with ultrasound  Cathlyn Parsons, PA-C 05/20/2019  I have personally performed a face to face diagnostic evaluation, including, but not limited to relevant history and physical exam findings, of this patient and developed relevant assessment and plan.  Additionally, I have reviewed and concur with the physician assistant's documentation above.  Delice Lesch, MD, ABPMR  The patient's status has not changed. The original post admission physician evaluation remains appropriate, and any changes from the pre-admission screening or documentation from the acute chart are noted above.   Delice Lesch, MD, ABPMR

## 2019-05-20 NOTE — Progress Notes (Signed)
PMR Admission Coordinator Pre-Admission Assessment   Patient: Ruben Moore is an 74 y.o., male MRN: [id number] DOB: 1946-04-10 Height: _0  (190.5 cm) Weight: 73.5 kg   Insurance Information HMO:     PPO:      PCP:      IPA:      80/20:      OTHER:  PRIMARY: Medicare Part A      Policy#: [id number]      Subscriber: patient CM Name:       Phone#:      Fax#:  Pre-Cert#: verified eligibility online      Employer:  Benefits:  Phone #:      Name:  Eff. Date: 01/20/12     Deduct: $1484      Out of Pocket Max: n/a      Life Max: n/a CIR: 100%      SNF: 20 full days Outpatient: no coverage     Co-Pay:  Home Health: 100%      Co-Pay:  DME: no coverage     Co-Pay:  Providers: pt choice SECONDARY: [facility] Policy#: [id number]      Subscriber: pt CM Name:       Phone#:      Fax#:  Pre-Cert#:       Employer:  Benefits:  Phone #:      Name:  Eff. Date:      Deduct:       Out of Pocket Max:       Life Max:  CIR:       SNF:  Outpatient:      Co-Pay:  Home Health:       Co-Pay:  DME:      Co-Pay:    Medicaid Application Date:       Case Manager:  Disability Application Date:       Case Worker:    The "Data Collection Information Summary" for patients in Inpatient Rehabilitation Facilities with attached "Privacy Act [place] Records" was provided and verbally reviewed with: Patient and Family   Emergency Contact Information         Contact Information     Name Relation Home Work Mobile    Ruben Moore Daughter [phone]   [phone]         Current Medical History  Patient Admitting Diagnosis: L basal ganglia CVA   History of Present Illness:  Ruben Moore is a 74 y.o. right-handed male with history of bladder cancer, diabetes mellitus, tobacco abuse.  Per chart review patient lives alone independent prior to admission.  He is retired.  He does have a daughter in the area that works.  Presented 05/17/2019 with right-sided weakness and dysarthria.  Noted systolic blood  pressure in the 150s.  SARS coronavirus negative, potassium 6.0, troponin negative, urine drug  screen positive marijuana, alcohol level negative.  Cranial CT scan showed infarction within the left insular cortex left basal ganglia corona radiata likely acute to subacute.  No midline shift.  Patient did not receive TPA.  MRI of the brain patchy multifocal acute to early subacute ischemia left MCA territory.  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  Echocardiogram pending.  CT angiogram of head and neck pending.  Neurology consulted presently on aspirin 325 mg daily for CVA prophylaxis and Lovenox for DVT prophylaxis.  Therapy evaluations were completed and pt was recommended for CIR.    Complete NIHSS TOTAL: 5   Patient's medical record from [facility]  [facility] has been reviewed by the rehabilitation admission coordinator and physician.   Past Medical History      Past Medical History:  Diagnosis Date  . Bladder cancer ([place])    . Diabetes mellitus without complication ([place])    . Stroke ([place]) 05/16/2019  . Tobacco use        Family History   family history includes Diabetes in his brother.   Prior Rehab/Hospitalizations Has the patient had prior rehab or hospitalizations prior to admission? Yes treated for bladder cancer in 2019   Has the patient had major surgery during 100 days prior to admission? No               Current Medications   Current Facility-Administered Medications:  .  0.9 %  sodium chloride infusion, , Intravenous, Continuous, Rosalin Hawking, MD, Last Rate: 50 mL/hr at 05/19/19 1833, New Bag at 05/19/19 1833 .  acetaminophen (TYLENOL) tablet 650 mg, 650 mg, Oral, Q4H PRN **OR** acetaminophen (TYLENOL) 160 MG/5ML solution 650 mg, 650 mg, Per Tube, Q4H PRN **OR** acetaminophen (TYLENOL) suppository 650 mg, 650 mg, Rectal, Q4H PRN, Opyd, Timothy S, MD .  aspirin EC tablet 325 mg, 325 mg, Oral, Daily, Rosalin Hawking, MD, 325 mg at 05/20/19 0837 .   atorvastatin (LIPITOR) tablet 40 mg, 40 mg, Oral, q1800, Rosalin Hawking, MD, 40 mg at 05/19/19 1757 .  clopidogrel (PLAVIX) tablet 75 mg, 75 mg, Oral, Daily, Rosalin Hawking, MD, 75 mg at 05/20/19 0837 .  enoxaparin (LOVENOX) injection 40 mg, 40 mg, Subcutaneous, QHS, Opyd, Ilene Qua, MD, 40 mg at 05/20/19 0838 .  feeding supplement (ENSURE ENLIVE) (ENSURE ENLIVE) liquid 237 mL, 237 mL, Oral, BID BM, Opyd, Ilene Qua, MD, 237 mL at 05/19/19 1448 .  insulin aspart (novoLOG) injection 0-5 Units, 0-5 Units, Subcutaneous, QHS, Opyd, Timothy S, MD .  insulin aspart (novoLOG) injection 0-9 Units, 0-9 Units, Subcutaneous, TID WC, Opyd, Timothy S, MD .  senna-docusate (Senokot-S) tablet 1 tablet, 1 tablet, Oral, QHS PRN, Opyd, Ilene Qua, MD .  thiamine tablet 100 mg, 100 mg, Oral, Daily, Domenic Polite, MD, 100 mg at 05/20/19 8110   Patients Current Diet:     Diet Order                      Diet heart healthy/carb modified Room service appropriate? Yes with Assist; Fluid consistency: Thin  Diet effective now                   Precautions / Restrictions Precautions Precautions: Fall Precaution Comments: R hemiplegia Restrictions Weight Bearing Restrictions: No    Has the patient had 2 or more falls or a fall with injury in the past year? No   Prior Activity Level Community (5-7x/wk): active prior to admission, driving, living alone   Prior Functional Level Self Care: Did the patient need help bathing, dressing, using the toilet or eating? Independent   Indoor Mobility: Did the patient need assistance with walking from room to room (with or without device)? Independent   Stairs: Did the patient need assistance with internal or external stairs (with or without device)? Independent   Functional Cognition: Did the patient need help planning regular tasks such as shopping or remembering to take medications? Independent   Home Assistive Devices / Equipment Home Assistive Devices/Equipment:  Eyeglasses, Dentures (specify type) Home Equipment: None   Prior Device Use: Indicate devices/aids used by the patient prior to current illness, exacerbation or injury? None  of the above   Current Functional Level Cognition   Arousal/Alertness: Awake/alert Overall Cognitive Status: Impaired/Different from baseline Current Attention Level: Sustained Orientation Level: Oriented X4 Following Commands: Follows one step commands consistently, Follows multi-step commands with increased time Safety/Judgement: Decreased awareness of safety, Decreased awareness of deficits General Comments: cues for sequencing with sponge bathing at sink in room, cues for R side awareness Attention: Sustained Sustained Attention: Appears intact    Extremity Assessment (includes Sensation/Coordination)   Upper Extremity Assessment: Defer to OT evaluation RUE Deficits / Details: grossly 3-/5 MMT, limited coordination and impaired sensation, decreased awareness to UE and requires cueing and physical support to use functionally RUE Sensation: decreased light touch, decreased proprioception RUE Coordination: decreased gross motor, decreased fine motor  Lower Extremity Assessment: RLE deficits/detail RLE Deficits / Details: grossly 3-/5 t/o LE RLE Sensation: (impaired by 30%) RLE Coordination: decreased fine motor, decreased gross motor     ADLs   Overall ADL's : Needs assistance/impaired Eating/Feeding: NPO Grooming: Moderate assistance, Sitting Upper Body Bathing: Sitting, Moderate assistance Lower Body Bathing: Moderate assistance, Sit to/from stand Upper Body Dressing : Moderate assistance, Sitting Lower Body Dressing: Maximal assistance, +2 for physical assistance, +2 for safety/equipment, Sit to/from stand Lower Body Dressing Details (indicate cue type and reason): requires assist for socks, min assist +2 sit to stand  Toilet Transfer: +2 for physical assistance, +2 for safety/equipment, Moderate  assistance, Ambulation Toilet Transfer Details (indicate cue type and reason): simulated to recliner  Functional mobility during ADLs: Moderate assistance, Minimal assistance, +2 for physical assistance, +2 for safety/equipment General ADL Comments: patient limited by R hemiparesis, impaired balance, decreased activity tolerance and R inattention, as well as cognition      Mobility   Overal bed mobility: Needs Assistance Bed Mobility: Supine to Sit Supine to sit: Supervision, HOB elevated General bed mobility comments: up in chair     Transfers   Overall transfer level: Needs assistance Equipment used: Rolling walker (2 wheeled) Transfers: Sit to/from Stand Sit to Stand: Min assist General transfer comment: cues for hand placement for lifting, assist for rising     Ambulation / Gait / Stairs / Wheelchair Mobility   Ambulation/Gait Ambulation/Gait assistance: Mod assist Gait Distance (Feet): 150 Feet(& 10') Assistive device: Rolling walker (2 wheeled) Gait Pattern/deviations: Step-through pattern, Decreased stance time - right, Decreased weight shift to left, Shuffle, Ataxic General Gait Details: R knee hyperextension in terminal stance on L cues for shorter steps, but pt not aware, facilitation for L lateral weight shift for improved swing on R Gait velocity: dec Gait velocity interpretation: <1.8 ft/sec, indicate of risk for recurrent falls     Posture / Balance Dynamic Sitting Balance Sitting balance - Comments: able to reach forward to attempt donning socks with supervision  Balance Overall balance assessment: Needs assistance Sitting-balance support: No upper extremity supported Sitting balance-Leahy Scale: Fair Sitting balance - Comments: able to reach forward to attempt donning socks with supervision  Standing balance support: Single extremity supported, Bilateral upper extremity supported Standing balance-Leahy Scale: Poor Standing balance comment: UE support and assist  for balance     Special needs/care consideration BiPAP/CPAP no CPM no Continuous Drip IV no Dialysis no        Days n/a Life Vest no Oxygen no Special Bed no Trach Size no Wound Vac (area) no      Location n/a Skin intact  Location n/a Bowel mgmt: continent Bladder mgmt: continent Diabetic mgmt: yes Behavioral consideration no Chemo/radiation no    Previous Home Environment (from acute therapy documentation) Living Arrangements: Alone Available Help at Discharge: Family Type of Home: Apartment Home Layout: One level Home Access: Level entry Bathroom Shower/Tub: Chiropodist: Standard Home Care Services: No Additional Comments: daughter, Solmon Ice, called during session, reports 24/7 can be arranged   Discharge Living Setting Plans for Discharge Living Setting: Patient's home Type of Home at Discharge: Apartment Discharge Home Layout: One level Discharge Home Access: Stairs to enter Entrance Stairs-Rails: None Entrance Stairs-Number of Steps: 1+1 curb steps? Discharge Bathroom Shower/Tub: Tub/shower unit Discharge Bathroom Toilet: Standard Discharge Bathroom Accessibility: Yes How Accessible: Accessible via walker Does the patient have any problems obtaining your medications?: No   Social/Family/Support Systems Anticipated Caregiver: daughter, Drezden Seitzinger Anticipated Caregiver's Contact Information: [phone] Ability/Limitations of Caregiver: n/a Caregiver Availability: 24/7 Discharge Plan Discussed with Primary Caregiver: Yes Is Caregiver In Agreement with Plan?: Yes Does Caregiver/Family have Issues with Lodging/Transportation while Pt is in Rehab?: No   Goals/Additional Needs Patient/Family Goal for Rehab: PT/OT/SLP supervision to mod I Expected length of stay: 12-14 days Dietary Needs: carb modified/heart healthy Additional Information: Has VA and Medicare part A benefits Pt/Family Agrees to Admission and  willing to participate: Yes Program Orientation Provided & Reviewed with Pt/Caregiver Including Roles  & Responsibilities: Yes   Decrease burden of Care through IP rehab admission: n/a   Possible need for SNF placement upon discharge: Not anticipated.    Patient Condition: I have reviewed medical records from [facility], spoken with CM, and patient and daughter. I met with patient at the bedside and discussed via phone for inpatient rehabilitation assessment.  Patient will benefit from ongoing PT, OT and SLP, can actively participate in 3 hours of therapy a day 5 days of the week, and can make measurable gains during the admission.  Patient will also benefit from the coordinated team approach during an Inpatient Acute Rehabilitation admission.  The patient will receive intensive therapy as well as Rehabilitation physician, nursing, social worker, and care management interventions.  Due to safety, disease management, medication administration, pain management and patient education the patient requires 24 hour a day rehabilitation nursing.  The patient is currently mod assist with mobility and basic ADLs.  Discharge setting and therapy post discharge at home is anticipated.  Patient has agreed to participate in the Acute Inpatient Rehabilitation Program and will admit today.   Preadmission Screen Completed By:  Michel Santee, PT, DPT 05/20/2019 9:23 AM ______________________________________________________________________   Discussed status with Dr. Posey Pronto on 05/20/19  at 9:23 AM  and received approval for admission today.   Admission Coordinator:  Michel Santee, PT, DPT time 9:23 AM Sudie Grumbling 05/20/19     Assessment/Plan: Diagnosis: L basal ganglia CVA   1. Does the need for close, 24 hr/day Medical supervision in concert with the patient's rehab needs make it unreasonable for this patient to be served in a less intensive setting? Yes 6. Co-Morbidities requiring supervision/potential  complications: bladder cancer, DM (Monitor in accordance with exercise and adjust meds as necessary), tobacco abuse (counsel), hypokalemia (continue to monitor and replete as necessary) 2. Due to bladder management, bowel management, disease management and patient education, does the patient require 24 hr/day rehab nursing? Yes 3. Does the patient require coordinated care of a physician, rehab nurse, PT, OT, and SLP to address physical and functional deficits in the context of the above  medical diagnosis(es)? Yes Addressing deficits in the following areas: balance, endurance, locomotion, strength, transferring, bathing, dressing, toileting, swallowing and psychosocial support 4. Can the patient actively participate in an intensive therapy program of at least 3 hrs of therapy 5 days a week? Yes 5. The potential for patient to make measurable gains while on inpatient rehab is excellent 6. Anticipated functional outcomes upon discharge from inpatient rehab: supervision PT, supervision OT, modified independent SLP 7. Estimated rehab length of stay to reach the above functional goals is: 10-15 days. 8. Anticipated discharge destination: Home 10. Overall Rehab/Functional Prognosis: good     MD Signature: Delice Lesch, MD, ABPMR

## 2019-05-20 NOTE — Care Management Important Message (Signed)
Important Message  Patient Details  Name: Ruben Moore MRN: [id number] Date of Birth: [birth year]   Medicare Important Message Given:        [name] 05/20/2019, 3:28 PM

## 2019-05-20 NOTE — Progress Notes (Signed)
Occupational Therapy Treatment Patient Details Name: Ruben Moore MRN: [id number] DOB: 18-Feb-1946 Today's Date: 05/20/2019    History of present illness Pt is a 74 y/o male with PMH of DM presenting to ED with R sided weakness and dysarthria. MRI reveals "large left basal ganglia infarct as well as smaller acute and subacute infarctions in the left MCA territory infarcts including the insular cortex frontal and temporal lobes with mild petechial hemorrhage.    OT comments  Patient progressing towards OT goals.  Continues to be limited by impaired cognition, R inattention and weakness. Completing transfers with min assist using RW, grooming standing at sink with min assist for hand washing but requires increased cueing for initiation, sequencing and functional use of R UE.  Scapular mobilizations and gentle stretch provided to RUE, mild tone noted. CIR remains appropriate.     Follow Up Recommendations  CIR;Supervision/Assistance - 24 hour    Equipment Recommendations  3 in 1 bedside commode    Recommendations for Other Services Rehab consult;Speech consult    Precautions / Restrictions Precautions Precautions: Fall Precaution Comments: R hemiplegia Restrictions Weight Bearing Restrictions: No       Mobility Bed Mobility Overal bed mobility: Needs Assistance Bed Mobility: Supine to Sit     Supine to sit: Supervision;HOB elevated     General bed mobility comments: no assist required, for safety only  Transfers Overall transfer level: Needs assistance Equipment used: Rolling walker (2 wheeled) Transfers: Stand Pivot Transfers;Sit to/from Stand Sit to Stand: Min assist Stand pivot transfers: Min assist       General transfer comment: cues for hand placement for lifting, assist for rising and to steady    Balance Overall balance assessment: Needs assistance Sitting-balance support: No upper extremity supported Sitting balance-Leahy Scale: Fair     Standing  balance support: Bilateral upper extremity supported;No upper extremity supported;During functional activity Standing balance-Leahy Scale: Poor Standing balance comment: able to stand at sink without UE support during grooming given min assist, reliant on BUE support dynamically                            ADL either performed or assessed with clinical judgement   ADL Overall ADL's : Needs assistance/impaired     Grooming: Minimal assistance;Standing;Wash/dry hands Grooming Details (indicate cue type and reason): min assist for balance standing at sink, increased time and cueing to functionally utilize R UE during hand washing tasks; increased time and effort with cueing to sequence tasks                 Toilet Transfer: Minimal assistance;Stand-pivot;RW Toilet Transfer Details (indicate cue type and reason): simulated to recliner          Functional mobility during ADLs: Minimal assistance;Rolling walker;Cueing for safety;Cueing for sequencing General ADL Comments: patinet limited by R weakness, decreased activity tolerance, impaired cognition and R inattention      Vision       Perception     Praxis      Cognition Arousal/Alertness: Awake/alert Behavior During Therapy: Flat affect Overall Cognitive Status: Impaired/Different from baseline Area of Impairment: Attention;Safety/judgement;Problem solving;Following commands;Memory                   Current Attention Level: Sustained Memory: Decreased short-term memory;Decreased recall of precautions Following Commands: Follows one step commands consistently;Follows multi-step commands with increased time Safety/Judgement: Decreased awareness of safety;Decreased awareness of deficits Awareness: Emergent Problem Solving: Slow processing;Requires verbal cues;Difficulty  sequencing;Decreased initiation General Comments: cueing to initate and sequencing, R inattention         Exercises Exercises: Other  exercises Other Exercises Other Exercises: scapular mobilizations and PROM/gentle stretch to R UE; pt report tightness (mild noted)  Other Exercises: R UE exercises x 5 reps: tip to tip, shoulder flexion    Shoulder Instructions       General Comments      Pertinent Vitals/ Pain       Pain Assessment: No/denies pain  Home Living                                          Prior Functioning/Environment              Frequency  Min 2X/week        Progress Toward Goals  OT Goals(current goals can now be found in the care plan section)  Progress towards OT goals: Progressing toward goals  Acute Rehab OT Goals Patient Stated Goal: to get better OT Goal Formulation: With patient  Plan Discharge plan remains appropriate;Frequency remains appropriate    Co-evaluation                 AM-PAC OT "6 Clicks" Daily Activity     Outcome Measure   Help from another person eating meals?: A Little Help from another person taking care of personal grooming?: A Little Help from another person toileting, which includes using toliet, bedpan, or urinal?: A Lot Help from another person bathing (including washing, rinsing, drying)?: A Lot Help from another person to put on and taking off regular upper body clothing?: A Lot Help from another person to put on and taking off regular lower body clothing?: A Lot 6 Click Score: 14    End of Session Equipment Utilized During Treatment: Rolling walker  OT Visit Diagnosis: Other abnormalities of gait and mobility (R26.89);Muscle weakness (generalized) (M62.81);Other symptoms and signs involving the nervous system (R29.898);Other symptoms and signs involving cognitive function;Hemiplegia and hemiparesis Hemiplegia - Right/Left: Right Hemiplegia - dominant/non-dominant: Dominant Hemiplegia - caused by: Cerebral infarction   Activity Tolerance Patient tolerated treatment well   Patient Left in chair;with call bell/phone  within reach;with chair alarm set   Nurse Communication Mobility status        Time: [id number] OT Time Calculation (min): 28 min  Charges: OT General Charges $OT Visit: 1 Visit OT Treatments $Self Care/Home Management : 8-22 mins $Neuromuscular Re-education: 8-22 mins  Jolaine Artist, OT Acute Rehabilitation Services Pager [phone] Office [phone]    Delight Stare 05/20/2019, 12:51 PM

## 2019-05-21 ENCOUNTER — Inpatient Hospital Stay (HOSPITAL_COMMUNITY): Payer: Non-veteran care | Admitting: Physical Therapy

## 2019-05-21 ENCOUNTER — Inpatient Hospital Stay (HOSPITAL_COMMUNITY): Payer: Non-veteran care | Admitting: Speech Pathology

## 2019-05-21 ENCOUNTER — Inpatient Hospital Stay (HOSPITAL_COMMUNITY): Payer: Non-veteran care | Admitting: Occupational Therapy

## 2019-05-21 DIAGNOSIS — I63512 Cerebral infarction due to unspecified occlusion or stenosis of left middle cerebral artery: Secondary | ICD-10-CM

## 2019-05-21 LAB — GLUCOSE, CAPILLARY
Glucose-Capillary: 93 mg/dL (ref 70–99)
Glucose-Capillary: 105 mg/dL — ABNORMAL HIGH (ref 70–99)
Glucose-Capillary: 134 mg/dL — ABNORMAL HIGH (ref 70–99)

## 2019-05-21 MED ORDER — ADULT MULTIVITAMIN W/MINERALS CH
1.0000 | ORAL_TABLET | Freq: Every day | ORAL | Status: DC
  Administered 2019-05-22 – 2019-06-03 (×13): 1 via ORAL
  Filled 2019-05-21 (×13): qty 1

## 2019-05-21 MED ORDER — PNEUMOCOCCAL VAC POLYVALENT 25 MCG/0.5ML IJ INJ
0.5000 mL | INJECTION | INTRAMUSCULAR | Status: AC
  Administered 2019-05-22: 17:00:00 0.5 mL via INTRAMUSCULAR

## 2019-05-21 NOTE — Progress Notes (Signed)
Speech Language Pathology Daily Session Note  Patient Details  Name: Mai Nicolich MRN: [id number] Date of Birth: 1946-02-06  Today's Date: 05/21/2019 SLP Individual Time: [id number] SLP Individual Time Calculation (min): 30 min  Short Term Goals: Week 1: SLP Short Term Goal 1 (Week 1): Pt will utilize word finding strategies to create list of 10 words within specified category with Min A cues. SLP Short Term Goal 2 (Week 1): Pt will utilize word finding strategies to convey basic information with Min A cues. SLP Short Term Goal 3 (Week 1): Pt will self-monitor and self-correct word finding errors in 7 out of 10 opportunities with Mod A cues. SLP Short Term Goal 4 (Week 1): Pt will implement speech intelligibility strategies to increase speech intelligiblity at simple conversation to >90% intelligibility and Min A cues.  Skilled Therapeutic Interventions:   Pt's daughter in room. SLP returned to gather further information regarding pt's current living situation and support available at d/c. Pt unable to clearly express this concepts d/t aphasia. Pt currently lives in a 2-story apartment but resides on the first floor. Pt's daughter is willing to help pt at discharge "if he wants her too." Additional information was provided on nature of aphasia and strategies to help pt express wants/needs as well as ST POC.   Pt very frustrated as he is unable to express names of companies that need to be paid. SLP facilitiated by providing Min A verbal cues and writing down basic caterogies such as rent, phone, power, TV, car payment. Pt able to generate this list with Min A cues and written cues very helpful. With written cues, pt able to generate amount of rent and express that power was included in rent payment.  Pt and daughter very pleased (relieved that pt was able to express more information using written cues.Pt's written response became perseverative on "[employer] [place]" when witting down name of phone  company. Pt aware that this was incorrect but unable to cease perseveration. Will target at next ST session.    Pain    Therapy/Group: Individual Therapy  Satin Boal 05/21/2019, 3:09 PM

## 2019-05-21 NOTE — Evaluation (Signed)
Speech Language Pathology Assessment and Plan  Patient Details  Name: Ruben Moore MRN: [id number] Date of Birth: Jul 07, 1945  SLP Diagnosis: Speech and Language deficits  Rehab Potential: Good ELOS: 10-12 days    Today's Date: 05/21/2019 SLP Individual Time: 2505-3976 SLP Individual Time Calculation (min): 60 min   Problem List:  Patient Active Problem List   Diagnosis Date Noted  . 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 Thyroid nodule   . Right hemiparesis ([place])   . Dyslipidemia   . Acute ischemic stroke ([place]) 05/17/2019  . Hyperkalemia 05/17/2019  . Diabetes mellitus type II, non insulin dependent ([place]) 05/17/2019  . Bladder cancer ([place])   . Tobacco use    Past Medical History:  Past Medical History:  Diagnosis Date  . Bladder cancer ([place])   . Diabetes mellitus without complication ([place])   . Stroke ([place]) 05/16/2019  . Tobacco use    Past Surgical History: History reviewed. No pertinent surgical history.  Assessment / Plan / Recommendation Clinical Impression Ruben Moore is a 74 year old LEFT-handed male with history of bladder cancer, chronic left carotid occlusion, diet controlled diabetes mellitus and tobacco abuse.  Patient on no prescription medications.  Patient lives alone independent prior to admission.  He is retired.  He does have a daughter in the area that works.  He presented on 05/17/2019 with right hemiparesis and dysarthria.  Noted systolic blood pressure in the 150s.  SARS coronavirus negative, potassium 6.0, troponin negative, urine drug screen positive marijuana, alcohol level negative.  Cranial CT scan showed left insular cortex, left basal ganglia, corona radiata infarct  likely acute to subacute. Patient did not receive TPA.  MRI of the brain patchy multifocal acute to early subacute ischemia left MCA territory.  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  CTA of head and neck left ICA occlusion at its  origin.  No significant intracranial stenosis.  Incidental finding of a 3.3 cm right thyroid nodule recommend thyroid ultrasound as outpatient.  Echocardiogram with EF of 65% without emboli. Tolerating regular diet.  Therapy evaluations completed and patient was admitted for a comprehensive rehab program on 05/20/19.   Speech-language evaluation completed on 05/20/18 that revealed expressive aphasia c/b semantic paraphasias and errors of perseveration. As a result, pt frequently answers with yes/no responses and is initially hesitant to verbally interact, however as he become more comfortable he began communicating at sentence level to simple conversation level despite expressive errors. Pt is able to read and write as well as perform confrontational, responsive and divergent naming tasks. Generative naming tasks are most difficult (3 to 5 items per category). Pt also presents with mild right sided facial weakness and decreased speech intelligibility d/t imprecise articulation. This is most apparent during simple conversation. Pt receptive language and cognitive skills appear within functional limits. Pt able to follow directions, sustain attention, problem solve and demonstrate good safety awareness while changing brief and putting on his pants. Skilled ST is required to target the above mentioned deficits as they prevent pt from returning to premorbid abilities.    Skilled Therapeutic Interventions          [phone] - Skilled treatment session targeted generative naming tasks. Pt was given written prompt of category and asked to write down items within category. Initially, pt able to write 2 items and them perseveratively wrote same items again (no awareness). Additionally, after 2 to 3 items he began writing down items outside of category.  SLP facilitated increased generation of items by having pt visualize category within life setting as well as verbally saying the word prior to writing the word down. Pt's  ability increased to 6 to 8 words per category when utilizing this category.    SLP Assessment  Patient will need skilled [facility] during CIR admission    Recommendations  Patient destination: Home Follow up Recommendations: (TBD)    SLP Frequency 3 to 5 out of 7 days   SLP Duration  SLP Intensity  SLP Treatment/Interventions 10-12 days  Minumum of 1-2 x/day, 30 to 90 minutes  Speech/Language facilitation;Therapeutic Activities;Patient/family education;Functional tasks    Pain    Prior Functioning Cognitive/Linguistic Baseline: Within functional limits Type of Home: Apartment  Lives With: Alone Available Help at Discharge: Family;Available 24 hours/day Vocation: Retired  Programmer, systems Overall Cognitive Status: Within Functional Limits for tasks assessed Arousal/Alertness: Awake/alert Orientation Level: Oriented X4 Attention: Selective Selective Attention: Appears intact Memory: Impaired Memory Impairment: Decreased recall of new information Immediate Memory Recall: Sock;Bed Memory Recall Sock: With Cue Memory Recall Blue: Not able to recall Memory Recall Bed: Without Cue Problem Solving: Impaired(pt urinating on floor during session and needing vcs for problem solving and safety) Safety/Judgment: Appears intact  Comprehension Auditory Comprehension Overall Auditory Comprehension: Appears within functional limits for tasks assessed Conversation: Simple Visual Recognition/Discrimination Discrimination: Within Function Limits Reading Comprehension Reading Status: Within funtional limits Expression Expression Primary Mode of Expression: Verbal Verbal Expression Overall Verbal Expression: Impaired Initiation: Impaired Automatic Speech: Name;Social Response Level of Generative/Spontaneous Verbalization: Sentence Repetition: No impairment Level of Impairment: Sentence level Naming: Impairment Confrontation: Within  functional limits Other Naming Comments: generative naming impaired Verbal Errors: Semantic paraphasias;Perseveration Pragmatics: No impairment Effective Techniques: Semantic cues;Written cues Non-Verbal Means of Communication: Not applicable Written Expression Dominant Hand: Left Written Expression: Within Functional Limits Oral Motor Oral Motor/Sensory Function Overall Oral Motor/Sensory Function: Mild impairment Facial ROM: Reduced right Facial Symmetry: Abnormal symmetry right Lingual ROM: Reduced right Motor Speech Overall Motor Speech: Appears within functional limits for tasks assessed Respiration: Within functional limits Phonation: Normal Resonance: Within functional limits Articulation: Impaired Level of Impairment: Conversation Intelligibility: Intelligibility reduced Conversation: (80 to 90% intelligible) Motor Planning: Witnin functional limits Motor Speech Errors: Not applicable Effective Techniques: Over-articulate  Short Term Goals: Week 1: SLP Short Term Goal 1 (Week 1): Pt will utilize word finding strategies to create list of 10 words within specified category with Min A cues. SLP Short Term Goal 2 (Week 1): Pt will utilize word finding strategies to convey basic information with Min A cues. SLP Short Term Goal 3 (Week 1): Pt will self-monitor and self-correct word finding errors in 7 out of 10 opportunities with Mod A cues. SLP Short Term Goal 4 (Week 1): Pt will implement speech intelligibility strategies to increase speech intelligiblity at simple conversation to >90% intelligibility and Min A cues.  Refer to Care Plan for Long Term Goals  Recommendations for other services: None   Discharge Criteria: Patient will be discharged from SLP if patient refuses treatment 3 consecutive times without medical reason, if treatment goals not met, if there is a change in medical status, if patient makes no progress towards goals or if patient is discharged from  hospital.  The above assessment, treatment plan, treatment alternatives and goals were discussed and mutually agreed upon: by patient  Ruben Moore 05/21/2019, 12:41 PM

## 2019-05-21 NOTE — Progress Notes (Signed)
[facility] PHYSICAL MEDICINE & REHABILITATION PROGRESS NOTE   Subjective/Complaints: No complaints this morning. Denis pain or constipation. Sleeping well at night.    Objective:   No results found. Recent Labs    05/19/19 0232 05/20/19 1657  WBC 11.0* 10.5  HGB 13.0 13.4  HCT 40.6 42.9  PLT 224 237   Recent Labs    05/19/19 0232 05/20/19 1657  NA 136  --   K 3.5  --   CL 103  --   CO2 25  --   GLUCOSE 103*  --   BUN 17  --   CREATININE 1.20 0.91  CALCIUM 8.6*  --     Intake/Output Summary (Last 24 hours) at 05/21/2019 1656 Last data filed at 05/21/2019 1445 Gross per 24 hour  Intake 580 ml  Output 1200 ml  Net -620 ml     Physical Exam: Vital Signs Blood pressure 127/63, pulse (!) 56, temperature 97.6 F (36.4 C), resp. rate 18, height 6\' 3"  (1.905 m), weight 73.5 kg, SpO2 100 %. Vitals reviewed. Constitutional: He is oriented to person, place, and time. He appears well-developed and well-nourished.  HENT:  Head: Normocephalic and atraumatic.  Eyes: EOM are normal. Right eye exhibits no discharge. Left eye exhibits no discharge.  Neck: No tracheal deviation present. No thyromegaly present.  Respiratory: Effort normal. No stridor. No respiratory distress.  GI: Soft. He exhibits no distension.  Musculoskeletal:     Comments: No edema or tenderness in extremities  Neurological: He is alert and oriented to person, place, and time.  Patient is alert in no acute distress.   Follows commands and makes good eye contact with examiner.   Dysarthria Fair awareness of his deficits. Right facial weakness Motor: Right upper extremity: 3+/5 proximal distal Right lower extremity: 4-4+/5 proximal distal Left upper extremity/left lower extremity: 5/5 proximal distal Expressive aphasia. Difficulty with naming tasks.  Skin: Skin is warm and dry.  Psychiatric: He has a normal mood and affect. His behavior is normal.     Assessment/Plan: 1. Functional deficits  secondary to left MCA stroke which require 3+ hours per day of interdisciplinary therapy in a comprehensive inpatient rehab setting.  Physiatrist is providing close team supervision and 24 hour management of active medical problems listed below.  Physiatrist and rehab team continue to assess barriers to discharge/monitor patient progress toward functional and medical goals  Care Tool:  Bathing    Body parts bathed by patient: Right arm, Left arm, Chest, Abdomen, Front perineal area, Buttocks, Right upper leg, Left upper leg, Right lower leg, Left lower leg, Face         Bathing assist Assist Level: Minimal Assistance - Patient > 75%     Upper Body Dressing/Undressing Upper body dressing   What is the patient wearing?: Pull over shirt    Upper body assist Assist Level: Supervision/Verbal cueing    Lower Body Dressing/Undressing Lower body dressing      What is the patient wearing?: Incontinence brief, Pants     Lower body assist Assist for lower body dressing: Moderate Assistance - Patient 50 - 74%     Toileting Toileting    Toileting assist Assist for toileting: Moderate Assistance - Patient 50 - 74%     Transfers Chair/bed transfer  Transfers assist           Locomotion Ambulation   Ambulation assist              Walk 10 feet activity  Assist           Walk 50 feet activity   Assist           Walk 150 feet activity   Assist           Walk 10 feet on uneven surface  activity   Assist           Wheelchair     Assist               Wheelchair 50 feet with 2 turns activity    Assist            Wheelchair 150 feet activity     Assist          Blood pressure 127/63, pulse (!) 56, temperature 97.6 F (36.4 C), resp. rate 18, height 6\' 3"  (1.905 m), weight 73.5 kg, SpO2 100 %.  Medical Problem List and Plan: 1.  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             -patient may shower             -ELOS/Goals: 10-15 days/Supervision           1/30:  Initial CIR evals today. Notes reviewed. Ambulated 120 feet with RW today, difficult with naming tasks.  2.  Antithrombotics: -DVT/anticoagulation: Lovenox             -antiplatelet therapy: Aspirin 325 mg daily and Plavix 75 mg daily x3 months and aspirin alone 3. Pain Management: Tylenol as needed  1/30: Pain well controlled.  4. Mood: Provide emotional support             -antipsychotic agents: N/A 5. Neuropsych: This patient is ?fully capable of making decisions on his own behalf. 6. Skin/Wound Care: Routine skin checks 7. Fluids/Electrolytes/Nutrition: Routine in and outs.  8.  History of tobacco abuse as well as marijuana.  Provide counseling 9.  Hyperlipidemia.  Cont Lipitor.  10.  Diabetes mellitus.  Hemoglobin A1c 5.4.  SSI.  Patient on no medications prior to admission             Monitor with increased mobility.  11.  3.3 cm right thyroid nodule.  Follow-up as outpatient with ultrasound 12. Bradycardia: HR decreased to 56 today. Flowsheet reviewed. Has been borderline bradycardic. Continue to monitor daily.     LOS: 1 days A FACE TO FACE EVALUATION WAS PERFORMED  Clide Deutscher Shaya Altamura 05/21/2019, 4:56 PM

## 2019-05-21 NOTE — Progress Notes (Signed)
Initial Nutrition Assessment  DOCUMENTATION CODES:   Not applicable  INTERVENTION:   Ensure Enlive po BID, each supplement provides 350 kcal and 20 grams of protein  Magic cup TID with meals, each supplement provides 290 kcal and 9 grams of protein  MVI daily   NUTRITION DIAGNOSIS:   Inadequate oral intake related to acute illness as evidenced by per patient/family report  GOAL:   Patient will meet greater than or equal to 90% of their needs  MONITOR:   PO intake, Supplement acceptance, Labs, Weight trends, Skin, I & O's  REASON FOR ASSESSMENT:   Malnutrition Screening Tool    ASSESSMENT:   74 y/o male with h/o DM, CAD, substance abuse, bladder cancer admitted with right sided hemiparesis with dysarthria secondary to left MCA patchy infarct with petechial hemorrhage in the setting of 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  RD working remotely.  Pt with fairly good appetite and oral intake in hospital; pt eating 50-75% of meals and is drinking 1 Ensure per day. Pt receiving Magic Cups on meal trays. RD will add daily MVI. No new weight since admit; pt is ordered for weekly weights.   Medications reviewed and include: aspirin, plavix, insulin, thiamine   Labs reviewed:   Unable to complete Nutrition-Focused physical exam at this time.   Diet Order:   Diet Order            Diet heart healthy/carb modified Room service appropriate? Yes with Assist; Fluid consistency: Thin  Diet effective now             EDUCATION NEEDS:   Education needs have been addressed  Skin:  Skin Assessment: Reviewed RN Assessment  Last BM:  1/29  Height:   Ht Readings from Last 1 Encounters:  05/20/19 6\' 3"  (1.905 m)    Weight:   Wt Readings from Last 1 Encounters:  05/20/19 73.5 kg    Ideal Body Weight:  89 kg  BMI:  Body mass index is 20.25 kg/m.  Estimated Nutritional Needs:   Kcal:  2100-2400kcal/day  Protein:  110-125g/day  Fluid:  >1.9L/day  Koleen Distance  MS, RD, LDN Pager #- [phone] Office#- [phone] After Hours Pager: [phone]

## 2019-05-21 NOTE — Evaluation (Signed)
Occupational Therapy Assessment and Plan  Patient Details  Name: Ruben Moore MRN: [id number] Date of Birth: 1945-08-17  OT Diagnosis: abnormal posture, apraxia, ataxia, hemiplegia affecting non-dominant side, muscle weakness (generalized) and coordination disorder Rehab Potential: Rehab Potential (ACUTE ONLY): Excellent ELOS: 10-12 days   Today's Date: 05/21/2019 OT Individual Time: 0900-1011 OT Individual Time Calculation (min): 71 min     Problem List:  Patient Active Problem List   Diagnosis Date Noted  . 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 Thyroid nodule   . Right hemiparesis ([place])   . Dyslipidemia   . Acute ischemic stroke ([place]) 05/17/2019  . Hyperkalemia 05/17/2019  . Diabetes mellitus type II, non insulin dependent ([place]) 05/17/2019  . Bladder cancer ([place])   . Tobacco use     Past Medical History:  Past Medical History:  Diagnosis Date  . Bladder cancer ([place])   . Diabetes mellitus without complication ([place])   . Stroke ([place]) 05/16/2019  . Tobacco use    Past Surgical History: History reviewed. No pertinent surgical history.  Assessment & Plan Clinical Impression:   Ruben Moore is a 74 year old right-handed male with history of bladder cancer, chronic left carotid occlusion followed at the [facility], diet controlled diabetes mellitus and tobacco abuse.  History taken from chart review and patient due to expressive aphasia.  Patient on no prescription medications.  Patient lives alone independent prior to admission.  He is retired.  He does have a daughter in the area that works.  He presented on 05/17/2019 with right hemiparesis and dysarthria.  Noted systolic blood pressure in the 150s.  SARS coronavirus negative, potassium 6.0, troponin negative, urine drug screen positive marijuana, alcohol level negative.  Cranial CT scan showed left insular cortex, left basal ganglia, corona radiata infarct  likely acute to subacute.  No midline shift.   Patient did not receive TPA.  MRI of the brain patchy multifocal acute to early subacute ischemia left MCA territory.  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  CTA of head and neck left ICA occlusion at its origin.  No significant intracranial stenosis.  Incidental finding of a 3.3 cm right thyroid nodule recommend thyroid ultrasound as outpatient.  Echocardiogram with EF of 65% without emboli. Neurology consulted maintain on aspirin 325 mg daily and Plavix 75 mg daily for 3 months then aspirin alone.  Subcutaneous Lovenox for DVT prophylaxis.  Tolerating regular diet.  Therapy evaluations completed and patient was admitted for a comprehensive rehab program.  Please see preadmission assessment from earlier today as well.  Patient currently requires supervision with basic self-care skills secondary to muscle weakness, decreased cardiorespiratoy endurance, unbalanced muscle activation, motor apraxia, ataxia, decreased coordination and decreased motor planning, decreased attention to right, decreased initiation, decreased problem solving and decreased memory and decreased sitting balance, decreased standing balance, decreased postural control, hemiplegia and decreased balance strategies.  Prior to hospitalization, patient could complete BADLs with independent .  Patient will benefit from skilled intervention to increase independence with basic self-care skills prior to discharge home with support.  Anticipate patient will require 24 hour supervision and follow up home health.  OT - End of Session Endurance Deficit: Yes OT Assessment Rehab Potential (ACUTE ONLY): Excellent OT Barriers to Discharge: Medical stability;Incontinence OT Patient demonstrates impairments in the following area(s): Balance;Perception;Cognition;Endurance;Vision;Motor OT Basic ADL's Functional Problem(s): Grooming;Bathing;Dressing;Toileting OT Advanced ADL's Functional Problem(s): Simple Meal  Preparation OT Transfers Functional Problem(s): Toilet;Tub/Shower OT Additional Impairment(s): Fuctional Use  of Upper Extremity OT Plan OT Intensity: Minimum of 1-2 x/day, 45 to 90 minutes OT Frequency: 5 out of 7 days OT Duration/Estimated Length of Stay: 10-12 days OT Treatment/Interventions: Balance/vestibular training;Discharge planning;Pain management;Therapeutic Activities;Self Care/advanced ADL retraining;UE/LE Coordination activities;Visual/perceptual remediation/compensation;Therapeutic Exercise;Patient/family education;Functional mobility training;Disease mangement/prevention;Cognitive remediation/compensation;Community reintegration;DME/adaptive equipment instruction;Neuromuscular re-education;Psychosocial support;UE/LE Strength taining/ROM OT Self Feeding Anticipated Outcome(s): No goal OT Basic Self-Care Anticipated Outcome(s): Supervision/cuing OT Toileting Anticipated Outcome(s): Supervision/cuing OT Bathroom Transfers Anticipated Outcome(s): Supervision/cuing OT Recommendation Recommendations for Other Services: Therapeutic Recreation consult Patient destination: Home Follow Up Recommendations: Home health OT Equipment Recommended: To be determined  Skilled Therapeutic Intervention Skilled OT session completed with focus on initial evaluation, education on OT role/POC, and establishment of patient-centered goals.   Pt greeted in bed with no c/o pain. Agreeable to tx. Supine<sit completed with supervision assist. He next completed bathing/dressing tasks EOB at sit<stand level using RW. Pt needed initial cuing to self organize and initiate as he would pick up and briefly look at ADL items on his table. Pt with ataxic R UE and weak hand grasp but able to wash his Lt arm without dropping wash cloth. Steady assist for sit<stands with forceful and quick power ups. Min A for dynamic standing balance while pt engaged in LB self care tasks with unilateral support on RW. Note he needed Mod  A for balance when he had no UE support on device. Assist needed for meeting FM demands of tasks, such as opening plastic wrap to his gripper socks and fastening his pants due to Rt ataxia. Cuing throughout session for increasing functional use of Rt, though pt is Lt handed at baseline. Pt was incontinent of bladder during perihygiene, soaking clothing and needed vcs and assist for problem solving. Pt then cleaned and dressed LB for 2nd time. Ambulatory toilet transfer completed using RW with Min A and vcs for technique, noted Rt knee hyperextension during transfer. He then ambulated to the recliner and was setup with safety belt and all needs within reach.    OT Evaluation Precautions/Restrictions  Precautions Precautions: Fall Precaution Comments: Rt hemiplegia Home Living/Prior Functioning Home Living Family/patient expects to be discharged to:: Private residence Living Arrangements: Alone Available Help at Discharge: Family, Available 24 hours/day Type of Home: Apartment Home Access: Level entry Home Layout: One level Bathroom Shower/Tub: Optometrist: Yes  Lives With: Alone IADL History Homemaking Responsibilities: Yes(Pt reports independent completion of all IADLs PTA) Occupation: Retired Leisure and Hobbies: Golf Prior Function Level of Independence: Independent with basic ADLs, Independent with homemaking with ambulation Driving: Yes Vocation: Retired ADL ADL Eating: Not assessed Grooming: Moderate assistance Where Assessed-Grooming: Chair Upper Body Bathing: Supervision/safety Where Assessed-Upper Body Bathing: Edge of bed Lower Body Bathing: Minimal assistance Where Assessed-Lower Body Bathing: Edge of bed Upper Body Dressing: Supervision/safety Where Assessed-Upper Body Dressing: Edge of bed Lower Body Dressing: Moderate assistance Where Assessed-Lower Body Dressing: Edge of bed Toileting: Not assessed Toilet  Transfer: Minimal assistance Toilet Transfer Method: Ambulating(RW) Tub/Shower Transfer: Not assessed Vision Baseline Vision/History: Wears glasses Wears Glasses: At all times Patient Visual Report: Blurring of vision(Per pt this has been an ongoing issue PTA) Perception  Perception: Impaired Inattention/Neglect: Does not attend to right side of body Praxis Praxis: Impaired Praxis Impairment Details: Motor planning;Perseveration;Initiation Cognition Overall Cognitive Status: Within Functional Limits for tasks assessed Arousal/Alertness: Awake/alert Orientation Level: Person;Place Year: Other (Comment)(1971) Month: January Day of Week: Correct Memory: Impaired Memory Impairment: Decreased recall of new information Immediate Memory Recall: Sock;Bed Memory  Recall Sock: With Cue Memory Recall Blue: Not able to recall Memory Recall Bed: Without Cue Attention: Selective Selective Attention: Appears intact Problem Solving: Impaired(pt urinating on floor during session and needing vcs for problem solving and safety) Safety/Judgment: Appears intact Sensation Coordination Gross Motor Movements are Fluid and Coordinated: No Fine Motor Movements are Fluid and Coordinated: No(Pt needing A to fasten pants and open packaging of ADL items) Coordination and Movement Description: Ataxic with impaired postural control and Rt hemiparesis Finger Nose Finger Test: WNL Lt, ataxic Rt Motor  Motor Motor: Hemiplegia;Ataxia;Motor apraxia;Abnormal postural alignment and control Mobility    Min A ambulatory toilet transfer using RW  Trunk/Postural Assessment  Cervical Assessment Cervical Assessment: Exceptions to WFL(forward head) Thoracic Assessment Thoracic Assessment: Exceptions to WFL(rounded shoulders) Lumbar Assessment Lumbar Assessment: Exceptions to WFL(posterior pelvic tilt) Postural Control Postural Control: Deficits on evaluation(Pt losing his balance backwards during dynamic sitting  tasks EOB, able to recover without assistance)  Balance Balance Balance Assessed: Yes Dynamic Sitting Balance Dynamic Sitting - Balance Support: During functional activity;No upper extremity supported Dynamic Sitting - Level of Assistance: 5: Stand by assistance(donning gripper socks EOB) Dynamic Standing Balance Dynamic Standing - Balance Support: No upper extremity supported;Right upper extremity supported;During functional activity Dynamic Standing - Level of Assistance: 4: Min assist;3: Mod assist Dynamic Standing - Balance Activities: Lateral lean/weight shifting;Forward lean/weight shifting;Reaching for objects(perihygiene completion, standing with RW) Extremity/Trunk Assessment RUE Assessment RUE Assessment: (Ataxic + apraxic) Active Range of Motion (AROM) Comments: ~130 degrees shoulder flexion, ~150 degrees shoulder abduction, WNL other ranges General Strength Comments: 14# hand strength via dynamometer testing (average of 3 trials) LUE Assessment Active Range of Motion (AROM) Comments: WNL General Strength Comments: 68# hand strength via dynamometer testing (average of 3 trials)    Refer to Care Plan for Long Term Goals  Recommendations for other services: Therapeutic Recreation  Other leisure pursuits   Discharge Criteria: Patient will be discharged from OT if patient refuses treatment 3 consecutive times without medical reason, if treatment goals not met, if there is a change in medical status, if patient makes no progress towards goals or if patient is discharged from hospital.  The above assessment, treatment plan, treatment alternatives and goals were discussed and mutually agreed upon: by patient  Skeet Simmer 05/21/2019, 12:27 PM

## 2019-05-21 NOTE — Progress Notes (Signed)
Patient noted laying in bed, alert with delayed responses. He also has problems with word finding, but nods appropriately. Attempted to give him a laxative due to last BM being noted on 05/18/19, but he refused. Discussed it with the patient & it seems that this is his normal pattern. He was educated on risks. He was found to be incontinent of urine at time of physical assessment & was changed. He did assist with rolling from side to side, but needed cueing & sometimes tactile stimulation to get him to follow commands. No acute distress noted. Will continue to monitor during the shift for changes & needs.

## 2019-05-21 NOTE — Evaluation (Signed)
Physical Therapy Assessment and Plan  Patient Details  Name: Ruben Moore MRN: [id number] Date of Birth: 02-28-1946  PT Diagnosis: Abnormal posture, Abnormality of gait, Ataxia, Ataxic gait, Coordination disorder, Hemiparesis dominant and Muscle spasms Rehab Potential: Good ELOS: 10-14 days   Today's Date: 05/21/2019 PT Individual Time: 1300-1400 PT Individual Time Calculation (min): 60 min    Problem List:  Patient Active Problem List   Diagnosis Date Noted  . 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 Thyroid nodule   . Right hemiparesis ([place])   . Dyslipidemia   . Acute ischemic stroke ([place]) 05/17/2019  . Hyperkalemia 05/17/2019  . Diabetes mellitus type II, non insulin dependent ([place]) 05/17/2019  . Bladder cancer ([place])   . Tobacco use     Past Medical History:  Past Medical History:  Diagnosis Date  . Bladder cancer ([place])   . Diabetes mellitus without complication ([place])   . Stroke ([place]) 05/16/2019  . Tobacco use    Past Surgical History: History reviewed. No pertinent surgical history.  Assessment & Plan Clinical Impression: Patient is a  74 year old right-handed male with history of bladder cancer, chronic left carotid occlusion followed at the [facility], diet controlled diabetes mellitus and tobacco abuse.  History taken from chart review and patient due to expressive aphasia.  Patient on no prescription medications.  Patient lives alone independent prior to admission.  He is retired.  He does have a daughter in the area that works.  He presented on 05/17/2019 with right hemiparesis and dysarthria.  Noted systolic blood pressure in the 150s.  SARS coronavirus negative, potassium 6.0, troponin negative, urine drug screen positive marijuana, alcohol level negative.  Cranial CT scan showed left insular cortex, left basal ganglia, corona radiata infarct  likely acute to subacute.  No midline shift.  Patient did not receive TPA.  MRI of the brain patchy multifocal  acute to early subacute ischemia left MCA territory.  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  CTA of head and neck left ICA occlusion at its origin.  No significant intracranial stenosis.  Incidental finding of a 3.3 cm right thyroid nodule recommend thyroid ultrasound as outpatient.  Echocardiogram with EF of 65% without emboli.  Patient transferred to CIR on 05/20/2019 .   Patient currently requires mod with mobility secondary to muscle weakness and muscle joint tightness, decreased cardiorespiratoy endurance, unbalanced muscle activation, motor apraxia, ataxia, decreased coordination and decreased motor planning, decreased visual acuity and decreased visual motor skills, decreased attention to right, decreased awareness, decreased problem solving and delayed processing and decreased sitting balance, decreased standing balance, decreased postural control, hemiplegia and decreased balance strategies.  Prior to hospitalization, patient was independent  with mobility and lived with Alone in a [place] home.  Home access is  Level entry.  Patient will benefit from skilled PT intervention to maximize safe functional mobility, minimize fall risk and decrease caregiver burden for planned discharge home with 24 hour supervision.  Anticipate patient will benefit from follow up [place] at discharge.  PT - End of Session Activity Tolerance: Tolerates 10 - 20 min activity with multiple rests Endurance Deficit: Yes PT Assessment Rehab Potential (ACUTE/IP ONLY): Good PT Barriers to Discharge: [place] home environment;Decreased caregiver support;Medical stability;Home environment access/layout;Medication compliance PT Patient demonstrates impairments in the following area(s): Balance;Behavior;Edema;Endurance;Perception;Motor;Nutrition;Safety;Sensory;Skin Integrity PT Transfers Functional Problem(s): Bed Mobility;Bed to Chair;Car;Furniture;Floor;Other (comment) PT Locomotion Functional  Problem(s): Ambulation;Wheelchair Mobility;Stairs PT Plan PT Intensity: Minimum of 1-2 x/day ,45  to 90 minutes PT Frequency: 5 out of 7 days PT Duration Estimated Length of Stay: 10-14 days PT Treatment/Interventions: Ambulation/gait training;Discharge planning;Therapeutic Activities;Functional mobility training;Psychosocial support;Visual/perceptual remediation/compensation;Wheelchair propulsion/positioning;Therapeutic Exercise;Skin care/wound management;Neuromuscular re-education;Disease management/prevention;Balance/vestibular training;Cognitive remediation/compensation;DME/adaptive equipment instruction;Pain management;Splinting/orthotics;UE/LE Strength taining/ROM;UE/LE Coordination activities;Stair training;Patient/family education;Functional electrical stimulation;Community reintegration PT Transfers Anticipated Outcome(s): Supervision assist and with LRAD PT Locomotion Anticipated Outcome(s): Ambulatory with LRAD at supervision assist level PT Recommendation Recommendations for Other Services: Therapeutic Recreation consult Therapeutic Recreation Interventions: Outing/community reintergration;Stress management Follow Up Recommendations: Home health PT Patient destination: Home Equipment Recommended: To be determined;Rolling walker with 5" wheels  Skilled Therapeutic Intervention .Pt received sitting in WC and agreeable to PT. Pt reports that he had just been incontinent. Sit<>stand with RW to change into clean brief and pants, min assist overall. PT instructed patient in PT Evaluation and initiated treatment intervention; see below for results. PT educated patient in [place], rehab potential, rehab goals, and discharge recommendations. PT instructed pt in gait training without AD as listed below and with RW x 159f and min assist, noted GR in the RLE. Stair management on the curb step with mod assist and ax cues for safety and step to gait pattern. Car transfer training with mod assist to prevent  posterior LOB Pt returned to room and bed with RW and stand pivot technique. Sit>supine completed with min assist as listed below, and left supine in bed with call bell in reach and all needs met.      PT Evaluation Precautions/Restrictions Precautions Precautions: Fall Precaution Comments: Rt hemiplegia Pain   denies Home Living/Prior Functioning Home Living Available Help at Discharge: Family;Available 24 hours/day(Dtr Felicia planning to arrange 24/7 assist for pt) Type of Home: Apartment Home Access: Level entry Home Layout: One level;Multi-level Bathroom Shower/Tub: TChiropodist Standard Bathroom Accessibility: Yes Additional Comments: daughter, [place] called during session, reports 24/7 can be arranged  Lives With: Alone Prior Function Level of Independence: Independent with basic ADLs;Independent with homemaking with ambulation Driving: Yes Vocation: Retired Comments: Question accuracy of history given conflicting answers and pt being oriented to person only. driving, independnet IADLs Vision/Perception  Vision - Assessment Eye Alignment: Impaired (comment) Ocular Range of Motion: Restricted on the right Tracking/Visual Pursuits: Decreased smoothness of eye movement to RIGHT superior field;Decreased smoothness of eye movement to RIGHT inferior field Saccades: Additional eye shifts occurred during testing;Undershoots Diplopia Assessment: Objects split side to side Perception Perception: Impaired Inattention/Neglect: Does not attend to right side of body Praxis Praxis: Impaired Praxis Impairment Details: Motor planning;Perseveration;Initiation  Cognition Overall Cognitive Status: Impaired/Different from baseline Arousal/Alertness: Awake/alert Orientation Level: Oriented to person;Disoriented to time;Disoriented to situation;Disoriented to place Attention: Selective Selective Attention: Appears intact Memory: Impaired Memory Impairment:  Decreased recall of new information Immediate Memory Recall: Sock;Bed Memory Recall Sock: With Cue Memory Recall Blue: Not able to recall Memory Recall Bed: Without Cue Problem Solving: Impaired(pt urinating on floor during session and needing vcs for problem solving and safety) Safety/Judgment: Appears intact Sensation Sensation Light Touch: Impaired Detail Light Touch Impaired Details: Impaired RUE;Impaired RLE Additional Comments: able to detect all stimuli, but unable to detect stimuli on the R when BLE touched Coordination Gross Motor Movements are Fluid and Coordinated: No Fine Motor Movements are Fluid and Coordinated: No Coordination and Movement Description: Ataxic with impaired postural control and Rt hemiparesis Finger Nose Finger Test: WNL Lt,  mild ataxia Rt Heel Shin Test: mild dysmetria on the R Motor  Motor Motor: Hemiplegia;Ataxia;Motor apraxia;Abnormal postural alignment and control  Mobility Bed Mobility  Bed Mobility: Rolling Right;Rolling Left;Sit to Supine;Supine to Sit Rolling Right: Minimal Assistance - Patient > 75% Rolling Left: Minimal Assistance - Patient > 75% Supine to Sit: Minimal Assistance - Patient > 75% Sit to Supine: Minimal Assistance - Patient > 75% Transfers Transfers: Sit to Stand;Stand Pivot Transfers Sit to Stand: Minimal Assistance - Patient > 75% Stand Pivot Transfers: Minimal Assistance - Patient > 75% Transfer (Assistive device): None Locomotion  Gait Ambulation: Yes Gait Assistance: Moderate Assistance - Patient 50-74% Gait Distance (Feet): 50 Feet Assistive device: None Gait Gait: Yes Gait Pattern: Impaired Gait Pattern: Right genu recurvatum;Narrow base of support Stairs / Additional Locomotion Stairs: Yes Stairs Assistance: Moderate Assistance - Patient 50 - 74% Number of Stairs: 2 Wheelchair Mobility Wheelchair Mobility: Yes Wheelchair Assistance: Moderate Assistance - Patient 50 - 74% Wheelchair Propulsion: Both  upper extremities Wheelchair Parts Management: Needs assistance Distance: 75  Trunk/Postural Assessment  Cervical Assessment Cervical Assessment: Exceptions to WFL(forward head) Thoracic Assessment Thoracic Assessment: Exceptions to WFL(rounded shoulders) Lumbar Assessment Lumbar Assessment: Exceptions to WFL(posterior pelvic tilt) Postural Control Postural Control: Deficits on evaluation(Pt losing his balance backwards during dynamic sitting tasks EOB, able to recover without assistance)  Balance Balance Balance Assessed: Yes Dynamic Sitting Balance Dynamic Sitting - Balance Support: During functional activity;No upper extremity supported Dynamic Sitting - Level of Assistance: 5: Stand by assistance(donning gripper socks EOB) Dynamic Standing Balance Dynamic Standing - Balance Support: No upper extremity supported;Right upper extremity supported;During functional activity Dynamic Standing - Level of Assistance: 4: Min assist;3: Mod assist Dynamic Standing - Balance Activities: Lateral lean/weight shifting;Forward lean/weight shifting;Reaching for objects(perihygiene completion, standing with RW) Extremity Assessment  RUE Assessment RUE Assessment: (Ataxic + apraxic) Active Range of Motion (AROM) Comments: ~130 degrees shoulder flexion, ~150 degrees shoulder abduction, WNL other ranges General Strength Comments: 14# hand strength via dynamometer testing (average of 3 trials) LUE Assessment Active Range of Motion (AROM) Comments: WNL General Strength Comments: 68# hand strength via dynamometer testing (average of 3 trials) RLE Assessment RLE Assessment: Within Functional Limits General Strength Comments: grossly 4+/5 proximal to distal LLE Assessment LLE Assessment: Within Functional Limits    Refer to Care Plan for Long Term Goals  Recommendations for other services: Therapeutic Recreation  Stress management and Outing/community reintegration  Discharge Criteria: Patient  will be discharged from PT if patient refuses treatment 3 consecutive times without medical reason, if treatment goals not met, if there is a change in medical status, if patient makes no progress towards goals or if patient is discharged from hospital.  The above assessment, treatment plan, treatment alternatives and goals were discussed and mutually agreed upon: by patient  Lorie Phenix 05/21/2019, 2:12 PM

## 2019-05-22 DIAGNOSIS — I69351 Hemiplegia and hemiparesis following cerebral infarction affecting right dominant side: Secondary | ICD-10-CM | POA: Diagnosis not present

## 2019-05-22 LAB — GLUCOSE, CAPILLARY
Glucose-Capillary: 91 mg/dL (ref 70–99)
Glucose-Capillary: 112 mg/dL — ABNORMAL HIGH (ref 70–99)
Glucose-Capillary: 107 mg/dL — ABNORMAL HIGH (ref 70–99)
Glucose-Capillary: 141 mg/dL — ABNORMAL HIGH (ref 70–99)

## 2019-05-22 NOTE — Progress Notes (Signed)
[place] PHYSICAL MEDICINE & REHABILITATION PROGRESS NOTE   Subjective/Complaints: No complaints this morning. Denis pain or constipation. Sleeping well at night.  Still with word finding difficulties   Objective:   No results found. Recent Labs    05/20/19 1657  WBC 10.5  HGB 13.4  HCT 42.9  PLT 237   Recent Labs    05/20/19 1657  CREATININE 0.91    Intake/Output Summary (Last 24 hours) at 05/22/2019 1518 Last data filed at 05/22/2019 1415 Gross per 24 hour  Intake 222 ml  Output 200 ml  Net 22 ml     Physical Exam: Vital Signs Blood pressure 129/60, pulse 60, temperature (!) 97.5 F (36.4 C), temperature source Oral, resp. rate 18, height 6\' 3"  (1.905 m), weight 73.5 kg, SpO2 100 %. Vitals reviewed. Constitutional: He is oriented to person, place, and time. He appears well-developed and well-nourished.  HENT:  Head: Normocephalic and atraumatic.  Eyes: EOM are normal. Right eye exhibits no discharge. Left eye exhibits no discharge.  Neck: No tracheal deviation present. No thyromegaly present.  Respiratory: Effort normal. No stridor. No respiratory distress.  GI: Soft. He exhibits no distension.  Musculoskeletal:     Comments: No edema or tenderness in extremities  Neurological: He is alert and oriented to person, place, and time.  Patient is alert in no acute distress.   Follows commands and makes good eye contact with examiner.   Dysarthria, word finding difficulties.  Fair awareness of his deficits. Right facial weakness Motor: Right upper extremity: 3+/5 proximal distal Right lower extremity: 4-4+/5 proximal distal Left upper extremity/left lower extremity: 5/5 proximal distal Expressive aphasia. Difficulty with naming tasks.  Skin: Skin is warm and dry.  Psychiatric: He has a normal mood and affect. His behavior is normal.     Assessment/Plan: 1. 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 which require 3+ hours per day of interdisciplinary  therapy in a comprehensive inpatient rehab setting.  Physiatrist is providing close team supervision and 24 hour management of active medical problems listed below.  Physiatrist and rehab team continue to assess barriers to discharge/monitor patient progress toward functional and medical goals  Care Tool:  Bathing    Body parts bathed by patient: Right arm, Left arm, Chest, Abdomen, Front perineal area, Buttocks, Right upper leg, Left upper leg, Right lower leg, Left lower leg, Face         Bathing assist Assist Level: Minimal Assistance - Patient > 75%     Upper Body Dressing/Undressing Upper body dressing   What is the patient wearing?: Pull over shirt    Upper body assist Assist Level: Supervision/Verbal cueing    Lower Body Dressing/Undressing Lower body dressing      What is the patient wearing?: Incontinence brief, Pants     Lower body assist Assist for lower body dressing: Moderate Assistance - Patient 50 - 74%     Toileting Toileting    Toileting assist Assist for toileting: Moderate Assistance - Patient 50 - 74%     Transfers Chair/bed transfer  Transfers assist     Chair/bed transfer assist level: Minimal Assistance - Patient > 75%     Locomotion Ambulation   Ambulation assist      Assist level: Moderate Assistance - Patient 50 - 74% Assistive device: Hand held assist Max distance: 40   Walk 10 feet activity   Assist     Assist level: Moderate Assistance - Patient - 50 - 74% Assistive device: Hand held assist  Walk 50 feet activity   Assist Walk 50 feet with 2 turns activity did not occur: Safety/medical concerns         Walk 150 feet activity   Assist Walk 150 feet activity did not occur: Safety/medical concerns         Walk 10 feet on uneven surface  activity   Assist Walk 10 feet on uneven surfaces activity did not occur: Safety/medical concerns         Wheelchair     Assist   Type of Wheelchair:  Manual    Wheelchair assist level: Moderate Assistance - Patient 50 - 74% Max wheelchair distance: 75    Wheelchair 50 feet with 2 turns activity    Assist        Assist Level: Moderate Assistance - Patient 50 - 74%   Wheelchair 150 feet activity     Assist  Wheelchair 150 feet activity did not occur: Safety/medical concerns       Blood pressure 129/60, pulse 60, temperature (!) 97.5 F (36.4 C), temperature source Oral, resp. rate 18, height 6\' 3"  (1.905 m), weight 73.5 kg, SpO2 100 %.  Medical Problem List and Plan: 1.  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             -patient may shower             -ELOS/Goals: 10-15 days/Supervision           1/30:  Initial CIR evals today. Notes reviewed. Ambulated 120 feet with RW today, difficult with naming tasks.  2.  Antithrombotics: -DVT/anticoagulation: Lovenox             -antiplatelet therapy: Aspirin 325 mg daily and Plavix 75 mg daily x3 months and aspirin alone 3. Pain Management: Tylenol as needed  1/30, 1/31: Pain well controlled.  4. Mood: Provide emotional support             -antipsychotic agents: N/A 5. Neuropsych: This patient is ?fully capable of making decisions on his own behalf. 6. Skin/Wound Care: Routine skin checks 7. Fluids/Electrolytes/Nutrition: Routine in and outs.  8.  History of tobacco abuse as well as marijuana.  Provide counseling 9.  Hyperlipidemia.  Cont Lipitor.  10.  Diabetes mellitus.  Hemoglobin A1c 5.4.  SSI.  Patient on no medications prior to admission             Monitor with increased mobility.  11.  3.3 cm right thyroid nodule.  Follow-up as outpatient with ultrasound 12. Bradycardia: HR decreased to 56 today. Flowsheet reviewed. Has been borderline bradycardic. Continue to monitor daily.     LOS: 2 days A FACE TO FACE EVALUATION WAS PERFORMED  Clide Deutscher  Gresia Isidoro 05/22/2019, 3:18 PM

## 2019-05-22 NOTE — Progress Notes (Signed)
Working on timed toileting with patient. 2 incontinent episodes, then independently used urinal.

## 2019-05-23 ENCOUNTER — Inpatient Hospital Stay (HOSPITAL_COMMUNITY): Payer: Non-veteran care | Admitting: Speech Pathology

## 2019-05-23 ENCOUNTER — Inpatient Hospital Stay (HOSPITAL_COMMUNITY): Payer: Non-veteran care | Admitting: Occupational Therapy

## 2019-05-23 ENCOUNTER — Inpatient Hospital Stay (HOSPITAL_COMMUNITY): Payer: Non-veteran care | Admitting: Physical Therapy

## 2019-05-23 LAB — CBC WITH DIFFERENTIAL/PLATELET
Abs Immature Granulocytes: 0.04 10*3/uL (ref 0.00–0.07)
Basophils Absolute: 0.1 10*3/uL (ref 0.0–0.1)
Basophils Relative: 0 %
Eosinophils Absolute: 0.4 10*3/uL (ref 0.0–0.5)
Eosinophils Relative: 4 %
HCT: 43.4 % (ref 39.0–52.0)
Hemoglobin: 13.8 g/dL (ref 13.0–17.0)
Immature Granulocytes: 0 %
Lymphocytes Relative: 30 %
Lymphs Abs: 3.5 10*3/uL (ref 0.7–4.0)
MCH: 27.1 pg (ref 26.0–34.0)
MCHC: 31.8 g/dL (ref 30.0–36.0)
MCV: 85.3 fL (ref 80.0–100.0)
Monocytes Absolute: 0.9 10*3/uL (ref 0.1–1.0)
Monocytes Relative: 8 %
Neutro Abs: 6.7 10*3/uL (ref 1.7–7.7)
Neutrophils Relative %: 58 %
Platelets: 336 10*3/uL (ref 150–400)
RBC: 5.09 MIL/uL (ref 4.22–5.81)
RDW: 14.3 % (ref 11.5–15.5)
WBC: 11.6 10*3/uL — ABNORMAL HIGH (ref 4.0–10.5)
nRBC: 0 % (ref 0.0–0.2)

## 2019-05-23 LAB — GLUCOSE, CAPILLARY
Glucose-Capillary: 102 mg/dL — ABNORMAL HIGH (ref 70–99)
Glucose-Capillary: 134 mg/dL — ABNORMAL HIGH (ref 70–99)
Glucose-Capillary: 123 mg/dL — ABNORMAL HIGH (ref 70–99)
Glucose-Capillary: 82 mg/dL (ref 70–99)

## 2019-05-23 LAB — COMPREHENSIVE METABOLIC PANEL
ALT: 43 U/L (ref 0–44)
AST: 29 U/L (ref 15–41)
Albumin: 2.7 g/dL — ABNORMAL LOW (ref 3.5–5.0)
Alkaline Phosphatase: 92 U/L (ref 38–126)
Anion gap: 12 (ref 5–15)
BUN: 17 mg/dL (ref 8–23)
CO2: 26 mmol/L (ref 22–32)
Calcium: 9.4 mg/dL (ref 8.9–10.3)
Chloride: 99 mmol/L (ref 98–111)
Creatinine, Ser: 1.26 mg/dL — ABNORMAL HIGH (ref 0.61–1.24)
GFR calc Af Amer: >60 mL/min (ref >60)
GFR calc non Af Amer: 56 mL/min — ABNORMAL LOW (ref >60)
Glucose, Bld: 106 mg/dL — ABNORMAL HIGH (ref 70–99)
Potassium: 4.4 mmol/L (ref 3.5–5.1)
Sodium: 137 mmol/L (ref 135–145)
Total Bilirubin: 0.5 mg/dL (ref 0.3–1.2)
Total Protein: 6.2 g/dL — ABNORMAL LOW (ref 6.5–8.1)

## 2019-05-23 MED ORDER — SENNOSIDES-DOCUSATE SODIUM 8.6-50 MG PO TABS
2.0000 | ORAL_TABLET | Freq: Two times a day (BID) | ORAL | Status: DC
  Administered 2019-05-23 – 2019-06-03 (×22): 2 via ORAL
  Filled 2019-05-23 (×23): qty 2

## 2019-05-23 NOTE — Care Management (Signed)
[facility] Individual Statement of Services  Patient Name:  Ruben Moore  Date:  05/23/2019  Welcome to the [facility].  Our goal is to provide you with an individualized program based on your diagnosis and situation, designed to meet your specific needs.  With this comprehensive rehabilitation program, you will be expected to participate in at least 3 hours of rehabilitation therapies Monday-Friday, with modified therapy programming on the weekends.  Your rehabilitation program will include the following services:  Physical Therapy (PT), Occupational Therapy (OT), Speech Therapy (ST), 24 hour per day rehabilitation nursing, Neuropsychology, Case Management (Social Worker), Rehabilitation Medicine, Nutrition Services and Pharmacy Services  Weekly team conferences will be held on Wednesdays to discuss your progress.  Your Social Worker will talk with you frequently to get your input and to update you on team discussions.  Team conferences with you and your family in attendance may also be held.  Expected length of stay: 12-12 days Overall anticipated outcome: Supervision overall/CGA for stairs  Depending on your progress and recovery, your program may change. Your Social Worker will coordinate services and will keep you informed of any changes. Your Social Worker's name and contact numbers are listed  below.  The following services may also be recommended but are not provided by the [facility] will be made to provide these services after discharge if needed.  Arrangements include referral to agencies that provide these services.  Your insurance has been verified to be:  Medicare Your primary doctor is:  [facility] - [facility]  Pertinent information will be shared with your doctor and your insurance  company.  Social Worker:  [place], [place] or ([phone]   Information discussed with and copy given to patient by: Margarito Liner, 05/23/2019, 3:09 PM

## 2019-05-23 NOTE — Progress Notes (Signed)
Speech Language Pathology Daily Session Note  Patient Details  Name: Ruben Moore MRN: [id number] Date of Birth: December 02, 1945  Today's Date: 05/23/2019 SLP Individual Time: 0800-0900 SLP Individual Time Calculation (min): 60 min  Short Term Goals: Week 1: SLP Short Term Goal 1 (Week 1): Pt will utilize word finding strategies to create list of 10 words within specified category with Min A cues. SLP Short Term Goal 2 (Week 1): Pt will utilize word finding strategies to convey basic information with Min A cues. SLP Short Term Goal 3 (Week 1): Pt will self-monitor and self-correct word finding errors in 7 out of 10 opportunities with Mod A cues. SLP Short Term Goal 4 (Week 1): Pt will implement speech intelligibility strategies to increase speech intelligiblity at simple conversation to >90% intelligibility and Min A cues.  Skilled Therapeutic Interventions:  Skilled treatment session targeted expressive communication. SLP facilitated session by continuing to target expression of cell phone password, company names for bills. SLP allowed pt to type in cell phone password utilizing SLP's personal cell phone and note app. Pt able to type in password without difficulty. SLP then typed in password into pt's phone with successful access. With pt's permission, lock screen was disabled to allow him access as his lock screen rapidly disappears. SLP facilitated written expression of company names and bill amounts. Pt was perseverative on word "payment" but able to break perseveration when check design drawn. Information was written down for pt's daughter (per his request). Given written category, pt required basic descriptors to generate list of animals and furniture. Pt with increase awareness of perseverative errors as he would cross them out.   Of note, MD in during session and reports that pt is mildly dehydrated. Pt reported some difficulty with swallowing. Pt passed [place] during this session  and consumed package of graham crackers without any overt s/s of dysphagia or aspiration. Will continue to monitor if any other s/s arise.      Pain    Therapy/Group: Individual Therapy  Ruben Moore 05/23/2019, 9:19 AM

## 2019-05-23 NOTE — IPOC Note (Signed)
Overall Plan of Care [facility]) Patient Details Name: Ruben Moore MRN: [id number] DOB: [birth year]  Admitting Diagnosis: Left middle cerebral artery stroke [facility])  Hospital Problems: Principal Problem:   Left middle cerebral artery stroke [facility])     Functional Problem List: Nursing    PT Balance, Behavior, Edema, Endurance, Perception, Motor, Nutrition, Safety, Sensory, Skin Integrity  OT Balance, Perception, Cognition, Endurance, Vision, Motor  SLP Linguistic  TR         Basic ADL's: OT Grooming, Bathing, Dressing, Toileting     Advanced  ADL's: OT Simple Meal Preparation     Transfers: PT Bed Mobility, Bed to Chair, Car, Furniture, Floor, Other (comment)  OT Toilet, Tub/Shower     Locomotion: PT Ambulation, Wheelchair Mobility, Stairs     Additional Impairments: OT Fuctional Use of Upper Extremity  SLP Communication expression    TR      Anticipated Outcomes Item Anticipated Outcome  Self Feeding No goal  Swallowing      Basic self-care  Supervision/cuing  Toileting  Supervision/cuing   Bathroom Transfers Supervision/cuing  Bowel/Bladder     Transfers  Supervision assist and with LRAD  Locomotion  Ambulatory with LRAD at supervision assist level  Communication  Supervision  Cognition     Pain     Safety/Judgment      Therapy Plan: PT Intensity: Minimum of 1-2 x/day ,45 to 90 minutes PT Frequency: 5 out of 7 days PT Duration Estimated Length of Stay: 10-14 days OT Intensity: Minimum of 1-2 x/day, 45 to 90 minutes OT Frequency: 5 out of 7 days OT Duration/Estimated Length of Stay: 10-12 days SLP Intensity: Minumum of 1-2 x/day, 30 to 90 minutes SLP Frequency: 3 to 5 out of 7 days SLP Duration/Estimated Length of Stay: 10-12 days   Due to the current state of emergency, patients may not be receiving their 3-hours of Medicare-mandated therapy.   Team Interventions: Nursing Interventions    PT interventions Ambulation/gait training, Discharge  planning, Therapeutic Activities, Functional mobility training, Psychosocial support, Visual/perceptual remediation/compensation, Wheelchair propulsion/positioning, Therapeutic Exercise, Skin care/wound management, Neuromuscular re-education, Disease management/prevention, Training and development officer, Cognitive remediation/compensation, DME/adaptive equipment instruction, Pain management, Splinting/orthotics, UE/LE Strength taining/ROM, UE/LE Coordination activities, Stair training, Patient/family education, Functional electrical stimulation, Community reintegration  OT Interventions Training and development officer, Discharge planning, Pain management, Therapeutic Activities, Self Care/advanced ADL retraining, UE/LE Coordination activities, Visual/perceptual remediation/compensation, Therapeutic Exercise, Patient/family education, Functional mobility training, Disease mangement/prevention, Cognitive remediation/compensation, Academic librarian, Engineer, drilling, Neuromuscular re-education, Psychosocial support, UE/LE Strength taining/ROM  SLP Interventions Speech/Language facilitation, Therapeutic Activities, Patient/family education, Functional tasks  TR Interventions    SW/CM Interventions     Barriers to Discharge MD  Medical stability  Nursing      PT Inaccessible home environment, Decreased caregiver support, Medical stability, Home environment access/layout, Medication compliance    OT Medical stability, Incontinence    SLP      SW       Team Discharge Planning: Destination: PT-Home ,OT- Home , SLP-Home Projected Follow-up: PT-Home health PT, OT-  Home health OT, SLP-(TBD) Projected Equipment Needs: PT-To be determined, Rolling walker with 5" wheels, OT- To be determined, SLP-  Equipment Details: PT- , OT-  Patient/family involved in discharge planning: PT- Patient,  OT-Patient, SLP-Patient  MD ELOS: 10-14d Medical Rehab Prognosis:  Good Assessment:  74 year old  right-handed male with history of bladder cancer, chronic left carotid occlusion followed at the [facility], diet controlled diabetes mellitus and tobacco abuse.  History taken from chart review and patient due to  expressive aphasia.  Patient on no prescription medications.  Patient lives alone independent prior to admission.  He is retired.  He does have a daughter in the area that works.  He presented on 05/17/2019 with right hemiparesis and dysarthria.  Noted systolic blood pressure in the 150s.  SARS coronavirus negative, potassium 6.0, troponin negative, urine drug screen positive marijuana, alcohol level negative.  Cranial CT scan showed left insular cortex, left basal ganglia, corona radiata infarct  likely acute to subacute.  No midline shift.  Patient did not receive TPA.  MRI of the brain patchy multifocal acute to early subacute ischemia left MCA territory.  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  CTA of head and neck left ICA occlusion at its origin.  No significant intracranial stenosis.  Incidental finding of a 3.3 cm right thyroid nodule recommend thyroid ultrasound as outpatient.  Echocardiogram with EF of 65% without emboli. Neurology consulted maintain on aspirin 325 mg daily and Plavix 75 mg daily for 3 months then aspirin alone.  Subcutaneous Lovenox for DVT prevention   Now requiring 24/7 Rehab RN,MD, as well as CIR level PT, OT and SLP.  Treatment team will focus on ADLs and mobility with goals set at Sup   See Team Conference Notes for weekly updates to the plan of care

## 2019-05-23 NOTE — Progress Notes (Signed)
Physical Therapy Session Note  Patient Details  Name: Cyan Moultrie MRN: [id number] Date of Birth: Mar 16, 1946  Today's Date: 05/23/2019 PT Individual Time: 1005-1105 PT Individual Time Calculation (min): 60 min   Short Term Goals: Week 1:  PT Short Term Goal 1 (Week 1): Pt will perform stand pivot transfers with CGA and LRAD PT Short Term Goal 2 (Week 1): Pt will ambulate 156f with CGA and LRAD PT Short Term Goal 3 (Week 1): Pt will propell WC x 1043fwith supervision assist PT Short Term Goal 4 (Week 1): Pt will ascend 4 steps with CGA and BUE support  Skilled Therapeutic Interventions/Progress Updates: Pt presented in bed agreeable to therapy. Pt denies pain at beginning of session. Pt performed supine to sit CGA with use of bed features. PTA obtained and donned pants maxA for time management. Performed STS from EOB CGA however noted R knee hyperextension upon standing. Pt then indicated urgent need to void then almost immediately followed by episode of urinary incontinence. Pt then ambulated to bathroom performing toilet transfer with minA and use of RW with minA toilet transfers and continent BM. Performed pericare in sitting with minA then pt standing to allow PTA to wipe to ensure cleanliness. Pt then ambulated to w/c and performed hand hygiene at w/c level. Pt required verbal cues to reach for soap, turn on water, and reach for paper towels. Performed w/c mobility with minA with HOH assist initially for increasing R shoulder extension and maintaining grip on wheel rim. Pt transported remaining distance to rehab gym and performed stand pivot transfer to mat minA. PTA added theraband to w/c rim to assist with grip and provide feedback for w/c mobilty. PTA also obtained w/c cushion as pt performed grasping and placing yellow clothespins on rod. Pt performed blocked practice STS with RW for reinforcement of hand placement and BLE strengthening. PTA also providing cues to decrease hyperextension  upon standing. Performed toe taps to 4in step with use of knee cage on RLE for feedback and also as pt indicating increased pain at R knee due to hyperextension. Pt also participated in one bout of horseshoes for standing balance and forced use of RUE. Pt was able to reach across midline and grasp horseshoe but unable to release. Pt returned to w/c in same manner as prior and transported back to room. Performed ambulatory transfer to recliner with minA and pt remained in recliner at end of session with belt alarm on, call bell within reach and needs met.      Therapy Documentation Precautions:  Precautions Precautions: Fall Precaution Comments: Rt hemiplegia Restrictions Weight Bearing Restrictions: No General:   Vital Signs: Therapy Vitals Temp: 97.8 F (36.6 C) Pulse Rate: 66 Resp: 20 BP: (!) 110/56 Patient Position (if appropriate): Sitting Oxygen Therapy SpO2: 98 % O2 Device: Room Air Pain:   Mobility:   Locomotion :    Trunk/Postural Assessment :    Balance:   Exercises:   Other Treatments:      Therapy/Group: Individual Therapy  Vala Raffo 05/23/2019, 3:55 PM

## 2019-05-23 NOTE — Progress Notes (Signed)
[place] PHYSICAL MEDICINE & REHABILITATION PROGRESS NOTE   Subjective/Complaints:  No issue overnite.  Pt c/o constipation  ROS- denies abd pain, no CP, SOB  Objective:   No results found. Recent Labs    05/20/19 1657 05/23/19 0611  WBC 10.5 11.6*  HGB 13.4 13.8  HCT 42.9 43.4  PLT 237 336   Recent Labs    05/20/19 1657 05/23/19 0611  NA  --  137  K  --  4.4  CL  --  99  CO2  --  26  GLUCOSE  --  106*  BUN  --  17  CREATININE 0.91 1.26*  CALCIUM  --  9.4    Intake/Output Summary (Last 24 hours) at 05/23/2019 0849 Last data filed at 05/23/2019 0230 Gross per 24 hour  Intake 702 ml  Output 800 ml  Net -98 ml     Physical Exam: Vital Signs Blood pressure (!) 117/55, pulse 62, temperature (!) 97.5 F (36.4 C), resp. rate 18, height 6\' 3"  (1.905 m), weight 73.5 kg, SpO2 100 %. Vitals reviewed. Constitutional: He is oriented to person, place, and time. He appears well-developed and well-nourished.  HENT:  Head: Normocephalic and atraumatic.  Eyes: EOM are normal. Right eye exhibits no discharge. Left eye exhibits no discharge.  Neck: No tracheal deviation present. No thyromegaly present.  Respiratory: Effort normal. No stridor. No respiratory distress.  GI: Soft. He exhibits no distension.  Musculoskeletal:     Comments: No edema or tenderness in extremities  Neurological: He is alert and oriented to person, place, and time.  Patient is alert in no acute distress.   Follows commands and makes good eye contact with examiner.   Dysarthria, word finding difficulties.  Fair awareness of his deficits. Right facial weakness Motor: Right upper extremity: 3+/5 proximal distal Right lower extremity: 4-4+/5 proximal distal- pt requires some cues to activate Left upper extremity/left lower extremity: 5/5 proximal distal Expressive aphasia. Difficulty with naming tasks.  Skin: Skin is warm and dry.  Psychiatric: He has a normal mood and affect. His behavior is normal.      Assessment/Plan: 1. 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 which require 3+ hours per day of interdisciplinary therapy in a comprehensive inpatient rehab setting.  Physiatrist is providing close team supervision and 24 hour management of active medical problems listed below.  Physiatrist and rehab team continue to assess barriers to discharge/monitor patient progress toward functional and medical goals  Care Tool:  Bathing    Body parts bathed by patient: Right arm, Left arm, Chest, Abdomen, Front perineal area, Buttocks, Right upper leg, Left upper leg, Right lower leg, Left lower leg, Face         Bathing assist Assist Level: Minimal Assistance - Patient > 75%     Upper Body Dressing/Undressing Upper body dressing   What is the patient wearing?: Pull over shirt    Upper body assist Assist Level: Supervision/Verbal cueing    Lower Body Dressing/Undressing Lower body dressing      What is the patient wearing?: Incontinence brief, Pants     Lower body assist Assist for lower body dressing: Moderate Assistance - Patient 50 - 74%     Toileting Toileting    Toileting assist Assist for toileting: Moderate Assistance - Patient 50 - 74%(at times independent with urinal, at times incontinent)     Transfers Chair/bed transfer  Transfers assist     Chair/bed transfer assist level: Minimal Assistance - Patient > 75%  Locomotion Ambulation   Ambulation assist      Assist level: Moderate Assistance - Patient 50 - 74% Assistive device: Hand held assist Max distance: 40   Walk 10 feet activity   Assist     Assist level: Moderate Assistance - Patient - 50 - 74% Assistive device: Hand held assist   Walk 50 feet activity   Assist Walk 50 feet with 2 turns activity did not occur: Safety/medical concerns         Walk 150 feet activity   Assist Walk 150 feet activity did not occur: Safety/medical concerns         Walk 10 feet on  uneven surface  activity   Assist Walk 10 feet on uneven surfaces activity did not occur: Safety/medical concerns         Wheelchair     Assist   Type of Wheelchair: Manual    Wheelchair assist level: Moderate Assistance - Patient 50 - 74% Max wheelchair distance: 75    Wheelchair 50 feet with 2 turns activity    Assist        Assist Level: Moderate Assistance - Patient 50 - 74%   Wheelchair 150 feet activity     Assist  Wheelchair 150 feet activity did not occur: Safety/medical concerns       Blood pressure (!) 117/55, pulse 62, temperature (!) 97.5 F (36.4 C), resp. rate 18, height 6\' 3"  (1.905 m), weight 73.5 kg, SpO2 100 %.  Medical Problem List and Plan: 1.  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       CIR PT, OT, SLP  2.  Antithrombotics: -DVT/anticoagulation: Lovenox             -antiplatelet therapy: Aspirin 325 mg daily and Plavix 75 mg daily x3 months and aspirin alone 3. Pain Management: Tylenol as needed  No c/o pain  4. Mood: Provide emotional support             -antipsychotic agents: N/A 5. Neuropsych: This patient is ?fully capable of making decisions on his own behalf. 6. Skin/Wound Care: Routine skin checks 7. Fluids/Electrolytes/Nutrition: Routine in and outs.  8.  History of tobacco abuse as well as marijuana.  Provide counseling 9.  Hyperlipidemia.  Cont Lipitor.  10.  Diabetes mellitus.  Hemoglobin A1c 5.4.  SSI.  Patient on no medications prior to admission             Monitor with increased mobility.  11.  3.3 cm right thyroid nodule.  Follow-up as outpatient with ultrasound 12. Bradycardia:  Vitals:   05/22/19 1959 05/23/19 0554  BP: 127/65 (!) 117/55  Pulse: 62 62  Resp: 16 18  Temp: (!) 97.5 F (36.4 C) (!) 97.5 F (36.4 C)  SpO2: 100% 100%    13.  Constipation increased Senna S  to 2 po BID    LOS: 3 days A FACE TO FACE EVALUATION WAS PERFORMED  Charlett Blake 05/23/2019, 8:49 AM

## 2019-05-23 NOTE — Progress Notes (Signed)
Inpatient Rehabilitation  Patient information reviewed and entered into eRehab system by Cimone Fahey M. Deyanira Fesler, M.A., CCC/SLP, PPS Coordinator.  Information including medical coding, functional ability and quality indicators will be reviewed and updated through discharge.    

## 2019-05-23 NOTE — Progress Notes (Signed)
Occupational Therapy Session Note  Patient Details  Name: Ruben Moore MRN: [id number] Date of Birth: 1946-04-21  Today's Date: 05/23/2019 OT Individual Time: 1405-1506 OT Individual Time Calculation (min): 61 min   Short Term Goals: Week 1:  OT Short Term Goal 1 (Week 1): Pt will complete 1/3 components of toileting with Min balance assist OT Short Term Goal 2 (Week 1): Pt will complete shower transfer with Min A and LRAD OT Short Term Goal 3 (Week 1): Pt will complete 2 grooming tasks while standing at the sink to improve standing endurance  Skilled Therapeutic Interventions/Progress Updates:    Pt greeted in recliner with no c/o pain, agreeable to shower. He ambulated into bathroom using RW with Min A, vcs for improved Rt knee control due to hyperextension. Pt doffed clothing with Min A for balance and vcs for Rt hand placement on grab bar. He then bathed at sit<stand level, vcs for using R UE for functional reach and Min A to place soap bottle back on shelf. Min A for standing balance during perihygiene. He then dressed sit<stand while sitting on toilet. Had pt reach with Rt for clothing and ADL items to work on [place] gross motor control. Pt does not have the hand strength with Rt to maintain grip on pants or fabric of gripper socks, needing to implement one handed strategies for LB dressing. CGA for balance while he pulled each LE onto knee for figure 4 to don socks. Mod A for dynamic balance while pt tried to tie his pants up using both hands, noted anterior sway. Pt ultimately needed assist for this task. Supervision for donning overhead shirt using hemi strategies while seated. He refused to complete oral care today. Min A for ambulatory transfer back to recliner using device, vcs for widening base of support for safety. Provided pt with 2 HEPs to address hand strength and [facility] for R UE. Reviewed techniques together with pt able to demonstrate carryover of education. Left him with all needs  within reach and chair alarm set.    Therapy Documentation Precautions:  Precautions Precautions: Fall Precaution Comments: Rt hemiplegia Restrictions Weight Bearing Restrictions: No Vital Signs: Therapy Vitals Temp: 97.8 F (36.6 C) Pulse Rate: 66 Resp: 20 BP: (!) 110/56 Patient Position (if appropriate): Sitting Oxygen Therapy SpO2: 98 % O2 Device: Room Air ADL: ADL Eating: Not assessed Grooming: Moderate assistance Where Assessed-Grooming: Chair Upper Body Bathing: Supervision/safety Where Assessed-Upper Body Bathing: Edge of bed Lower Body Bathing: Minimal assistance Where Assessed-Lower Body Bathing: Edge of bed Upper Body Dressing: Supervision/safety Where Assessed-Upper Body Dressing: Edge of bed Lower Body Dressing: Moderate assistance Where Assessed-Lower Body Dressing: Edge of bed Toileting: Not assessed Toilet Transfer: Minimal assistance Toilet Transfer Method: Ambulating(RW) Tub/Shower Transfer: Not assessed      Therapy/Group: Individual Therapy  Ricki Clack A Tea Collums 05/23/2019, 3:38 PM

## 2019-05-23 NOTE — Care Management (Signed)
Patient Details  Name: Ruben Moore MRN: [id number] Date of Birth: Jun 03, 1945  Today's Date: 05/23/2019  Problem List:  Patient Active Problem List   Diagnosis Date Noted  . 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 Thyroid nodule   . Right hemiparesis ([place])   . Dyslipidemia   . Acute ischemic stroke ([place]) 05/17/2019  . Hyperkalemia 05/17/2019  . Diabetes mellitus type II, non insulin dependent ([place]) 05/17/2019  . Bladder cancer ([place])   . Tobacco use    Past Medical History:  Past Medical History:  Diagnosis Date  . Bladder cancer ([place])   . Diabetes mellitus without complication ([place])   . Stroke ([place]) 05/16/2019  . Tobacco use    Past Surgical History: History reviewed. No pertinent surgical history. Social History:  reports that he has quit smoking. He has quit using smokeless tobacco. He reports previous alcohol use. He reports current drug use. Drug: Marijuana.  Family / Support Systems Children: Daughters Anticipated Caregiver: Daughter; Rylee Toole Caregiver Availability: 24/7  Social History Preferred language: English Religion:  Read: Yes Write: Yes Employment Status: Retired   Abuse/Neglect Abuse/Neglect Assessment Can Be Completed: Yes Physical Abuse: Denies Verbal Abuse: Denies Sexual Abuse: Denies Exploitation of patient/patient's resources: Denies Self-Neglect: Denies  Emotional Status Pt's affect, behavior and adjustment status: Normal mood, flat affect  Patient / Family Perceptions, Expectations & Goals Pt/Family understanding of illness & functional limitations: Patient appears to have a fair understanding of current health issues and functional limitations; has aphasia, and visual deficits. Reports not so good with the entire picture due his memory issues and events/timing are "fuzzy" since he fell after the stroke. Daughter appears to have a good understanding of functional limitations after the stroke. Premorbid pt/family  roles/activities: Patient was independent with BADLs and lived alone prior to admissions Anticipated changes in roles/activities/participation: Patient will need assistance/supervision from daughters after discharge Pt/family expectations/goals: Would like for the patient to be as independent as possible. The patient would like to be able to live alone again and do for himself.  Occupational psychologist available at discharge: Daughters will provide transportation at discharge  Discharge Planning Living Arrangements: Children Support Systems: Children Type of Residence: Private residence Does the patient have any problems obtaining your medications?: No Home Management: Daughters will assist with home management at discharge Patient/Family Preliminary Plans: Return to his home with daughters coming in to assist/supervise as needed Social Work Anticipated Follow Up Needs: [place] Additional Notes/Comments: Daugter Solmon Ice noted she and her sister will be available as needed to assist Expected length of stay: 10-12 days  Clinical Impression Personable gentleman who appears to be a very private person PTA. Reported he is doing as well as can be expected after the stroke however he does not remember much after falling. (Events/timing are fuzzy since the stroke) Remembers coming to after falling and calling for help however does not recall timeframe he was down. Reported he lived alone and did not speak to his daughter regularly so no one missed him or called to check on him for some time. Noted he would like to get back to the independent status if possible but is accepting of assistance/supervision from daughters at discharge to get him back on his feet. The patient denied health issues prior to admission, noted he drove, managed the home and took care of himself independently. Daughter not aware of medications; she was able to look in his med cabinet and found medications for HLD  and  GERD, Flonase and an inhaler (hx of asthma). The patient reported he had a SPC in the car.  Dorien Chihuahua B 05/23/2019, 3:22 PM

## 2019-05-24 ENCOUNTER — Inpatient Hospital Stay (HOSPITAL_COMMUNITY): Payer: Non-veteran care | Admitting: Speech Pathology

## 2019-05-24 ENCOUNTER — Inpatient Hospital Stay (HOSPITAL_COMMUNITY): Payer: No Typology Code available for payment source

## 2019-05-24 ENCOUNTER — Inpatient Hospital Stay (HOSPITAL_COMMUNITY): Payer: Non-veteran care | Admitting: Physical Therapy

## 2019-05-24 ENCOUNTER — Inpatient Hospital Stay (HOSPITAL_COMMUNITY): Payer: Non-veteran care

## 2019-05-24 LAB — GLUCOSE, CAPILLARY
Glucose-Capillary: 94 mg/dL (ref 70–99)
Glucose-Capillary: 126 mg/dL — ABNORMAL HIGH (ref 70–99)
Glucose-Capillary: 116 mg/dL — ABNORMAL HIGH (ref 70–99)
Glucose-Capillary: 103 mg/dL — ABNORMAL HIGH (ref 70–99)

## 2019-05-24 NOTE — Progress Notes (Addendum)
[place] PHYSICAL MEDICINE & REHABILITATION PROGRESS NOTE   Subjective/Complaints:  Pt states he did well intherapy, slept well last noc.  Has had intermittent left sided chest/rib pain, denies fall during CVA onset   ROS- denies abd pain, , SOB  Objective:   No results found. Recent Labs    05/23/19 0611  WBC 11.6*  HGB 13.8  HCT 43.4  PLT 336   Recent Labs    05/23/19 0611  NA 137  K 4.4  CL 99  CO2 26  GLUCOSE 106*  BUN 17  CREATININE 1.26*  CALCIUM 9.4    Intake/Output Summary (Last 24 hours) at 05/24/2019 0817 Last data filed at 05/24/2019 0524 Gross per 24 hour  Intake 240 ml  Output 125 ml  Net 115 ml     Physical Exam: Vital Signs Blood pressure 132/73, pulse 77, temperature 98.1 F (36.7 C), temperature source Oral, resp. rate 18, height 6\' 3"  (1.905 m), weight 73.5 kg, SpO2 96 %. Vitals reviewed. Constitutional: He is oriented to person, place, and time. He appears well-developed and well-nourished.  HENT:  Head: Normocephalic and atraumatic.  Eyes: EOM are normal. Right eye exhibits no discharge. Left eye exhibits no discharge.  Neck: No tracheal deviation present. No thyromegaly present.  Respiratory: Effort normal. No stridor. No respiratory distress. Lungs clear Heart- RRR no ubs murmur or ES  GI: Soft. He exhibits no distension.  Musculoskeletal:     Comments: No edema or tenderness in extremities No rib tenderness on the Left side  Neurological: He is alert and oriented to person, place, and time.  Patient is alert in no acute distress.   Follows commands and makes good eye contact with examiner.   Dysarthria, word finding difficulties.  Fair awareness of his deficits. Right facial weakness Motor: Right upper extremity: 3+/5 proximal distal Right lower extremity: 4-4+/5 proximal distal- pt requires some cues to activate Left upper extremity/left lower extremity: 5/5 proximal distal Expressive aphasia. Difficulty with naming tasks.  Skin:  Skin is warm and dry.  Psychiatric: He has a normal mood and affect. His behavior is normal.     Assessment/Plan: 1. 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 which require 3+ hours per day of interdisciplinary therapy in a comprehensive inpatient rehab setting.  Physiatrist is providing close team supervision and 24 hour management of active medical problems listed below.  Physiatrist and rehab team continue to assess barriers to discharge/monitor patient progress toward functional and medical goals  Care Tool:  Bathing    Body parts bathed by patient: Right arm, Left arm, Chest, Abdomen, Front perineal area, Buttocks, Right upper leg, Left upper leg, Right lower leg, Left lower leg, Face         Bathing assist Assist Level: Minimal Assistance - Patient > 75%     Upper Body Dressing/Undressing Upper body dressing   What is the patient wearing?: Pull over shirt    Upper body assist Assist Level: Supervision/Verbal cueing    Lower Body Dressing/Undressing Lower body dressing      What is the patient wearing?: Incontinence brief     Lower body assist Assist for lower body dressing: Maximal Assistance - Patient 25 - 49%     Toileting Toileting    Toileting assist Assist for toileting: Moderate Assistance - Patient 50 - 74%     Transfers Chair/bed transfer  Transfers assist     Chair/bed transfer assist level: Minimal Assistance - Patient > 75%     Locomotion Ambulation  Ambulation assist      Assist level: Moderate Assistance - Patient 50 - 74% Assistive device: Hand held assist Max distance: 40   Walk 10 feet activity   Assist     Assist level: Moderate Assistance - Patient - 50 - 74% Assistive device: Hand held assist   Walk 50 feet activity   Assist Walk 50 feet with 2 turns activity did not occur: Safety/medical concerns         Walk 150 feet activity   Assist Walk 150 feet activity did not occur: Safety/medical  concerns         Walk 10 feet on uneven surface  activity   Assist Walk 10 feet on uneven surfaces activity did not occur: Safety/medical concerns         Wheelchair     Assist   Type of Wheelchair: Manual    Wheelchair assist level: Moderate Assistance - Patient 50 - 74% Max wheelchair distance: 75    Wheelchair 50 feet with 2 turns activity    Assist        Assist Level: Moderate Assistance - Patient 50 - 74%   Wheelchair 150 feet activity     Assist      Assist Level: Maximal Assistance - Patient 25 - 49%   Blood pressure 132/73, pulse 77, temperature 98.1 F (36.7 C), temperature source Oral, resp. rate 18, height 6\' 3"  (1.905 m), weight 73.5 kg, SpO2 96 %.  Medical Problem List and Plan: 1.  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       CIR PT, OT, SLP  2.  Antithrombotics: -DVT/anticoagulation: Lovenox             -antiplatelet therapy: Aspirin 325 mg daily and Plavix 75 mg daily x3 months and aspirin alone 3. Pain Management: Tylenol as needed  Intermittent Left sided chest/rib pain, pain increased with taking a deep breath, pt is in no distress appears comfortable, no exacerbation while in therapy , check CXR and EKG , monitor- exam is unremarkable   Normal ECHO 1/26 EKG    Borderline ST elevation in lateral leads- repeat unchanged 4. Mood: Provide emotional support             -antipsychotic agents: N/A 5. Neuropsych: This patient is ?fully capable of making decisions on his own behalf. 6. Skin/Wound Care: Routine skin checks 7. Fluids/Electrolytes/Nutrition: Routine in and outs.  8.  History of tobacco abuse as well as marijuana.  Provide counseling 9.  Hyperlipidemia.  Cont Lipitor.  10.  Diabetes mellitus.  Hemoglobin A1c 5.4.  SSI.  Patient on no medications prior to admission             Monitor with increased  mobility.  11.  3.3 cm right thyroid nodule.  Follow-up as outpatient with ultrasound 12. Bradycardia:  Vitals:   05/23/19 2043 05/24/19 0444  BP: (!) 120/55 132/73  Pulse: 68 77  Resp: 16 18  Temp: (!) 97.5 F (36.4 C) 98.1 F (36.7 C)  SpO2: 100% 96%    13.  Constipation increased Senna S  to 2 po BID   LOS: 4 days A FACE TO FACE EVALUATION WAS PERFORMED  Charlett Blake 05/24/2019, 8:17 AM

## 2019-05-24 NOTE — Progress Notes (Signed)
Speech Language Pathology Daily Session Note  Patient Details  Name: Ruben Moore MRN: [id number] Date of Birth: [birth year]  Today's Date: 05/24/2019 SLP Individual Time: [id number] SLP Individual Time Calculation (min): 29 min  Short Term Goals: Week 1: SLP Short Term Goal 1 (Week 1): Pt will utilize word finding strategies to create list of 10 words within specified category with Min A cues. SLP Short Term Goal 2 (Week 1): Pt will utilize word finding strategies to convey basic information with Min A cues. SLP Short Term Goal 3 (Week 1): Pt will self-monitor and self-correct word finding errors in 7 out of 10 opportunities with Mod A cues. SLP Short Term Goal 4 (Week 1): Pt will implement speech intelligibility strategies to increase speech intelligiblity at simple conversation to >90% intelligibility and Min A cues.  Skilled Therapeutic Interventions: Pt was seen for skilled ST targeting communication goals. Pt able to verbally recall 1 (slow rate) of 2 speech intelligibility strategies with Supervision A questions cues (needed assistance recalling overarticulation). Pt required overall Mod A semantic cues and SLP's modeling of word-finding strategies to generate names of 10 items within a category such as fruits and vegetables (divergent naming task). Pt's speech was ~80% intelligible throughout session. During session pt successfully communicated urinary urgency and utilized urinal with set up assistance. Mod A question cues required for pt to communicate if/when he was finished voiding and to request SLP empty urinal. Pt left sitting in chair with seatbelt alarm in place and needs within reach. Continue per current plan of care.       Pain Pain Assessment Pain Scale: 0-10 Pain Score: 0-No pain  Therapy/Group: Individual Therapy  Arbutus Leas 05/24/2019, 9:46 AM

## 2019-05-24 NOTE — Progress Notes (Signed)
Occupational Therapy Session Note  Patient Details  Name: Ruben Moore MRN: [id number] Date of Birth: 09/29/1945  Today's Date: 05/24/2019 OT Individual Time: 0700-0800 OT Individual Time Calculation (min): 60 min    Short Term Goals: Week 1:  OT Short Term Goal 1 (Week 1): Pt will complete 1/3 components of toileting with Min balance assist OT Short Term Goal 2 (Week 1): Pt will complete shower transfer with Min A and LRAD OT Short Term Goal 3 (Week 1): Pt will complete 2 grooming tasks while standing at the sink to improve standing endurance  Skilled Therapeutic Interventions/Progress Updates:    pt received sleeping, easily awoken and agreeable to session. Pt polite but with flat affect throughout session. Pt required cueing throughout session for full completion of tasks- I.e. putting tops back on containers and being thorough with grooming and self care tasks. Pt completed bed mobility with min cueing for attention to the R, CGA. Pt used RW with min cueing for UE placement to complete sit > stand with min A. Functional mobility into bathroom with RW, min A overall. Pt transferred onto TTB in walk in shower. Incontinence brief had urine in it. Pt completed UB bathing with min cueing for inclusion of RUE, no physical assist needed. Min A for standing balance assist for washing peri areas. Cueing for RUE to hold onto grab bar. Pt dried off and transferred to w/c at the sink. Min cueing for completion of oral care and grooming tasks. Pt donned shirt with CGA, min A to don pants. Min cueing for hemi technique for both. Pt able to don ted hose bilaterally with min cueing for technique and use of figure 4 position. Pt set up to eat breakfast seated in w/c. Assistance provided with container management. Chair alarm belt fastened and all needs met.    Therapy Documentation Precautions:  Precautions Precautions: Fall Precaution Comments: Rt hemiplegia Restrictions Weight Bearing Restrictions:  No   Therapy/Group: Individual Therapy  Sandra H Davis 05/24/2019, 6:36 AM  

## 2019-05-24 NOTE — Progress Notes (Signed)
Physical Therapy Session Note  Patient Details  Name: Ruben Moore MRN: [id number] Date of Birth: [birth year]  Today's Date: 05/24/2019 PT Individual Time: 1303-1420 PT Individual Time Calculation (min): 77 min   Short Term Goals: Week 1:  PT Short Term Goal 1 (Week 1): Pt will perform stand pivot transfers with CGA and LRAD PT Short Term Goal 2 (Week 1): Pt will ambulate 130f with CGA and LRAD PT Short Term Goal 3 (Week 1): Pt will propell WC x 1010fwith supervision assist PT Short Term Goal 4 (Week 1): Pt will ascend 4 steps with CGA and BUE support  Skilled Therapeutic Interventions/Progress Updates: Pt presented in bed agreeable to therapy. Pt denies pain during session. Performed bed mobility with supervision and use of features. Performed stand pivot transfer with CGA. Pt propelled to rehab gym with minA due to overpowering use of LUE causing frequent R drift. Pt cued to increase extension of RUE to facilitate propulsion however poor carryover. Performed stand pivot to mat minA with RW. Performed STS for BLE strengthening and feedback to minimize genu recurvatum. Pt then performed toe taps to target (dot on floor) with LLE while attempting to stabilize R knee. Pt was able to maintain "soft knee" for 10/15 taps. Pt then participated in opening/closing pill bottles with BUE and no AD while pt attempting to avoid GR in standing. Pt was able to maintain with min cues until becoming fatigued and noted increased flexion in RLE. Pt completed task in seated with cues for increased use of R hand (to hold bottle). Pt also performed pipe tree in sitting for R NMR with pt requiring cues for holding pipe bin with R hand and cues for using correct pieces. Pt was able to correctly pick out pice without assist approximately 15% of time. Remaining session focused on R NMR via forced use. Pt transferred to supine CGA and participated in the following SLR, hamstring pulls with physioball maintaining neutral  knee, bridges, SL bridges all performed 2 x 10 with RLE. Pt returned to sitting EOM close S and ambulated back to room with minA to stabilize knee. Pt was able to prevent GR approx 50% of time. Pt also required cues for increasing BOS. Upon return to room pt requesting to return to bed. Pt transferred to supine supervision and was able to boost [facility]. Pt left in bed at end of session with call bell within reach, bed alarm on, and needs met.       Therapy Documentation Precautions:  Precautions Precautions: Fall Precaution Comments: Rt hemiplegia Restrictions Weight Bearing Restrictions: No General:   Vital Signs: Therapy Vitals Temp: 98.2 F (36.8 C) Pulse Rate: 65 Resp: 16 BP: 108/61 Patient Position (if appropriate): Lying Oxygen Therapy SpO2: 100 % O2 Device: Room Air  Therapy/Group: Individual Therapy  Jahara Dail  Davianna Deutschman, PTA  05/24/2019, 3:39 PM

## 2019-05-24 NOTE — Plan of Care (Signed)
  Problem: RH BLADDER ELIMINATION Goal: RH STG MANAGE BLADDER WITH ASSISTANCE Description: STG Manage Bladder With min Assistance Outcome: Progressing Goal: RH STG MANAGE BLADDER WITH EQUIPMENT WITH ASSISTANCE Description: STG Manage Bladder With Equipment With Assistance Outcome: Progressing   Problem: RH SKIN INTEGRITY Goal: RH STG SKIN FREE OF INFECTION/BREAKDOWN Description: Skin remains free of breakdown and infection - mod I Outcome: Progressing Goal: RH STG MAINTAIN SKIN INTEGRITY WITH ASSISTANCE Description: STG Maintain Skin Integrity With Assistance. Mod I Outcome: Progressing   Problem: RH SAFETY Goal: RH STG ADHERE TO SAFETY PRECAUTIONS W/ASSISTANCE/DEVICE Description: STG Adhere to Safety Precautions With Assistance/Device. Mod I Outcome: Progressing Goal: RH STG DECREASED RISK OF FALL WITH ASSISTANCE Description: STG Decreased Risk of Fall With Assistance. Mod I Outcome: Progressing   Problem: RH PAIN MANAGEMENT Goal: RH STG PAIN MANAGED AT OR BELOW PT'S PAIN GOAL Description: No pain or less than 3 Outcome: Progressing

## 2019-05-25 ENCOUNTER — Inpatient Hospital Stay (HOSPITAL_COMMUNITY): Payer: Non-veteran care | Admitting: Physical Therapy

## 2019-05-25 ENCOUNTER — Inpatient Hospital Stay (HOSPITAL_COMMUNITY): Payer: Non-veteran care | Admitting: Speech Pathology

## 2019-05-25 ENCOUNTER — Inpatient Hospital Stay (HOSPITAL_COMMUNITY): Payer: Non-veteran care | Admitting: Occupational Therapy

## 2019-05-25 LAB — GLUCOSE, CAPILLARY
Glucose-Capillary: 96 mg/dL (ref 70–99)
Glucose-Capillary: 108 mg/dL — ABNORMAL HIGH (ref 70–99)
Glucose-Capillary: 99 mg/dL (ref 70–99)
Glucose-Capillary: 89 mg/dL (ref 70–99)

## 2019-05-25 NOTE — Progress Notes (Addendum)
[place] PHYSICAL MEDICINE & REHABILITATION PROGRESS NOTE   Subjective/Complaints:  Left sided chest pain, mainly in lower left armpit area , does not recall any trauma to area   ROS- denies abd pain, , SOB  Objective:   DG Chest 2 View  Result Date: 05/24/2019 CLINICAL DATA:  Fall 10 days ago, left rib pain EXAM: CHEST - 2 VIEW COMPARISON:  05/16/2019 FINDINGS: The heart size and mediastinal contours are within normal limits. Both lungs are clear. The visualized skeletal structures are unremarkable. IMPRESSION: No acute abnormality of the lungs. No displaced fractures or other radiographic abnormality of the left chest to explain pain. Dedicated rib radiographs or CT may be more sensitive to assess for subtle fractures. Electronically Signed   By: Eddie Candle M.D.   On: 05/24/2019 10:08   Recent Labs    05/23/19 0611  WBC 11.6*  HGB 13.8  HCT 43.4  PLT 336   Recent Labs    05/23/19 0611  NA 137  K 4.4  CL 99  CO2 26  GLUCOSE 106*  BUN 17  CREATININE 1.26*  CALCIUM 9.4    Intake/Output Summary (Last 24 hours) at 05/25/2019 0844 Last data filed at 05/25/2019 0132 Gross per 24 hour  Intake 240 ml  Output 375 ml  Net -135 ml     Physical Exam: Vital Signs Blood pressure 114/66, pulse 70, temperature 97.6 F (36.4 C), temperature source Oral, resp. rate 18, height _0  (1.905 m), weight 73.5 kg, SpO2 99 %. Vitals reviewed. Constitutional: He is oriented to person, place, and time. He appears well-developed and well-nourished.  HENT:  Head: Normocephalic and atraumatic.  Eyes: EOM are normal. Right eye exhibits no discharge. Left eye exhibits no discharge.  Neck: No tracheal deviation present. No thyromegaly present.  Respiratory: Effort normal. No stridor. No respiratory distress. Lungs clear Heart- RRR no ubs murmur or ES  GI: Soft. He exhibits no distension.  Musculoskeletal:     Comments: there is left lateral rib tendrness in mid axillary line just inferior to  axilla   Neurological: He is alert and oriented to person, place, and time.  Patient is alert in no acute distress.   Follows commands and makes good eye contact with examiner.   Dysarthria, word finding difficulties.  Fair awareness of his deficits. Right facial weakness Motor: Right upper extremity: 3+/5 proximal distal Right lower extremity: 4-4+/5 proximal distal- pt requires some cues to activate Left upper extremity/left lower extremity: 5/5 proximal distal Expressive aphasia. Difficulty with naming tasks.  Skin: Skin is warm and dry.  Psychiatric: He has a normal mood and affect. His behavior is normal.     Assessment/Plan: 1. 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 which require 3+ hours per day of interdisciplinary therapy in a comprehensive inpatient rehab setting.  Physiatrist is providing close team supervision and 24 hour management of active medical problems listed below.  Physiatrist and rehab team continue to assess barriers to discharge/monitor patient progress toward functional and medical goals  Care Tool:  Bathing    Body parts bathed by patient: Right arm, Left arm, Chest, Abdomen, Front perineal area, Buttocks, Right upper leg, Left upper leg, Right lower leg, Left lower leg, Face         Bathing assist Assist Level: Minimal Assistance - Patient > 75%     Upper Body Dressing/Undressing Upper body dressing   What is the patient wearing?: Pull over shirt    Upper body assist Assist Level: Supervision/Verbal  cueing    Lower Body Dressing/Undressing Lower body dressing      What is the patient wearing?: Incontinence brief     Lower body assist Assist for lower body dressing: Maximal Assistance - Patient 25 - 49%     Toileting Toileting    Toileting assist Assist for toileting: Moderate Assistance - Patient 50 - 74%     Transfers Chair/bed transfer  Transfers assist     Chair/bed transfer assist level: Minimal Assistance -  Patient > 75%     Locomotion Ambulation   Ambulation assist      Assist level: Moderate Assistance - Patient 50 - 74% Assistive device: Hand held assist Max distance: 40   Walk 10 feet activity   Assist     Assist level: Moderate Assistance - Patient - 50 - 74% Assistive device: Hand held assist   Walk 50 feet activity   Assist Walk 50 feet with 2 turns activity did not occur: Safety/medical concerns         Walk 150 feet activity   Assist Walk 150 feet activity did not occur: Safety/medical concerns         Walk 10 feet on uneven surface  activity   Assist Walk 10 feet on uneven surfaces activity did not occur: Safety/medical concerns         Wheelchair     Assist   Type of Wheelchair: Manual    Wheelchair assist level: Moderate Assistance - Patient 50 - 74% Max wheelchair distance: 75    Wheelchair 50 feet with 2 turns activity    Assist        Assist Level: Moderate Assistance - Patient 50 - 74%   Wheelchair 150 feet activity     Assist      Assist Level: Maximal Assistance - Patient 25 - 49%   Blood pressure 114/66, pulse 70, temperature 97.6 F (36.4 C), temperature source Oral, resp. rate 18, height _0  (1.905 m), weight 73.5 kg, SpO2 99 %.  Medical Problem List and Plan: 1.  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       CIR PT, OT, SLP  Team conference today please see physician documentation under team conference tab, met with team  to discuss problems,progress, and goals. Formulized individual treatment plan based on medical history, underlying problem and comorbidities. 2.  Antithrombotics: -DVT/anticoagulation: Lovenox             -antiplatelet therapy: Aspirin 325 mg daily and Plavix 75 mg daily x3 months and aspirin alone 3. Pain Management: Tylenol as needed  Intermittent Left sided  chest/rib pain, pain increased with taking a deep breath, pt is in no distress appears comfortable, no exacerbation while in therapy , unremarkable  CXR and EKG , monitor- exam is consistent with rib contusion   Normal ECHO 1/26 EKG    Borderline ST elevation in lateral leads-  Unchanged vs prior trace 4. Mood: Provide emotional support              -antipsychotic agents: N/A 5. Neuropsych: This patient is ?fully capable of making decisions on his own behalf. 6. Skin/Wound Care: Routine skin checks 7. Fluids/Electrolytes/Nutrition: Routine in and outs.  8.  History of tobacco abuse as well as marijuana.  Provide counseling 9.  Hyperlipidemia.  Cont Lipitor.  10.  Diabetes mellitus.  Hemoglobin A1c 5.4.  SSI.  Patient on no medications  prior to admission             Monitor with increased mobility.  11.  3.3 cm right thyroid nodule.  Follow-up as outpatient with ultrasound 12. Bradycardia: asymptomatic NSR on EKG 2/2 Vitals:   05/24/19 2026 05/25/19 0511  BP: (!) 130/59 114/66  Pulse: (!) 57 70  Resp: 18 18  Temp: 97.6 F (36.4 C) 97.6 F (36.4 C)  SpO2: 100% 99%    13.  Constipation increased Senna S  to 2 po BID - 2 stools yesterday   LOS: 5 days A FACE TO [place] E Zykeem Bauserman 05/25/2019, 8:44 AM

## 2019-05-25 NOTE — Patient Care Conference (Signed)
Inpatient RehabilitationTeam Conference and Plan of Care Update Date: 05/25/2019   Time: 10:05 AM    Patient Name: Ruben Moore      Medical Record Number: [id number]  Date of Birth: 24-Jul-1945 Sex: Male         Room/Bed: 4W16C/4W16C-01 Payor Info: Payor: MEDICARE / Plan: MEDICARE PART A / Product Type: *No Product type* /    Admit Date/Time:  05/20/2019  4:01 PM  Primary Diagnosis:  Left middle cerebral artery stroke [facility])  Patient Active Problem List   Diagnosis Date Noted  . Left middle cerebral artery stroke ([place]) 05/20/2019  . Thyroid nodule   . Right hemiparesis ([place])   . Dyslipidemia   . Acute ischemic stroke ([place]) 05/17/2019  . Hyperkalemia 05/17/2019  . Diabetes mellitus type II, non insulin dependent ([place]) 05/17/2019  . Bladder cancer ([place])   . Tobacco use     Expected Discharge Date: Expected Discharge Date: 06/03/19  Team Members Present: Physician leading conference: Dr. Alysia Penna Social Worker Present: Lennart Pall, LCSW Nurse Present: Dorien Chihuahua, RN;Other (comment)(Inka Oljato-[place], LPN) Case Manager: Karene Fry, RN PT Present: Barrie Folk, PT OT Present: Darleen Crocker, OT SLP Present: Jettie Booze, CF-SLP PPS Coordinator present : Ileana Ladd, Burna Mortimer, SLP     Current Status/Progress Goal Weekly Team Focus  Bowel/Bladder   continent of B&B with some episodes of incontinence. LBM 2/2  less incontinent episodes  assess q shift and prn   Swallow/Nutrition/ Hydration             ADL's   Min A bathing at shower level, Supervision UB dressing, Mod A LB dressing, Mod A toileting, Min A toilet transfers at ambulatory level using RW  Supervision overall  Rt NMR, functional transfers, activity tolerance, dynamic balance   Mobility   CGA bed mobility, minA STS, minA transfers, gait 68ft with RW minA with narrow BOS and R knee hyperextension.  supervision overall with CGA stairs  R NMR, balance, gait d/c planning   Communication   Min-Mod A word  finding and intelligibility strategies  Supervision A  carryover word finding strategies to communicate basic info as well as carryover overarticulation, slow rate, increased vocal intensity   Safety/Cognition/ Behavioral Observations            Pain   no complaints of pain  pain < or = 3  assess q shift/prn   Skin   abrasion to R shoulder (open to air)  remain free from further skin breakdown or infection  assess q shift/prn    Rehab Goals Patient on target to meet rehab goals: Yes *See Care Plan and progress notes for long and short-term goals.     Barriers to Discharge  Current Status/Progress Possible Resolutions Date Resolved   Nursing                  PT                    OT                  SLP                SW Decreased caregiver support Daughter notes she and her sister will provide 24/7 care as needed            Discharge Planning/Teaching Needs:  Home with daughter coming in to assist/supervise as needed  TBD   Team Discussion: L chest pain, musculoskeletal, BP controlled, constipation better.  RN BS 96.  OT min transfers, S UB ADL, Mod A LB A/D, goals S.  PT CGA/min A transfers and gait, CGA/min bed, goals S.  SLP mod Aword finding, strategies, min/mod A intelligibility, goals S.  Lives alone, 2 Dtrs to assist as needed, both are local.   Revisions to Treatment Plan: N/A     Medical Summary Current Status: Left-sided musculoskeletal chest pain in the rib area laterally.  Patient tolerating therapy well.  Bowels are starting to move better Weekly Focus/Goal: Increase therapy tolerance and endurance  Barriers to Discharge: Medical stability;Decreased family/caregiver support  Barriers to Discharge Comments: Patient was quite independent before, daughters have not had to help him at home at all Possible Resolutions to Barriers: We will try to upgrade discharge goals to mod I if possible   Continued Need for Acute Rehabilitation Level of Care: The patient  requires daily medical management by a physician with specialized training in physical medicine and rehabilitation for the following reasons: Direction of a multidisciplinary physical rehabilitation program to maximize functional independence : Yes Medical management of patient stability for increased activity during participation in an intensive rehabilitation regime.: Yes Analysis of laboratory values and/or radiology reports with any subsequent need for medication adjustment and/or medical intervention. : Yes   I attest that I was present, lead the team conference, and concur with the assessment and plan of the team.   Jodell Cipro M 05/25/2019, 3:29 PM   Team conference was held via web/ teleconference due to [place] - 19

## 2019-05-25 NOTE — Progress Notes (Signed)
Occupational Therapy Session Note  Patient Details  Name: Ruben Moore MRN: [id number] Date of Birth: 06-30-1945  Today's Date: 05/25/2019 OT Individual Time: 1447-1600 OT Individual Time Calculation (min): 73 min    Short Term Goals: Week 1:  OT Short Term Goal 1 (Week 1): Pt will complete 1/3 components of toileting with Min balance assist OT Short Term Goal 2 (Week 1): Pt will complete shower transfer with Min A and LRAD OT Short Term Goal 3 (Week 1): Pt will complete 2 grooming tasks while standing at the sink to improve standing endurance  Skilled Therapeutic Interventions/Progress Updates:    Upon entering the room, pt supine in bed with no c/o pain and agreeable to OT intervention. Supine >sit with min guard and standing with min A overall. Pt declined shower and toileting. Pt ambulating 100' with RW into day room with min guard. Pt engaged in standing task while working on [place]. Pt standing for 15 minutes and 10 minutes respectively. Pt engaged in card tasks with moderate difficulty. Pt requiring increased time and multiple attempts for palmar translation tasks and flipping cards over. Pt turning to therapist and stating, " I need to go to the bathroom now". When asked if he could walk back to room he said yes but then becoming incontinent in brief. Pt ambulating back to room with min A for safety and cuing secondary to being upset from incontinence. Pt seated on commode and having multiple bouts of diarrhea. Pt needing assistance with hygiene and changed LB clothing with mod A this session and min cuing for hemiplegic technique. Pt transferred to wheelchair with min guard stand pivot with RW. Chair alarm belt donned and call bell within reach.   Therapy Documentation Precautions:  Precautions Precautions: Fall Precaution Comments: Rt hemiplegia Restrictions Weight Bearing Restrictions: No Vital Signs: Therapy Vitals Temp: 98 F (36.7 C) Pulse Rate: 64 Resp: 18 BP: (!)  116/58 Patient Position (if appropriate): Lying Oxygen Therapy SpO2: 99 % O2 Device: Room Air ADL: ADL Eating: Not assessed Grooming: Moderate assistance Where Assessed-Grooming: Chair Upper Body Bathing: Supervision/safety Where Assessed-Upper Body Bathing: Edge of bed Lower Body Bathing: Minimal assistance Where Assessed-Lower Body Bathing: Edge of bed Upper Body Dressing: Supervision/safety Where Assessed-Upper Body Dressing: Edge of bed Lower Body Dressing: Moderate assistance Where Assessed-Lower Body Dressing: Edge of bed Toileting: Not assessed Toilet Transfer: Minimal assistance Toilet Transfer Method: Ambulating(RW) Tub/Shower Transfer: Not assessed   Therapy/Group: Individual Therapy  [name] 05/25/2019, 4:36 PM

## 2019-05-25 NOTE — Progress Notes (Signed)
Physical Therapy Session Note  Patient Details  Name: Ruben Moore MRN: [id number] Date of Birth: [birth year]  Today's Date: 05/25/2019 PT Individual Time: 0800-0856 AND 1135-1200 PT Individual Time Calculation (min): 56 min and 25 min   Short Term Goals: Week 1:  PT Short Term Goal 1 (Week 1): Pt will perform stand pivot transfers with CGA and LRAD PT Short Term Goal 2 (Week 1): Pt will ambulate 137f with CGA and LRAD PT Short Term Goal 3 (Week 1): Pt will propell WC x 10109fwith supervision assist PT Short Term Goal 4 (Week 1): Pt will ascend 4 steps with CGA and BUE support  Skilled Therapeutic Interventions/Progress Updates:  Session 1  Pt received supine in bed and agreeable to PT. PT assisted pt to don ted hose and socks for time management  Supine>sit transfer with supervision assist and min use of rail on the L. Stand pivot [facility] and CGA on the R.   WC mobility x 15065fith min assist to force improved awareness of and coordination in the RUE. Min cues through for improved ROM and grasp on the RUE.   Gait training with RW x [id number] with CGA-min assist from PT and moderate cues to prevent GR and sustain "soft knee" on the R, and increase step width on the R with fatigue..  Variable gait training in parallel bars forward/reverse 3x 10ft50f 2 bouts and side stepping 3 x 10ft 71ffor 2 bouts. Min assist throughout and moderate cues for posture, step length, and improved knee control on the R to prevent GR.    Nustep reciprocal movement training x 8 min total with multiple therapeutic rest breaks, level 3>5. Cues from PT for improved trunk rotation to improve symmetry of movement and protect R shoulder girdle as well as decreased speed for improved cardiovascular endurance training.   Patient returned to room and left sitting in recliner with call bell in reach and all needs met.    Session 2  Pt received sitting in WC and agreeable to PT. Pt notes blood at  injection site in stomach from RN administering medication this AM. PT applied gauze pad and RN made aware.   Pt transported to rehab gym in WC. [facility] training to weave through 10 cones x 2 with RW. Min assist overall with moderate cues for improved posture, wide BOS, and to prevent GR.   Gait training through hall to room with RW and CGA x 150ft. 44fcues for increased step length on the R with fatigue as well as improved attention to the R knee to prevent GR intermittently.   Pt returned to room and performed ambulatory transfer to bed with CGA and RW. Sit>supine completed with supervision assist, and left supine in bed with call bell in reach and all needs met.        Therapy Documentation Precautions:  Precautions Precautions: Fall Precaution Comments: Rt hemiplegia Restrictions Weight Bearing Restrictions: No Vital Signs: Therapy Vitals Temp: 97.6 F (36.4 C) Temp Source: Oral Pulse Rate: 70 Resp: 18 BP: 114/66 Patient Position (if appropriate): Lying Oxygen Therapy SpO2: 99 % O2 Device: Room Air Pain: denies   Therapy/Group: Individual Therapy  Ruben Moore Lorie Phenix21, 9:00 AM

## 2019-05-25 NOTE — Plan of Care (Signed)
  Problem: RH BOWEL ELIMINATION Goal: RH STG MANAGE BOWEL WITH ASSISTANCE Description: STG Manage Bowel with Assistance. Outcome: Progressing Goal: RH STG MANAGE BOWEL W/MEDICATION W/ASSISTANCE Description: STG Manage Bowel with Medication with Assistance. Min Outcome: Progressing   Problem: RH BLADDER ELIMINATION Goal: RH STG MANAGE BLADDER WITH ASSISTANCE Description: STG Manage Bladder With min Assistance Outcome: Progressing Goal: RH STG MANAGE BLADDER WITH EQUIPMENT WITH ASSISTANCE Description: STG Manage Bladder With Equipment With Assistance Outcome: Progressing   Problem: RH SKIN INTEGRITY Goal: RH STG SKIN FREE OF INFECTION/BREAKDOWN Description: Skin remains free of breakdown and infection - mod I Outcome: Progressing Goal: RH STG MAINTAIN SKIN INTEGRITY WITH ASSISTANCE Description: STG Maintain Skin Integrity With Assistance. Mod I Outcome: Progressing   Problem: RH SAFETY Goal: RH STG ADHERE TO SAFETY PRECAUTIONS W/ASSISTANCE/DEVICE Description: STG Adhere to Safety Precautions With Assistance/Device. Mod I Outcome: Progressing Goal: RH STG DECREASED RISK OF FALL WITH ASSISTANCE Description: STG Decreased Risk of Fall With Assistance. Mod I Outcome: Progressing   Problem: RH PAIN MANAGEMENT Goal: RH STG PAIN MANAGED AT OR BELOW PT'S PAIN GOAL Description: No pain or less than 3 Outcome: Progressing

## 2019-05-25 NOTE — Progress Notes (Signed)
Team Conference Report to Patient/Family  Team Conference discussion was reviewed with the patient and caregiver, including goals, any changes in plan of care and target discharge date.  Patient and caregiver express understanding and are in agreement.  The patient has a target discharge date of 06/03/19. With overall supervision goals. Family education TBD and daughter stated an understanding of information reviewed. Currently min assist for transfers, supervision for UB/mod assist for LB B+D. Not functional ambulatory due to poor knee control/weakness.  Ruben Moore B 05/25/2019, 5:24 PM

## 2019-05-25 NOTE — Progress Notes (Signed)
Speech Language Pathology Daily Session Note  Patient Details  Name: Ruben Moore MRN: [id number] Date of Birth: 05-Oct-1945  Today's Date: 05/25/2019 SLP Individual Time: 0930-1015 SLP Individual Time Calculation (min): 45 min  Short Term Goals: Week 1: SLP Short Term Goal 1 (Week 1): Pt will utilize word finding strategies to create list of 10 words within specified category with Min A cues. SLP Short Term Goal 2 (Week 1): Pt will utilize word finding strategies to convey basic information with Min A cues. SLP Short Term Goal 3 (Week 1): Pt will self-monitor and self-correct word finding errors in 7 out of 10 opportunities with Mod A cues. SLP Short Term Goal 4 (Week 1): Pt will implement speech intelligibility strategies to increase speech intelligiblity at simple conversation to >90% intelligibility and Min A cues.  Skilled Therapeutic Interventions:  Skilled treatment session targeted expressive communication. SLP facilitiated session by providing category and having pt verbalize and write down items within specified category. Pt able to list 7 colors and 5 fruits with improved self-monitoring and correction for perseverative responses. Pt continues to require Min A semantic cues (description) to generate list. SLP further facilitated expression by presenting written object descriptions. Pt able to read at phrase level with phonemic errors and only intermittent awareness of these errors. He was able to name common object with 75% accuracy. Further pt asked to describe function of previously named objects such as "what do you do with a belt?" Pt with vague and halting responses such as "you put it on" for "belt." With Mod A questions cues and sentence completion, pt able to provide specific description of common objects (p. 89 ). Pt able to self-monitor and prevent perseverative responses. After delay, pt able to state specific description once more. Pt left with same pages to complete for task  during his day. Pt left in recliner, lap belt alarm on and all needs within reach. Continue per current plan of care.      Pain    Therapy/Group: Individual Therapy  Maze Corniel 05/25/2019, 11:03 AM

## 2019-05-26 ENCOUNTER — Inpatient Hospital Stay (HOSPITAL_COMMUNITY): Payer: Non-veteran care | Admitting: Speech Pathology

## 2019-05-26 ENCOUNTER — Inpatient Hospital Stay (HOSPITAL_COMMUNITY): Payer: Non-veteran care | Admitting: Occupational Therapy

## 2019-05-26 ENCOUNTER — Inpatient Hospital Stay (HOSPITAL_COMMUNITY): Payer: Non-veteran care | Admitting: *Deleted

## 2019-05-26 ENCOUNTER — Inpatient Hospital Stay (HOSPITAL_COMMUNITY): Payer: Non-veteran care | Admitting: Physical Therapy

## 2019-05-26 LAB — GLUCOSE, CAPILLARY
Glucose-Capillary: 94 mg/dL (ref 70–99)
Glucose-Capillary: 112 mg/dL — ABNORMAL HIGH (ref 70–99)
Glucose-Capillary: 91 mg/dL (ref 70–99)
Glucose-Capillary: 99 mg/dL (ref 70–99)

## 2019-05-26 NOTE — Progress Notes (Signed)
PHYSICAL MEDICINE & REHABILITATION PROGRESS NOTE   Subjective/Complaints: Ruben Moore shows me an area of bleeding on his right lower abdomen. Denies pain. With minimal bleeding at this time. Denies any constipation, pain. Sleeping well at night.   ROS- denies abd pain, , SOB  Objective:   DG Chest 2 View  Result Date: 05/24/2019 CLINICAL DATA:  Fall 10 days ago, left rib pain EXAM: CHEST - 2 VIEW COMPARISON:  05/16/2019 FINDINGS: The heart size and mediastinal contours are within normal limits. Both lungs are clear. The visualized skeletal structures are unremarkable. IMPRESSION: No acute abnormality of the lungs. No displaced fractures or other radiographic abnormality of the left chest to explain pain. Dedicated rib radiographs or CT may be more sensitive to assess for subtle fractures. Electronically Signed   By: Ruben Moore M.D.   On: 05/24/2019 10:08   No results for input(s): WBC, HGB, HCT, PLT in the last 72 hours. No results for input(s): NA, K, CL, CO2, GLUCOSE, BUN, CREATININE, CALCIUM in the last 72 hours.  Intake/Output Summary (Last 24 hours) at 05/26/2019 0930 Last data filed at 05/26/2019 0811 Gross per 24 hour  Intake 720 ml  Output 500 ml  Net 220 ml     Physical Exam: Vital Signs Blood pressure 120/68, pulse 68, temperature 98 F (36.7 C), resp. rate 18, height 6\' 3"  (1.905 m), weight 73.5 kg, SpO2 98 %. Vitals reviewed. Constitutional: He is oriented to person, place, and time. He appears well-developed and well-nourished.  HENT:  Head: Normocephalic and atraumatic.  Eyes: EOM are normal. Right eye exhibits no discharge. Left eye exhibits no discharge.  Neck: No tracheal deviation present. No thyromegaly present.  Respiratory: Effort normal. No stridor. No respiratory distress. Lungs clear Heart- RRR no ubs murmur or ES  GI: Soft. He exhibits no distension.  Musculoskeletal:     Comments: there is left lateral rib tendrness in mid axillary line just  inferior to axilla   Neurological: He is alert and oriented to person, place, and time.  Patient is alert in no acute distress.   Follows commands and makes good eye contact with examiner.   Dysarthria, word finding difficulties.  Fair awareness of his deficits. Right facial weakness Motor: Right upper extremity: 3+/5 proximal distal Right lower extremity: 4-4+/5 proximal distal- pt requires some cues to activate Left upper extremity/left lower extremity: 5/5 proximal distal Expressive aphasia. Difficulty with naming tasks.  Skin: Skin is warm and dry. Bleeding from small area in right lower abdomen; dressing applied. Minimal drainage at this time.  Psychiatric: He has a normal mood and affect. His behavior is normal.     Assessment/Plan: 1. 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 which require 3+ hours per day of interdisciplinary therapy in a comprehensive inpatient rehab setting.  Physiatrist is providing close team supervision and 24 hour management of active medical problems listed below.  Physiatrist and rehab team continue to assess barriers to discharge/monitor patient progress toward functional and medical goals  Care Tool:  Bathing    Body parts bathed by patient: Right arm, Left arm, Chest, Abdomen, Front perineal area, Buttocks, Right upper leg, Left upper leg, Right lower leg, Left lower leg, Face         Bathing assist Assist Level: Minimal Assistance - Patient > 75%     Upper Body Dressing/Undressing Upper body dressing   What is the patient wearing?: Pull over shirt    Upper body assist Assist Level: Supervision/Verbal cueing  Lower Body Dressing/Undressing Lower body dressing      What is the patient wearing?: Incontinence brief     Lower body assist Assist for lower body dressing: Maximal Assistance - Patient 25 - 49%     Toileting Toileting    Toileting assist Assist for toileting: Moderate Assistance - Patient 50 - 74%      Transfers Chair/bed transfer  Transfers assist     Chair/bed transfer assist level: Contact Guard/Touching assist     Locomotion Ambulation   Ambulation assist      Assist level: Contact Guard/Touching assist Assistive device: Walker-rolling Max distance: 180   Walk 10 feet activity   Assist     Assist level: Contact Guard/Touching assist Assistive device: Walker-rolling   Walk 50 feet activity   Assist Walk 50 feet with 2 turns activity did not occur: Safety/medical concerns  Assist level: Contact Guard/Touching assist Assistive device: Walker-rolling    Walk 150 feet activity   Assist Walk 150 feet activity did not occur: Safety/medical concerns  Assist level: Contact Guard/Touching assist Assistive device: Walker-rolling    Walk 10 feet on uneven surface  activity   Assist Walk 10 feet on uneven surfaces activity did not occur: Safety/medical concerns         Wheelchair     Assist   Type of Wheelchair: Manual    Wheelchair assist level: Minimal Assistance - Patient > 75% Max wheelchair distance: 150    Wheelchair 50 feet with 2 turns activity    Assist        Assist Level: Minimal Assistance - Patient > 75%   Wheelchair 150 feet activity     Assist      Assist Level: Minimal Assistance - Patient > 75%   Blood pressure 120/68, pulse 68, temperature 98 F (36.7 C), resp. rate 18, height 6\' 3"  (1.905 m), weight 73.5 kg, SpO2 98 %.  Medical Problem List and Plan: 1.  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       Continue CIR PT, OT, SLP  2.  Antithrombotics: -DVT/anticoagulation: Lovenox             -antiplatelet therapy: Aspirin 325 mg daily and Plavix 75 mg daily x3 months and aspirin alone 3. Pain Management: Tylenol as needed  Intermittent Left sided chest/rib pain, pain increased with taking a  deep breath, pt is in no distress appears comfortable, no exacerbation while in therapy , unremarkable  CXR and EKG , monitor- exam is consistent with rib contusion  Normal ECHO 1/26 EKG    Borderline ST elevation in lateral leads-  Unchanged vs prior trace 4. Mood: Provide emotional support              -antipsychotic agents: N/A 5. Neuropsych: This patient is ?fully capable of making decisions on his own behalf. 6. Skin/Wound Care: Routine skin checks  2/4: Small area of bleeding in right lower abdomen with minimal bleeding on dressing at this time. Not painful to patient. Possibly from Lovenox injection.  7. Fluids/Electrolytes/Nutrition: Routine in and outs.  8.  History of tobacco abuse as well as marijuana.  Provide counseling 9.  Hyperlipidemia.  Cont Lipitor.  10.  Diabetes mellitus.  Hemoglobin A1c 5.4.  SSI.  Patient on no medications prior to admission             Monitor with increased mobility.  11.  3.3  cm right thyroid nodule.  Follow-up as outpatient with ultrasound 12. Bradycardia: asymptomatic NSR on EKG 2/2  2/4: well controlled.  Vitals:   05/25/19 2007 05/26/19 0358  BP: (!) 125/94 120/68  Pulse: 65 68  Resp: 18 18  Temp: 98.3 F (36.8 C) 98 F (36.7 C)  SpO2: 94% 98%    13.  Constipation increased Senna S  to 2 po BID - 2 stools yesterday   2/4: says he is moving his bowels regularly now.   LOS: 6 days A FACE TO FACE EVALUATION WAS PERFORMED  Ruben Moore P Kyshaun Barnette 05/26/2019, 9:30 AM

## 2019-05-26 NOTE — Progress Notes (Signed)
Physical Therapy Session Note  Patient Details  Name: Ruben Moore MRN: [id number] Date of Birth: 04-Oct-1945  Today's Date: 05/26/2019 PT Individual Time: 0802-0902 PT Individual Time Calculation (min): 60 min   Short Term Goals: Week 1:  PT Short Term Goal 1 (Week 1): Pt will perform stand pivot transfers with CGA and LRAD PT Short Term Goal 2 (Week 1): Pt will ambulate 163f with CGA and LRAD PT Short Term Goal 3 (Week 1): Pt will propell WC x 1022fwith supervision assist PT Short Term Goal 4 (Week 1): Pt will ascend 4 steps with CGA and BUE support  Skilled Therapeutic Interventions/Progress Updates:   Pt received sitting in WC and agreeable to PT. Gait training through hall to and from day room with RW and supervision assist-CGA x 12030fmultimodal cues for increased step height and proper step length to prevent GR on the R. PT instructed pt in body weight support treadmill training 4 bouts x 2 min at 0.8 mph; [id number] with 2 short standing rest breaks and 1 prolonged seated rest break. Min assist throughout to improve height height and length on the RLE as well as bunge to improve R side anterior pelvic rotation. Overground gait training x [id number] RW and close supervision assist with noted improvement in pelvic rotation and step height on the R.   Seated NMR: LAQ 2 x 12 with 3 sec hold. Heel slides 2 x 12, hip abduction against manual resistance x 12 BLE and additional 8 on the R to improve AROM into full abduction. Sit<>[facility] 1 tband at knee to activate glute med in stance x 8. min cues throughout from PT for improved eccentric control and attention to the RLE to achieve full ROM.   Patient returned to room and [facility] and all needs met.          Therapy Documentation Precautions:  Precautions Precautions: Fall Precaution Comments: Rt hemiplegia Restrictions Weight Bearing Restrictions: No     Pain: denies  Therapy/Group: Individual Therapy  AustiLorie Phenix2021, 9:07 AM

## 2019-05-26 NOTE — Evaluation (Signed)
Recreational Therapy Assessment and Plan  Patient Details  Name: Ruben Moore MRN: [id number] Date of Birth: Nov 09, 1945 Today's Date: 05/26/2019  Rehab Potential: Good ELOS: discharge 2/12  Assessment Problem List:      Patient Active Problem List   Diagnosis Date Noted  . 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 Thyroid nodule   . Right hemiparesis ([place])   . Dyslipidemia   . Acute ischemic stroke ([place]) 05/17/2019  . Hyperkalemia 05/17/2019  . Diabetes mellitus type II, non insulin dependent ([place]) 05/17/2019  . Bladder cancer ([place])   . Tobacco use     Past Medical History:      Past Medical History:  Diagnosis Date  . Bladder cancer ([place])   . Diabetes mellitus without complication ([place])   . Stroke ([place]) 05/16/2019  . Tobacco use    Past Surgical History: History reviewed. No pertinent surgical history.  Assessment & Plan Clinical Impression: Patient is a  74 year old right-handed male with history of bladder cancer, chronic left carotid occlusion followed at the [facility], diet controlled diabetes mellitus and tobacco abuse. History taken from chart review and patient due to expressive aphasia. Patient on no prescription medications. Patient lives alone independent prior to admission. He is retired. He does have a daughter in the area that works. He presented on 05/17/2019 with right hemiparesis and dysarthria. Noted systolic blood pressure in the 150s. SARS coronavirus negative, potassium 6.0, troponin negative, urine drug screen positive marijuana, alcohol level negative. Cranial CT scan showed left insular cortex, left basal ganglia, corona radiata infarct likely acute to subacute. No midline shift. Patient did not receive TPA. MRI of the brain patchy multifocal acute to early subacute ischemia left MCA territory. 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 CTA of head and neck left ICA occlusion at its  origin. No significant intracranial stenosis. Incidental finding of a 3.3 cm right thyroid nodule recommend thyroid ultrasound as outpatient. Echocardiogram with EF of 65% without emboli.  Patient transferred to CIR on 05/20/2019.   Pt presents with decreased activity tolerance, decreased functional mobility, decreased balance, decreased coordination, decreased vision, decreased awareness, reported feelings of stress due to loss of independence and limited support system.  Plan Min 1 TR session >20 minutes during LOS  Recommendations for other services: None   Discharge Criteria: Patient will be discharged from TR if patient refuses treatment 3 consecutive times without medical reason.  If treatment goals not met, if there is a change in medical status, if patient makes no progress towards goals or if patient is discharged from hospital.  The above assessment, treatment plan, treatment alternatives and goals were discussed and mutually agreed upon: by patient  Ruben Moore 05/26/2019, 12:12 PM

## 2019-05-26 NOTE — Plan of Care (Signed)
  Problem: RH BLADDER ELIMINATION Goal: RH STG MANAGE BLADDER WITH ASSISTANCE Description: STG Manage Bladder With min Assistance Outcome: Not Progressing; incontinence at times

## 2019-05-26 NOTE — Progress Notes (Signed)
Speech Language Pathology Daily Session Note  Patient Details  Name: Ruben Moore MRN: [id number] Date of Birth: 1945/10/02  Today's Date: 05/26/2019 SLP Individual Time: 4854-6270 SLP Individual Time Calculation (min): 58 min  Short Term Goals: Week 1: SLP Short Term Goal 1 (Week 1): Pt will utilize word finding strategies to create list of 10 words within specified category with Min A cues. SLP Short Term Goal 2 (Week 1): Pt will utilize word finding strategies to convey basic information with Min A cues. SLP Short Term Goal 3 (Week 1): Pt will self-monitor and self-correct word finding errors in 7 out of 10 opportunities with Mod A cues. SLP Short Term Goal 4 (Week 1): Pt will implement speech intelligibility strategies to increase speech intelligiblity at simple conversation to >90% intelligibility and Min A cues.  Skilled Therapeutic Interventions: Pt was seen for skilled ST targeting communication goals. Pt had completed homework assigned in yesterdays' session - pt generated names of 4 fruits and 3 types of furniture independently. Semantic and or visual aid (picture) cues provided to complete list of 10 of each. PT competed sentence completion homework page with 75% accuracy. Pt continued to require Mod A semantic function and/or attribute descriptor cues to generate names of animals and sports. 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 He also completed an additional sentence completion worksheet with 12/15 accuracy. Min A verbal cues were required for pt to detect and correct errors within task. More generative naming and sentence completion homework worksheets assigned. Also discussed strategy to use cell phone to look up pictures in order to assist with naming. Pt left sitting in chair with alarm in place and needs met to his satisfaction. Continue per current plan of care.       Pain Pain Assessment Pain Scale:  0-10 Pain Score: 0-No pain  Therapy/Group: Individual Therapy  Arbutus Leas 05/26/2019, 11:02 AM

## 2019-05-26 NOTE — Progress Notes (Signed)
Occupational Therapy Session Note  Patient Details  Name: Ruben Moore MRN: [id number] Date of Birth: [birth year]  Today's Date: 05/26/2019 OT Individual Time: 8288-3374 OT Individual Time Calculation (min): 40 min   Short Term Goals: Week 1:  OT Short Term Goal 1 (Week 1): Pt will complete 1/3 components of toileting with Min balance assist OT Short Term Goal 2 (Week 1): Pt will complete shower transfer with Min A and LRAD OT Short Term Goal 3 (Week 1): Pt will complete 2 grooming tasks while standing at the sink to improve standing endurance  Skilled Therapeutic Interventions/Progress Updates:    Pt greeted sitting in recliner. Pt reported having some abdominal discomfort after lunch and did not feel like getting back up. Pt however, agreeable to work on R fine motor coordination in room. OT issued pt yellow soft thera-putty with beads placed inside. Worked on pinch strength and R finger isolation to isolate beads and pick them up out of putty. Progressed to making ball with theraputty, then rolling putty into a log. Pt then instructed to pinch log. Pt needed guided A with fatigue for normal movement patterns. Pt declined further activity and was left seated in wc with call bell in reach and needs met.   Therapy Documentation Precautions:  Precautions Precautions: Fall Precaution Comments: Rt hemiplegia Restrictions Weight Bearing Restrictions: No General: General OT Amount of Missed Time: 20 Minutes 2/2 fatigue and abdominal discomfort Pain: Pain Assessment Pain Scale: 0-10 Pt reports some abdominal discomfort after lunch. Pt does not give number to rate pain. Repositioned and rest for comfort.    Therapy/Group: Individual Therapy  Valma Cava 05/26/2019, 2:51 PM

## 2019-05-27 ENCOUNTER — Inpatient Hospital Stay (HOSPITAL_COMMUNITY): Payer: Non-veteran care | Admitting: Occupational Therapy

## 2019-05-27 ENCOUNTER — Inpatient Hospital Stay (HOSPITAL_COMMUNITY): Payer: Non-veteran care | Admitting: Physical Therapy

## 2019-05-27 ENCOUNTER — Inpatient Hospital Stay (HOSPITAL_COMMUNITY): Payer: Non-veteran care | Admitting: Speech Pathology

## 2019-05-27 LAB — CREATININE, SERUM
Creatinine, Ser: 1.18 mg/dL (ref 0.61–1.24)
GFR calc Af Amer: >60 mL/min (ref >60)
GFR calc non Af Amer: >60 mL/min (ref >60)

## 2019-05-27 LAB — GLUCOSE, CAPILLARY
Glucose-Capillary: 97 mg/dL (ref 70–99)
Glucose-Capillary: 120 mg/dL — ABNORMAL HIGH (ref 70–99)
Glucose-Capillary: 133 mg/dL — ABNORMAL HIGH (ref 70–99)
Glucose-Capillary: 127 mg/dL — ABNORMAL HIGH (ref 70–99)

## 2019-05-27 MED ORDER — LIDOCAINE 5 % EX PTCH
1.0000 | MEDICATED_PATCH | CUTANEOUS | Status: DC
  Administered 2019-05-27 – 2019-06-03 (×8): 1 via TRANSDERMAL
  Filled 2019-05-27 (×9): qty 1

## 2019-05-27 MED ORDER — ENSURE ENLIVE PO LIQD
237.0000 mL | Freq: Three times a day (TID) | ORAL | Status: DC
  Administered 2019-05-27 – 2019-06-02 (×15): 237 mL via ORAL

## 2019-05-27 NOTE — Progress Notes (Signed)
Nutrition Follow-up  DOCUMENTATION CODES:   Not applicable  INTERVENTION:    Please obtain updated weight.  Ensure Enlive po TID, each supplement provides 350 kcal and 20 grams of protein  Continue Magic cup TID with meals, each supplement provides 290 kcal and 9 grams of protein  Continue MVI    NUTRITION DIAGNOSIS:   Inadequate oral intake related to acute illness as evidenced by per patient/family report.  Ongoing.   GOAL:   Patient will meet greater than or equal to 90% of their needs  Progressing.  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  Pt providing yes or no responses to RD questions. Pt reports appetite is good and that he is enjoying the food. Pt consuming Ensure Enlive and [employer] well and would like to continue receiving this. Pt states he has no questions for RD at this time.   ST recommending pt have 24/7 supervision at discharge.   Pt has not had updated wt since admission to CIR.   Diet Order: Heart Healthy/CHO Mod PO intake: 25-100% x last 8 recorded meals (78% average intake)  Medications reviewed and include: Ensure Enlive BID, SSI, MVI, Senokot-S, Thiamine  Labs reviewed.  UOP: 833ml x24 hours I/O: -70ml since admit   Diet Order:   Diet Order            Diet heart healthy/carb modified Room service appropriate? Yes with Assist; Fluid consistency: Thin  Diet effective now              EDUCATION NEEDS:   Education needs have been addressed  Skin:  Skin Assessment: Reviewed RN Assessment  Last BM:  1/29  Height:   Ht Readings from Last 1 Encounters:  05/20/19 6\' 3"  (1.905 m)     Weight:   Wt Readings from Last 1 Encounters:  05/20/19 73.5 kg    Ideal Body Weight:  89 kg  BMI:  Body mass index is 20.25 kg/m.  Estimated Nutritional Needs:   Kcal:  2100-2400kcal/day  Protein:  110-125g/day  Fluid:  >1.9L/day   Larkin Ina, MS, RD, LDN RD pager number and weekend/on-call pager number located in [place].

## 2019-05-27 NOTE — Progress Notes (Addendum)
[place] PHYSICAL MEDICINE & REHABILITATION PROGRESS NOTE   Subjective/Complaints:  No issues overnite , working with SLP.  Remains aphasic, trying to describe discomfort that he felt yesterday , points to lateral ribs   ROS- denies abd pain, , SOB  Objective:   No results found. No results for input(s): WBC, HGB, HCT, PLT in the last 72 hours. Recent Labs    05/27/19 0557  CREATININE 1.18    Intake/Output Summary (Last 24 hours) at 05/27/2019 [id number] Last data filed at 05/27/2019 0417 Gross per 24 hour  Intake 880 ml  Output 850 ml  Net 30 ml     Physical Exam: Vital Signs Blood pressure (!) 115/56, pulse 73, temperature 99.3 F (37.4 C), temperature source Oral, resp. rate 18, height 6\' 3"  (1.905 m), weight 73.5 kg, SpO2 95 %. Vitals reviewed. Constitutional: He is oriented to person, place, and time. He appears well-developed and well-nourished.  HENT:  Head: Normocephalic and atraumatic.  Eyes: EOM are normal. Right eye exhibits no discharge. Left eye exhibits no discharge.  Neck: No tracheal deviation present. No thyromegaly present.  Respiratory: Effort normal. No stridor. No respiratory distress. Lungs clear Heart- RRR no ubs murmur or ES  GI: Soft. He exhibits no distension. No epigastric tenderness  Musculoskeletal:     Comments: there is left lateral rib tendrness in mid axillary line just inferior to axilla   Neurological: He is alert and oriented to person, place, and time.  Patient is alert in no acute distress.   Follows commands and makes good eye contact with examiner.   Dysarthria, word finding difficulties.  Fair awareness of his deficits. Right facial weakness Motor: Right upper extremity: 3+/5 proximal distal Right lower extremity: 4-4+/5 proximal distal- pt requires some cues to activate Left upper extremity/left lower extremity: 5/5 proximal distal Expressive aphasia. Difficulty with naming tasks.  Skin: Skin is warm and dry. Bleeding from small  area in right lower abdomen; dressing applied. Minimal drainage at this time.  Psychiatric: He has a normal mood and affect. His behavior is normal.     Assessment/Plan: 1. 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 which require 3+ hours per day of interdisciplinary therapy in a comprehensive inpatient rehab setting.  Physiatrist is providing close team supervision and 24 hour management of active medical problems listed below.  Physiatrist and rehab team continue to assess barriers to discharge/monitor patient progress toward functional and medical goals  Care Tool:  Bathing    Body parts bathed by patient: Right arm, Left arm, Chest, Abdomen, Front perineal area, Buttocks, Right upper leg, Left upper leg, Right lower leg, Left lower leg, Face         Bathing assist Assist Level: Minimal Assistance - Patient > 75%     Upper Body Dressing/Undressing Upper body dressing   What is the patient wearing?: Pull over shirt    Upper body assist Assist Level: Supervision/Verbal cueing    Lower Body Dressing/Undressing Lower body dressing      What is the patient wearing?: Incontinence brief     Lower body assist Assist for lower body dressing: Maximal Assistance - Patient 25 - 49%     Toileting Toileting    Toileting assist Assist for toileting: Moderate Assistance - Patient 50 - 74%     Transfers Chair/bed transfer  Transfers assist     Chair/bed transfer assist level: Contact Guard/Touching assist     Locomotion Ambulation   Ambulation assist      Assist level:  Contact Guard/Touching assist Assistive device: Walker-rolling Max distance: 180   Walk 10 feet activity   Assist     Assist level: Contact Guard/Touching assist Assistive device: Walker-rolling   Walk 50 feet activity   Assist Walk 50 feet with 2 turns activity did not occur: Safety/medical concerns  Assist level: Contact Guard/Touching assist Assistive device: Walker-rolling     Walk 150 feet activity   Assist Walk 150 feet activity did not occur: Safety/medical concerns  Assist level: Contact Guard/Touching assist Assistive device: Walker-rolling    Walk 10 feet on uneven surface  activity   Assist Walk 10 feet on uneven surfaces activity did not occur: Safety/medical concerns         Wheelchair     Assist   Type of Wheelchair: Manual    Wheelchair assist level: Minimal Assistance - Patient > 75% Max wheelchair distance: 150    Wheelchair 50 feet with 2 turns activity    Assist        Assist Level: Minimal Assistance - Patient > 75%   Wheelchair 150 feet activity     Assist      Assist Level: Minimal Assistance - Patient > 75%   Blood pressure (!) 115/56, pulse 73, temperature 99.3 F (37.4 C), temperature source Oral, resp. rate 18, height 6\' 3"  (1.905 m), weight 73.5 kg, SpO2 95 %.  Medical Problem List and Plan: 1.  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       Continue CIR PT, OT, SLP  2.  Antithrombotics: -DVT/anticoagulation: Lovenox             -antiplatelet therapy: Aspirin 325 mg daily and Plavix 75 mg daily x3 months and aspirin alone 3. Pain Management: Tylenol as needed  Intermittent Left sided chest/rib pain, pain increased with taking a deep breath, pt is in no distress appears comfortable, no exacerbation while in therapy , unremarkable  CXR and EKG , monitor- exam is consistent with rib contusion- add Lidocaine patch  Normal ECHO 1/26 EKG    Borderline ST elevation in lateral leads-  Unchanged vs prior trace 4. Mood: Provide emotional support              -antipsychotic agents: N/A 5. Neuropsych: This patient is ?fully capable of making decisions on his own behalf. 6. Skin/Wound Care: Routine skin checks  2/4: Small area of bleeding in right lower abdomen with minimal bleeding on  dressing at this time. Not painful to patient. Possibly from Lovenox injection.  7. Fluids/Electrolytes/Nutrition: Routine in and outs.  8.  History of tobacco abuse as well as marijuana.  Provide counseling 9.  Hyperlipidemia.  Cont Lipitor.  10.  Diabetes mellitus.  Hemoglobin A1c 5.4.  SSI.  Patient on no medications prior to admission             Monitor with increased mobility.  11.  3.3 cm right thyroid nodule.  Follow-up as outpatient with ultrasound 12. Bradycardia: asymptomatic NSR on EKG 2/2  2/4: well controlled.  Vitals:   05/26/19 2112 05/27/19 0415  BP: (!) 116/49 (!) 115/56  Pulse: (!) 57 73  Resp: 18 18  Temp: 98.7 F (37.1 C) 99.3 F (37.4 C)  SpO2: 100% 95%    13.  Constipation increased Senna S  to 2 po BID - 2 stools yesterday   2/4: says he is moving his bowels regularly now.  LOS: 7 days A FACE TO FACE EVALUATION WAS PERFORMED  Charlett Blake 05/27/2019, 8:08 AM

## 2019-05-27 NOTE — Plan of Care (Signed)
  Problem: RH BOWEL ELIMINATION Goal: RH STG MANAGE BOWEL WITH ASSISTANCE Description: STG Manage Bowel with Assistance. Outcome: Progressing Goal: RH STG MANAGE BOWEL W/MEDICATION W/ASSISTANCE Description: STG Manage Bowel with Medication with Assistance. Min Outcome: Progressing   Problem: RH BLADDER ELIMINATION Goal: RH STG MANAGE BLADDER WITH ASSISTANCE Description: STG Manage Bladder With min Assistance Outcome: Progressing Goal: RH STG MANAGE BLADDER WITH EQUIPMENT WITH ASSISTANCE Description: STG Manage Bladder With Equipment With Assistance Outcome: Progressing   Problem: RH SKIN INTEGRITY Goal: RH STG SKIN FREE OF INFECTION/BREAKDOWN Description: Skin remains free of breakdown and infection - mod I Outcome: Progressing Goal: RH STG MAINTAIN SKIN INTEGRITY WITH ASSISTANCE Description: STG Maintain Skin Integrity With Assistance. Mod I Outcome: Progressing   Problem: RH SAFETY Goal: RH STG ADHERE TO SAFETY PRECAUTIONS W/ASSISTANCE/DEVICE Description: STG Adhere to Safety Precautions With Assistance/Device. Mod I Outcome: Progressing Goal: RH STG DECREASED RISK OF FALL WITH ASSISTANCE Description: STG Decreased Risk of Fall With Assistance. Mod I Outcome: Progressing   Problem: RH PAIN MANAGEMENT Goal: RH STG PAIN MANAGED AT OR BELOW PT'S PAIN GOAL Description: No pain or less than 3 Outcome: Progressing

## 2019-05-27 NOTE — Progress Notes (Signed)
Physical Therapy Session Note  Patient Details  Name: Ruben Moore MRN: [id number] Date of Birth: 02-07-1946  Today's Date: 05/27/2019 PT Individual Time: 0840-0959 PT Individual Time Calculation (min): 79 min   Short Term Goals: Week 1:  PT Short Term Goal 1 (Week 1): Pt will perform stand pivot transfers with CGA and LRAD PT Short Term Goal 2 (Week 1): Pt will ambulate 18f with CGA and LRAD PT Short Term Goal 3 (Week 1): Pt will propell WC x [id number] supervision assist PT Short Term Goal 4 (Week 1): Pt will ascend 4 steps with CGA and BUE support  Skilled Therapeutic Interventions/Progress Updates:   Pt received sitting in WC and agreeable to PT. Pt reports need for BM. Ambulatory transfer to toilet with supervision assist. Pt able to competed pericare with supervision assist and UE supported on RW. Pt able to don pants [facility]  Gait training with RW x [id number] supervision assist. PT then instructed pt in gait training without AD 2 x [id number] min-CGA. Dynamic gait training to weave through cones without AD 2 x 12 with min assist-CGA from PT. Stepping over 6 one inch obstacles x 2 leading with the RLE and x 2 leading the LLE. Dual task while ambulating to maintain grasp on ball with BUE and no AD; CGA from PT for safety. Min cues through for improved attention to the R knee to prevent GR as well as increased step length on the R and decreased step length on the R to improve symmetry of gait pattern.   Patient demonstrates increased fall risk as noted by score of   26/56 on Berg Balance Scale.  (<36= high risk for falls, close to 100%; 37-45 significant >80%; 46-51 moderate >50%; 52-55 lower >25%)  Patient returned to room and [facility] and all needs met.         Therapy Documentation Precautions:  Precautions Precautions: Fall Precaution Comments: Rt hemiplegia Restrictions Weight Bearing  Restrictions: No   Pain: Pain Assessment Pain Scale: Faces Pain Score: 7  Faces Pain Scale: No hurt Pain Type: Acute pain Pain Location: Rib cage Pain Orientation: Left;Mid Pain Descriptors / Indicators: Discomfort Pain Frequency: Occasional Patients Stated Pain Goal: 2 Pain Intervention(s): Medication (See eMAR)  Balance: Standardized Balance Assessment Standardized Balance Assessment: Berg Balance Test Berg Balance Test Sit to Stand: Able to stand  independently using hands Standing Unsupported: Able to stand 2 minutes with supervision Sitting with Back Unsupported but Feet Supported on Floor or Stool: Able to sit 30 seconds Stand to Sit: Controls descent by using hands Transfers: Able to transfer safely, definite need of hands Standing Unsupported with Eyes Closed: Able to stand 10 seconds with supervision Standing Ubsupported with Feet Together: Needs help to attain position and unable to hold for 15 seconds From Standing, Reach Forward with Outstretched Arm: Reaches forward but needs supervision From Standing Position, Pick up Object from Floor: Able to pick up shoe, needs supervision From Standing Position, Turn to Look Behind Over each Shoulder: Turn sideways only but maintains balance Turn 360 Degrees: Needs close supervision or verbal cueing Standing Unsupported, Alternately Place Feet on Step/Stool: Needs assistance to keep from falling or unable to try Standing Unsupported, One Foot in Front: Needs help to step but can hold 15 seconds Standing on One Leg: Tries to lift leg/unable to hold 3 seconds but remains standing independently Total Score: 26  Therapy/Group: Individual Therapy  Lorie Phenix 05/27/2019, 10:00 AM

## 2019-05-27 NOTE — Progress Notes (Signed)
Occupational Therapy Session Note  Patient Details  Name: Ruben Moore MRN: [id number] Date of Birth: [birth year]  Today's Date: 05/27/2019 OT Individual Time: 1300-1417 and [id number] OT Individual Time Calculation (min): 77 min and 27 min  Short Term Goals: Week 1:  OT Short Term Goal 1 (Week 1): Pt will complete 1/3 components of toileting with Min balance assist OT Short Term Goal 2 (Week 1): Pt will complete shower transfer with Min A and LRAD OT Short Term Goal 3 (Week 1): Pt will complete 2 grooming tasks while standing at the sink to improve standing endurance  Skilled Therapeutic Interventions/Progress Updates:    Pt greeted in w/c, requesting to shower. He completed toileting (with continent B+B void), bathing (sit<stand in shower), dressing (sit<stand from standard toilet using RW), and shaving (w/c level at sink) during session. All functional transfers completed at ambulatory level using RW with steady assist-supervision. During standing activity and transfers pt requires vcs for "soft knee" on the Rt due to hyperextension tendencies. During all tasks, pt requires vcs to increase functional use of Rt as he over compensates with the Lt. Had him reach for soap bottle on shelf and also for ADL items using Rt. He still has a very weak hand grip on the affected side but able to maintain grip on the grab bar and use a wash cloth to complete perihygiene in the back. Worked on forced use of Rt while pt reached for needed items during shaving, OT holding his Lt hand. Pt required Min A for thoroughness when shaving Rt side of face. Throughout session, encouraged pt to slow down, take rest breaks, and really concentrate on using his Rt UE more. Pt frustrated by his limited hand use and overall endurance, OT provided emotional support and education regarding CVA recovery. He also needed A to don Teds on Lt foot due to Lt hip pain when attempting himself. Pt remained in w/c at end of session with  safety belt fastened and all needs within reach. Tx focus placed on ADL retraining, Rt NMR, balance, and activity tolerance.   2nd Session 1:1 tx (27 min) Pt greeted in w/c, agreeable to extra therapy time, requesting to use the bathroom. Started session with ambulatory transfer to toilet using RW with steady assist. After he completed clothing mgt, pt with continent B+B void, noted bladder incontinence in brief. Pt completed hygiene and clothing mgt with Min A for donning clean brief. Handwashing completed while standing at the sink after with vcs for soft knees bilaterally. Once he returned to the w/c, worked on R UE grasp/release and gross motor coordination via bean bag toss. Had pt reach outside of base of support to target, encouraged grasping with more digits and increasing power when throwing. Noted some pain with shoulder flexion >45 degrees, modified activity to address this. At end of session pt remained in w/c with all needs within reach and safety belt fastened, reminded him to engage in his R UE HEPs independently for NMR outside of therapy.    Therapy Documentation Precautions:  Precautions Precautions: Fall Precaution Comments: Rt hemiplegia Restrictions Weight Bearing Restrictions: No Vital Signs: Therapy Vitals Temp: (!) 97.3 F (36.3 C) Pulse Rate: 68 Resp: 18 BP: (!) 123/57 Patient Position (if appropriate): Sitting Oxygen Therapy SpO2: 100 % Pain: in Lt hip when using figure 4 position functionally after a minute or so, he did not want anything medicinally from RN to address this   ADL: ADL Eating: Not assessed Grooming: Moderate assistance Where Assessed-Grooming:  Chair Upper Body Bathing: Supervision/safety Where Assessed-Upper Body Bathing: Edge of bed Lower Body Bathing: Minimal assistance Where Assessed-Lower Body Bathing: Edge of bed Upper Body Dressing: Supervision/safety Where Assessed-Upper Body Dressing: Edge of bed Lower Body Dressing: Moderate  assistance Where Assessed-Lower Body Dressing: Edge of bed Toileting: Not assessed Toilet Transfer: Minimal assistance Toilet Transfer Method: Ambulating(RW) Tub/Shower Transfer: Not assessed      Therapy/Group: Individual Therapy  Dima Ferrufino A Merlene Dante 05/27/2019, 4:24 PM

## 2019-05-27 NOTE — Progress Notes (Signed)
Speech Language Pathology Weekly Progress and Session Note  Patient Details  Name: Ruben Moore MRN: [id number] Date of Birth: Oct 24, 1945  Beginning of progress report period: May 20, 2019 End of progress report period: May 27, 2019  Today's Date: 05/27/2019 SLP Individual Time: 0733-0829 SLP Individual Time Calculation (min): 56 min  Short Term Goals: Week 1: SLP Short Term Goal 1 (Week 1): Pt will utilize word finding strategies to create list of 10 words within specified category with Min A cues. SLP Short Term Goal 1 - Progress (Week 1): Progressing toward goal SLP Short Term Goal 2 (Week 1): Pt will utilize word finding strategies to convey basic information with Min A cues. SLP Short Term Goal 2 - Progress (Week 1): Met SLP Short Term Goal 3 (Week 1): Pt will self-monitor and self-correct word finding errors in 7 out of 10 opportunities with Mod A cues. SLP Short Term Goal 3 - Progress (Week 1): Met SLP Short Term Goal 4 (Week 1): Pt will implement speech intelligibility strategies to increase speech intelligiblity at simple conversation to >90% intelligibility and Min A cues. SLP Short Term Goal 4 - Progress (Week 1): Met    New Short Term Goals: Week 2: SLP Short Term Goal 1 (Week 2): STG=LTG due to remaining length of stay  Weekly Progress Updates: Pt has made good functional gains and met 3 out of 4 short term goals this reporting period. Pt has expressive aphasia; he is currently expressing basic wants and needs with Min A for higher level word finding difficulties, however Mod A still required in some structured instances such as generative naming tasks. Pt responds well to visual and semantic cues in instances of word finding difficulties. He is become much more aware of errors and can often self-correct (~70% of the time). Pt education is ongoing; no family has been present for ST sessions yet. Pt would continue to benefit from skilled ST while inpatient in order to  maximize functional independence and reduce burden of care prior to discharge. Anticipate that pt will need 24/7 supervision at discharge in addition to [facility] follow up at next level of care.      Intensity: Minumum of 1-2 x/day, 30 to 90 minutes Frequency: 3 to 5 out of 7 days Duration/Length of Stay: 06/02/18 Treatment/Interventions: Speech/Language facilitation;Therapeutic Activities;Patient/family education;Functional tasks;Cueing hierarchy;Internal/external aids   Daily Session  Skilled Therapeutic Interventions: Pt was seen for skilled ST targeting communication goals. Pt demonstrated improvement in ability to generate names of animals on his homework task - he generated 6 independently, then required semantic attribute description cues to generate 4 additional animals. During picture description tasks, pt expressed actions and object names, describing events and objects within the picture as well as made inferences regarding situations/time of year/relationships etc. at the sentence level with overall Min A semantic and/or question cues. Pt detected and corrected ~70% of verbal errors. Pt identified opposites (from multiple choice questions) with 8/12 accuracy. Overall Supervision A required for corrections. Pt required Mod A semantic descriptor cues to generate names of farm animals. Generative naming homework left with pt to complete prior to next session. He reported he appreciates the extra practice and has demonstrated good initiative in his efforts to complete it each time. Pt left sitting in wheelchair with seatbelt alarm in place, needs met to his satisfaction. Continue per current plan of care.       Pain Pain Assessment Pain Scale: Faces Faces Pain Scale: No hurt   Therapy/Group: Individual Therapy  Arbutus Leas 05/27/2019, 9:29 AM

## 2019-05-28 ENCOUNTER — Inpatient Hospital Stay (HOSPITAL_COMMUNITY): Payer: Non-veteran care | Admitting: Occupational Therapy

## 2019-05-28 LAB — GLUCOSE, CAPILLARY
Glucose-Capillary: 91 mg/dL (ref 70–99)
Glucose-Capillary: 104 mg/dL — ABNORMAL HIGH (ref 70–99)
Glucose-Capillary: 130 mg/dL — ABNORMAL HIGH (ref 70–99)
Glucose-Capillary: 116 mg/dL — ABNORMAL HIGH (ref 70–99)

## 2019-05-28 NOTE — Progress Notes (Signed)
Occupational Therapy Session Note  Patient Details  Name: Ruben Moore MRN: [id number] Date of Birth: [birth year]  Today's Date: 05/28/2019 OT Individual Time: 1455-1558 OT Individual Time Calculation (min): 63 min    Short Term Goals: Week 1:  OT Short Term Goal 1 (Week 1): Pt will complete 1/3 components of toileting with Min balance assist OT Short Term Goal 2 (Week 1): Pt will complete shower transfer with Min A and LRAD OT Short Term Goal 3 (Week 1): Pt will complete 2 grooming tasks while standing at the sink to improve standing endurance  Skilled Therapeutic Interventions/Progress Updates:    Upon entering the room, pt seated in wheelchair awaiting OT arrival. When asked how he felt pt shakes R UE at therapist and says, " I just want it to do what it's supposed to do." OT educating pt on stroke recovery, time frame, and goals. OT asking pt to make small goal for himself each day. Pt was receptive to information. Pt requesting to ambulating without RW and ambulates with min hand held assistance into day room 75'. Pt standing for 20 minutes and 15 minutes respectively. Second stand was on foam airex for increased balance challenge. Pt needing supervision - min guard for balance. OT provided pt with theraputty another level up from last one and reviewed HEP with pt once more. Pt needing min cuing for proper technique. Pt returning back to room in same manner and asking to use bathroom. Pt ambulating with RW into bathroom with min guard and able to perform clothing management and hygiene with min guard as well after voiding. Pt returning to wheelchair with chair alarm activated and call bell within reach.   Therapy Documentation Precautions:  Precautions Precautions: Fall Precaution Comments: Rt hemiplegia Restrictions Weight Bearing Restrictions: No ADL: ADL Eating: Not assessed Grooming: Moderate assistance Where Assessed-Grooming: Chair Upper Body Bathing:  Supervision/safety Where Assessed-Upper Body Bathing: Edge of bed Lower Body Bathing: Minimal assistance Where Assessed-Lower Body Bathing: Edge of bed Upper Body Dressing: Supervision/safety Where Assessed-Upper Body Dressing: Edge of bed Lower Body Dressing: Moderate assistance Where Assessed-Lower Body Dressing: Edge of bed Toileting: Not assessed Toilet Transfer: Minimal assistance Toilet Transfer Method: Ambulating(RW) Tub/Shower Transfer: Not assessed   Therapy/Group: Individual Therapy  [name] 05/28/2019, 4:05 PM

## 2019-05-28 NOTE — Progress Notes (Signed)
Occupational Therapy Weekly Progress Note  Patient Details  Name: Ruben Moore MRN: [id number] Date of Birth: [birth year]  Beginning of progress report period: May 21, 2019 End of progress report period: May 28, 2019    Patient has met 3 of 3. Pt making steady progress this week towards occupational therapy goals. Pt currently performing functional transfer with RW and CGA.   Pt showered this week with min guard for balance while seated on shower chair. Pt needing max cuing in sessions to integrate R UE into functional tasks. He was able to wash self with R UE with cuing to do so. Pt unable to grip pants or socks with R UE to don secondary to needing cuing attention to perform tasks. Pt is frustrated with progress at this time and needs additional encouragement and education for expectations. Neuropsych to see this week.   Patient continues to demonstrate the following deficits: muscle weakness, decreased cardiorespiratoy endurance, decreased coordination and decreased motor planning and decreased standing balance, decreased postural control, hemiplegia and decreased balance strategies and therefore will continue to benefit from skilled OT intervention to enhance overall performance with BADL and iADL.  Patient progressing toward long term goals..  Continue plan of care.  OT Short Term Goals Week 1:  OT Short Term Goal 1 (Week 1): Pt will complete 1/3 components of toileting with Min balance assist OT Short Term Goal 1 - Progress (Week 1): Met OT Short Term Goal 2 (Week 1): Pt will complete shower transfer with Min A and LRAD OT Short Term Goal 2 - Progress (Week 1): Met OT Short Term Goal 3 (Week 1): Pt will complete 2 grooming tasks while standing at the sink to improve standing endurance OT Short Term Goal 3 - Progress (Week 1): Met Week 2:  OT Short Term Goal 1 (Week 2): STGS=LTGs secondary to upcoming discharge     Therapy Documentation Precautions:   Precautions Precautions: Fall Precaution Comments: Rt hemiplegia Restrictions Weight Bearing Restrictions: No ADL: ADL Eating: Not assessed Grooming: Moderate assistance Where Assessed-Grooming: Chair Upper Body Bathing: Supervision/safety Where Assessed-Upper Body Bathing: Edge of bed Lower Body Bathing: Minimal assistance Where Assessed-Lower Body Bathing: Edge of bed Upper Body Dressing: Supervision/safety Where Assessed-Upper Body Dressing: Edge of bed Lower Body Dressing: Moderate assistance Where Assessed-Lower Body Dressing: Edge of bed Toileting: Not assessed Toilet Transfer: Minimal assistance Toilet Transfer Method: Ambulating(RW) Tub/Shower Transfer: Not assessed   Therapy/Group: Individual Therapy  [name] 05/28/2019, 4:28 PM

## 2019-05-28 NOTE — Progress Notes (Signed)
[place] PHYSICAL MEDICINE & REHABILITATION PROGRESS NOTE   Subjective/Complaints:  Pt has head under covers- when asked if any issues- he shook head no and immediately got back under covers including covering his head.   ROS- denies abd pain, , SOB  Objective:   No results found. No results for input(s): WBC, HGB, HCT, PLT in the last 72 hours. Recent Labs    05/27/19 0557  CREATININE 1.18    Intake/Output Summary (Last 24 hours) at 05/28/2019 1416 Last data filed at 05/28/2019 0900 Gross per 24 hour  Intake 477 ml  Output 800 ml  Net -323 ml     Physical Exam: Vital Signs Blood pressure 112/61, pulse 70, temperature 97.9 F (36.6 C), resp. rate 20, height 6\' 3"  (1.905 m), weight 73.5 kg, SpO2 97 %. Vitals reviewed. Constitutional: aphasic; nonverbal so far this AM; but was appropriate, NAD  HENT:  Head: Normocephalic and atraumatic.  Eyes: EOM are normal. Right eye exhibits no discharge. Left eye exhibits no discharge.  Neck: No tracheal deviation present. No thyromegaly present.  Respiratory: Effort normal. No stridor. No respiratory distress. Lungs clear Heart- RRR no ubs murmur or ES  GI: Soft. He exhibits no distension. No epigastric tenderness  Musculoskeletal:     Comments: there is left lateral rib tendrness in mid axillary line just inferior to axilla   Neurological: He is alert and oriented to person, place, and time.  Patient is alert in no acute distress.   Follows commands and makes good eye contact with examiner.   Dysarthria, word finding difficulties.  Fair awareness of his deficits. Right facial weakness Motor: Right upper extremity: 3+/5 proximal distal Right lower extremity: 4-4+/5 proximal distal- pt requires some cues to activate Left upper extremity/left lower extremity: 5/5 proximal distal Expressive aphasia. Difficulty with naming tasks.  Skin: Skin is warm and dry. Bleeding from small area in right lower abdomen; dressing applied. Minimal  drainage at this time.  Psychiatric: He has a normal mood and affect. His behavior is normal.     Assessment/Plan: 1. 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 which require 3+ hours per day of interdisciplinary therapy in a comprehensive inpatient rehab setting.  Physiatrist is providing close team supervision and 24 hour management of active medical problems listed below.  Physiatrist and rehab team continue to assess barriers to discharge/monitor patient progress toward functional and medical goals  Care Tool:  Bathing    Body parts bathed by patient: Right arm, Left arm, Chest, Abdomen, Front perineal area, Buttocks, Right upper leg, Left upper leg, Right lower leg, Left lower leg, Face         Bathing assist Assist Level: Contact Guard/Touching assist(vcs for holding grab bar with Rt hand during dynamic sitting for improved stability)     Upper Body Dressing/Undressing Upper body dressing   What is the patient wearing?: Pull over shirt    Upper body assist Assist Level: Supervision/Verbal cueing    Lower Body Dressing/Undressing Lower body dressing      What is the patient wearing?: Incontinence brief, Pants     Lower body assist Assist for lower body dressing: Minimal Assistance - Patient > 75%     Toileting Toileting    Toileting assist Assist for toileting: Contact Guard/Touching assist     Transfers Chair/bed transfer  Transfers assist     Chair/bed transfer assist level: Contact Guard/Touching assist     Locomotion Ambulation   Ambulation assist      Assist level:  Contact Guard/Touching assist Assistive device: Walker-rolling Max distance: 180   Walk 10 feet activity   Assist     Assist level: Contact Guard/Touching assist Assistive device: Walker-rolling   Walk 50 feet activity   Assist Walk 50 feet with 2 turns activity did not occur: Safety/medical concerns  Assist level: Contact Guard/Touching assist Assistive  device: Walker-rolling    Walk 150 feet activity   Assist Walk 150 feet activity did not occur: Safety/medical concerns  Assist level: Contact Guard/Touching assist Assistive device: Walker-rolling    Walk 10 feet on uneven surface  activity   Assist Walk 10 feet on uneven surfaces activity did not occur: Safety/medical concerns         Wheelchair     Assist   Type of Wheelchair: Manual    Wheelchair assist level: Minimal Assistance - Patient > 75% Max wheelchair distance: 150    Wheelchair 50 feet with 2 turns activity    Assist        Assist Level: Minimal Assistance - Patient > 75%   Wheelchair 150 feet activity     Assist      Assist Level: Minimal Assistance - Patient > 75%   Blood pressure 112/61, pulse 70, temperature 97.9 F (36.6 C), resp. rate 20, height 6\' 3"  (1.905 m), weight 73.5 kg, SpO2 97 %.  Medical Problem List and Plan: 1.  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       Continue CIR PT, OT, SLP  2.  Antithrombotics: -DVT/anticoagulation: Lovenox             -antiplatelet therapy: Aspirin 325 mg daily and Plavix 75 mg daily x3 months and aspirin alone 3. Pain Management: Tylenol as needed  Intermittent Left sided chest/rib pain, pain increased with taking a deep breath, pt is in no distress appears comfortable, no exacerbation while in therapy , unremarkable  CXR and EKG , monitor- exam is consistent with rib contusion- add Lidocaine patch  Normal ECHO 1/26 EKG    Borderline ST elevation in lateral leads-  Unchanged vs prior trace 4. Mood: Provide emotional support              -antipsychotic agents: N/A 5. Neuropsych: This patient is ?fully capable of making decisions on his own behalf. 6. Skin/Wound Care: Routine skin checks  2/4: Small area of bleeding in right lower abdomen with minimal bleeding on dressing at  this time. Not painful to patient. Possibly from Lovenox injection.  7. Fluids/Electrolytes/Nutrition: Routine in and outs.  8.  History of tobacco abuse as well as marijuana.  Provide counseling 9.  Hyperlipidemia.  Cont Lipitor.  10.  Diabetes mellitus.  Hemoglobin A1c 5.4.  SSI.  Patient on no medications prior to admission             Monitor with increased mobility.  11.  3.3 cm right thyroid nodule.  Follow-up as outpatient with ultrasound 12. Bradycardia: asymptomatic NSR on EKG 2/2  2/4: well controlled.  Vitals:   05/27/19 1933 05/28/19 0622  BP: 136/61 112/61  Pulse: 62 70  Resp: 18 20  Temp: 98.2 F (36.8 C) 97.9 F (36.6 C)  SpO2: 100% 97%    13.  Constipation increased Senna S  to 2 po BID - 2 stools yesterday   2/4: says he is moving his bowels regularly now.   LOS: 8 days A FACE TO FACE  EVALUATION WAS PERFORMED  Susie Pousson 05/28/2019, 2:16 PM

## 2019-05-28 NOTE — Progress Notes (Signed)
Physical Therapy Weekly Progress Note  Patient Details  Name: Ruben Moore MRN: [id number] Date of Birth: 13-Feb-1946  Beginning of progress report period: May 21, 2019 End of progress report period: May 28, 2019  Today's Date: 05/28/2019     Patient has met 4 of 4 short term goals.  Pt is making faster than expected progress towards LTG. Over the past week pt has progressed to supervision assist ambulation up to 176f with RW and min-CGA without AD for house hold distances, as well as supervision assist with RW for transfers, and WC mobility. Significant improvement noted in balance and attention to the RLE which has greatly decreased GR in the R knee.   Patient continues to demonstrate the following deficits muscle weakness and muscle paralysis, decreased cardiorespiratoy endurance, impaired timing and sequencing and unbalanced muscle activation, decreased attention to right and decreased standing balance, decreased postural control, hemiplegia and decreased balance strategies and therefore will continue to benefit from skilled PT intervention to increase functional independence with mobility.  Patient progressing toward long term goals..  Continue plan of care.  PT Short Term Goals Week 1:  PT Short Term Goal 1 (Week 1): Pt will perform stand pivot transfers with CGA and LRAD PT Short Term Goal 1 - Progress (Week 1): Met PT Short Term Goal 2 (Week 1): Pt will ambulate 1523fwith CGA and LRAD PT Short Term Goal 2 - Progress (Week 1): Met PT Short Term Goal 3 (Week 1): Pt will propell WC x [id number] supervision assist PT Short Term Goal 3 - Progress (Week 1): Met PT Short Term Goal 4 (Week 1): Pt will ascend 4 steps with CGA and BUE support PT Short Term Goal 4 - Progress (Week 1): Met Week 2:  PT Short Term Goal 1 (Week 2): STG=LTG due to ELOS  Skilled Therapeutic Interventions/Progress Updates:  Ambulation/gait training;Discharge planning;Therapeutic Activities;Functional  mobility training;Psychosocial support;Visual/perceptual remediation/compensation;Wheelchair propulsion/positioning;Therapeutic Exercise;Skin care/wound management;Neuromuscular re-education;Disease management/prevention;Balance/vestibular training;Cognitive remediation/compensation;DME/adaptive equipment instruction;Pain management;Splinting/orthotics;UE/LE Strength taining/ROM;UE/LE Coordination activities;Stair training;Patient/family education;Functional electrical stimulation;Community reintegration   Therapy Documentation Precautions:  Precautions Precautions: Fall Precaution Comments: Rt hemiplegia Restrictions Weight Bearing Restrictions: No Vital Signs: Therapy Vitals Temp: 97.8 F (36.6 C) Pulse Rate: 67 Resp: 18 BP: (!) 132/58 Patient Position (if appropriate): Sitting Oxygen Therapy SpO2: 100 %   Therapy/Group: Individual Therapy  AusLorie Phenix6/2021, 5:43 PM

## 2019-05-29 LAB — GLUCOSE, CAPILLARY
Glucose-Capillary: 101 mg/dL — ABNORMAL HIGH (ref 70–99)
Glucose-Capillary: 124 mg/dL — ABNORMAL HIGH (ref 70–99)
Glucose-Capillary: 140 mg/dL — ABNORMAL HIGH (ref 70–99)

## 2019-05-29 NOTE — Progress Notes (Signed)
[place] PHYSICAL MEDICINE & REHABILITATION PROGRESS NOTE   Subjective/Complaints: Pt had come out of covers early this AM, but very emphatic, "no issues" and got back underneath covers, head included.  Marland Kitchen   ROS- denies abd pain, , SOB  Objective:   No results found. No results for input(s): WBC, HGB, HCT, PLT in the last 72 hours. Recent Labs    05/27/19 0557  CREATININE 1.18    Intake/Output Summary (Last 24 hours) at 05/29/2019 1015 Last data filed at 05/29/2019 0400 Gross per 24 hour  Intake 237 ml  Output 250 ml  Net -13 ml     Physical Exam: Vital Signs Blood pressure 130/66, pulse 71, temperature 98.2 F (36.8 C), resp. rate 18, height 6\' 3"  (1.905 m), weight 73.5 kg, SpO2 99 %. Vitals reviewed. Constitutional: aphasic; did say "no when asked if any issues". ; but was appropriate, NAD  HENT:  Head: Normocephalic and atraumatic.  Eyes: EOM are normal. Right eye exhibits no discharge. Left eye exhibits no discharge.  Neck: No tracheal deviation present. No thyromegaly present.  Respiratory: Effort normal. No stridor. No respiratory distress. Lungs clear Heart- RRR no ubs murmur or ES  GI: Soft. He exhibits no distension. No epigastric tenderness  Musculoskeletal:     Comments: there is left lateral rib tendrness in mid axillary line just inferior to axilla   Neurological: He is alert and oriented to person, place, and time.  Patient is alert in no acute distress.   Follows commands and makes good eye contact with examiner.   Dysarthria, word finding difficulties.  Fair awareness of his deficits. Right facial weakness Motor: Right upper extremity: 3+/5 proximal distal Right lower extremity: 4-4+/5 proximal distal- pt requires some cues to activate Left upper extremity/left lower extremity: 5/5 proximal distal Expressive aphasia. Difficulty with naming tasks.  Skin: Skin is warm and dry. Bleeding from small area in right lower abdomen; dressing applied. Minimal  drainage at this time.  Psychiatric: He has a normal mood and affect. His behavior is normal.     Assessment/Plan: 1. 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 which require 3+ hours per day of interdisciplinary therapy in a comprehensive inpatient rehab setting.  Physiatrist is providing close team supervision and 24 hour management of active medical problems listed below.  Physiatrist and rehab team continue to assess barriers to discharge/monitor patient progress toward functional and medical goals  Care Tool:  Bathing    Body parts bathed by patient: Right arm, Left arm, Chest, Abdomen, Front perineal area, Buttocks, Right upper leg, Left upper leg, Right lower leg, Left lower leg, Face         Bathing assist Assist Level: Contact Guard/Touching assist(vcs for holding grab bar with Rt hand during dynamic sitting for improved stability)     Upper Body Dressing/Undressing Upper body dressing   What is the patient wearing?: Pull over shirt    Upper body assist Assist Level: Supervision/Verbal cueing    Lower Body Dressing/Undressing Lower body dressing      What is the patient wearing?: Incontinence brief, Pants     Lower body assist Assist for lower body dressing: Minimal Assistance - Patient > 75%     Toileting Toileting    Toileting assist Assist for toileting: Contact Guard/Touching assist     Transfers Chair/bed transfer  Transfers assist     Chair/bed transfer assist level: Contact Guard/Touching assist     Locomotion Ambulation   Ambulation assist  Assist level: Minimal Assistance - Patient > 75% Assistive device: Hand held assist Max distance: 75'   Walk 10 feet activity   Assist     Assist level: Minimal Assistance - Patient > 75% Assistive device: Hand held assist   Walk 50 feet activity   Assist Walk 50 feet with 2 turns activity did not occur: Safety/medical concerns  Assist level: Minimal Assistance - Patient >  75% Assistive device: Walker-rolling    Walk 150 feet activity   Assist Walk 150 feet activity did not occur: Safety/medical concerns  Assist level: Contact Guard/Touching assist Assistive device: Walker-rolling    Walk 10 feet on uneven surface  activity   Assist Walk 10 feet on uneven surfaces activity did not occur: Safety/medical concerns         Wheelchair     Assist   Type of Wheelchair: Manual    Wheelchair assist level: Minimal Assistance - Patient > 75% Max wheelchair distance: 150    Wheelchair 50 feet with 2 turns activity    Assist        Assist Level: Minimal Assistance - Patient > 75%   Wheelchair 150 feet activity     Assist      Assist Level: Minimal Assistance - Patient > 75%   Blood pressure 130/66, pulse 71, temperature 98.2 F (36.8 C), resp. rate 18, height 6\' 3"  (1.905 m), weight 73.5 kg, SpO2 99 %.  Medical Problem List and Plan: 1.  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       Continue CIR PT, OT, SLP  2.  Antithrombotics: -DVT/anticoagulation: Lovenox             -antiplatelet therapy: Aspirin 325 mg daily and Plavix 75 mg daily x3 months and aspirin alone 3. Pain Management: Tylenol as needed  Intermittent Left sided chest/rib pain, pain increased with taking a deep breath, pt is in no distress appears comfortable, no exacerbation while in therapy , unremarkable  CXR and EKG , monitor- exam is consistent with rib contusion- add Lidocaine patch  Normal ECHO 1/26 EKG    Borderline ST elevation in lateral leads-  Unchanged vs prior trace 4. Mood: Provide emotional support              -antipsychotic agents: N/A 5. Neuropsych: This patient is ?fully capable of making decisions on his own behalf. 6. Skin/Wound Care: Routine skin checks  2/4: Small area of bleeding in right lower abdomen with minimal bleeding  on dressing at this time. Not painful to patient. Possibly from Lovenox injection.  7. Fluids/Electrolytes/Nutrition: Routine in and outs.  8.  History of tobacco abuse as well as marijuana.  Provide counseling 9.  Hyperlipidemia.  Cont Lipitor.  10.  Diabetes mellitus.  Hemoglobin A1c 5.4.  SSI.  Patient on no medications prior to admission             Monitor with increased mobility.  11.  3.3 cm right thyroid nodule.  Follow-up as outpatient with ultrasound 12. Bradycardia: asymptomatic NSR on EKG 2/2  2/4: well controlled.  Vitals:   05/28/19 2017 05/29/19 0401  BP: 135/63 130/66  Pulse: 63 71  Resp: 18 18  Temp: 97.8 F (36.6 C) 98.2 F (36.8 C)  SpO2: 100% 99%    13.  Constipation increased Senna S  to 2 po BID - 2 stools yesterday   2/4: says he  is moving his bowels regularly now.   LOS: 9 days A FACE TO FACE EVALUATION WAS PERFORMED  Rahmah Mccamy 05/29/2019, 10:15 AM

## 2019-05-30 ENCOUNTER — Inpatient Hospital Stay (HOSPITAL_COMMUNITY): Payer: Non-veteran care | Admitting: Occupational Therapy

## 2019-05-30 ENCOUNTER — Inpatient Hospital Stay (HOSPITAL_COMMUNITY): Payer: Non-veteran care | Admitting: Physical Therapy

## 2019-05-30 ENCOUNTER — Inpatient Hospital Stay (HOSPITAL_COMMUNITY): Payer: Non-veteran care | Admitting: Speech Pathology

## 2019-05-30 LAB — GLUCOSE, CAPILLARY
Glucose-Capillary: 92 mg/dL (ref 70–99)
Glucose-Capillary: 95 mg/dL (ref 70–99)
Glucose-Capillary: 132 mg/dL — ABNORMAL HIGH (ref 70–99)
Glucose-Capillary: 106 mg/dL — ABNORMAL HIGH (ref 70–99)
Glucose-Capillary: 107 mg/dL — ABNORMAL HIGH (ref 70–99)

## 2019-05-30 NOTE — Progress Notes (Signed)
Physical Therapy Session Note  Patient Details  Name: Ruben Moore MRN: [id number] Date of Birth: [birth year]  Today's Date: 05/30/2019 PT Individual Time: 1000-1100 PT Individual Time Calculation (min): 60 min   Short Term Goals: Week 2:  PT Short Term Goal 1 (Week 2): STG=LTG due to ELOS  Skilled Therapeutic Interventions/Progress Updates: Pt presented in bed agreeable to therapy. Pt denies pain during session. Pt requesting to use bathroom, performed bed mobility supervision and ambulated to bathroom CGA no AD. Performed toilet transfers with supervision (+void). Pt then ambulated to sink to perform hand hygiene and then transferred to bed to don jeans and shirt with set up. Pt then ambulated to rehab gym CGA with RW with intermittent cues for R knee control. Participated in balance coordination activities stepping over hockey sticks in 4 square pattern. Pt required minA when stepping to L due to poor placement of LLE and stepping on stick with RLE regularly. Pt was able to improve RLE placement and clearance with repetitions. Participated in x2 bouts of horseshoes with 1 round standing on Airex and second bout with LLE on air disk. Pt demonstrated improvement in grasping and releasing horseshoes however required mod cues to minimize locking of R knee. Pt noted to have better control in stance of air disk vs BLE on Airex. Participated in pipe tree activity while standing on Airex for forced use of RUE. Participated in obstacle course stepping over threshold, gait on mat, and weaving through cones with RW. Performed at [facility] and required cues for safety with RW particularly when stepping over threshold. Pt ambulated back to room at end of session and requested to return to bed. Performed bed mobility with bed flat and without features. Pt left in bed with alarm on and needs met.      Therapy Documentation Precautions:  Precautions Precautions: Fall Precaution Comments: Rt  hemiplegia Restrictions Weight Bearing Restrictions: No   Therapy/Group: Individual Therapy  Beronica Lansdale  Sharlette Jansma, PTA  05/30/2019, 3:39 PM

## 2019-05-30 NOTE — Progress Notes (Signed)
[facility] PHYSICAL MEDICINE & REHABILITATION PROGRESS NOTE   Subjective/Complaints:  No issues overnite, discussed need for Mod I level, does have daughters in the area that do not work and can assist with shopping etc   ROS- denies abd pain, , SOB  Objective:   No results found. No results for input(s): WBC, HGB, HCT, PLT in the last 72 hours. No results for input(s): NA, K, CL, CO2, GLUCOSE, BUN, CREATININE, CALCIUM in the last 72 hours.  Intake/Output Summary (Last 24 hours) at 05/30/2019 0758 Last data filed at 05/30/2019 0629 Gross per 24 hour  Intake 237 ml  Output 1250 ml  Net -1013 ml     Physical Exam: Vital Signs Blood pressure 116/65, pulse 76, temperature 98.3 F (36.8 C), resp. rate 16, height 6\' 3"  (1.905 m), weight 74.1 kg, SpO2 98 %. Vitals reviewed. Constitutional: aphasic; did say "no when asked if any issues". ; but was appropriate, NAD  HENT:  Head: Normocephalic and atraumatic.  Eyes: EOM are normal. Right eye exhibits no discharge. Left eye exhibits no discharge.  Neck: No tracheal deviation present. No thyromegaly present.  Respiratory: Effort normal. No stridor. No respiratory distress. Lungs clear Heart- RRR no ubs murmur or ES  GI: Soft. He exhibits no distension. No epigastric tenderness  Musculoskeletal:     Comments: there is left parasternal and  lateral rib tenderness in mid axillary line just inferior to axilla   Neurological: He is alert and oriented to person, place, and time.  Patient is alert in no acute distress.   Follows commands and makes good eye contact with examiner.   Dysarthria, word finding difficulties.  Fair awareness of his deficits. Right facial weakness Motor: Right upper extremity: 3+/5 proximal distal Right lower extremity: 4-4+/5 proximal distal- pt requires some cues to activate Left upper extremity/left lower extremity: 5/5 proximal distal Expressive aphasia. Difficulty with naming tasks.  Skin: Skin is warm and dry.  Bleeding from small area in right lower abdomen; dressing applied. Minimal drainage at this time.  Psychiatric: He has a normal mood and affect. His behavior is normal.     Assessment/Plan: 1. 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 which require 3+ hours per day of interdisciplinary therapy in a comprehensive inpatient rehab setting.  Physiatrist is providing close team supervision and 24 hour management of active medical problems listed below.  Physiatrist and rehab team continue to assess barriers to discharge/monitor patient progress toward functional and medical goals  Care Tool:  Bathing    Body parts bathed by patient: Right arm, Left arm, Chest, Abdomen, Front perineal area, Buttocks, Right upper leg, Left upper leg, Right lower leg, Left lower leg, Face         Bathing assist Assist Level: Contact Guard/Touching assist(vcs for holding grab bar with Rt hand during dynamic sitting for improved stability)     Upper Body Dressing/Undressing Upper body dressing   What is the patient wearing?: Pull over shirt    Upper body assist Assist Level: Supervision/Verbal cueing    Lower Body Dressing/Undressing Lower body dressing      What is the patient wearing?: Incontinence brief, Pants     Lower body assist Assist for lower body dressing: Minimal Assistance - Patient > 75%     Toileting Toileting    Toileting assist Assist for toileting: Contact Guard/Touching assist     Transfers Chair/bed transfer  Transfers assist     Chair/bed transfer assist level: Contact Guard/Touching assist  Locomotion Ambulation   Ambulation assist      Assist level: Minimal Assistance - Patient > 75% Assistive device: Hand held assist Max distance: 75'   Walk 10 feet activity   Assist     Assist level: Minimal Assistance - Patient > 75% Assistive device: Hand held assist   Walk 50 feet activity   Assist Walk 50 feet with 2 turns activity did not  occur: Safety/medical concerns  Assist level: Minimal Assistance - Patient > 75% Assistive device: Walker-rolling    Walk 150 feet activity   Assist Walk 150 feet activity did not occur: Safety/medical concerns  Assist level: Contact Guard/Touching assist Assistive device: Walker-rolling    Walk 10 feet on uneven surface  activity   Assist Walk 10 feet on uneven surfaces activity did not occur: Safety/medical concerns         Wheelchair     Assist   Type of Wheelchair: Manual    Wheelchair assist level: Minimal Assistance - Patient > 75% Max wheelchair distance: 150    Wheelchair 50 feet with 2 turns activity    Assist        Assist Level: Minimal Assistance - Patient > 75%   Wheelchair 150 feet activity     Assist      Assist Level: Minimal Assistance - Patient > 75%   Blood pressure 116/65, pulse 76, temperature 98.3 F (36.8 C), resp. rate 16, height 6\' 3"  (1.905 m), weight 74.1 kg, SpO2 98 %.  Medical Problem List and Plan: 1.  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       Continue CIR PT, OT, SLP  2.  Antithrombotics: -DVT/anticoagulation: Lovenox             -antiplatelet therapy: Aspirin 325 mg daily and Plavix 75 mg daily x3 months and aspirin alone 3. Pain Management: Tylenol as needed  Intermittent Left sided chest/rib pain, pain increased with taking a deep breath, pt is in no distress appears comfortable, no exacerbation while in therapy , unremarkable  CXR and EKG , monitor- exam is consistent with rib contusion- improved with  Lidocaine patch  Normal ECHO 1/26 EKG    Borderline ST elevation in lateral leads-  Unchanged vs prior trace 4. Mood: Provide emotional support              -antipsychotic agents: N/A 5. Neuropsych: This patient is ?fully capable of making decisions on his own behalf. 6. Skin/Wound Care:  Routine skin checks  .  7. Fluids/Electrolytes/Nutrition: Routine in and outs.  8.  History of tobacco abuse as well as marijuana.  Provide counseling 9.  Hyperlipidemia.  Cont Lipitor.  10.  Diabetes mellitus.  Hemoglobin A1c 5.4.  SSI.  Patient on no medications prior to admission             Monitor with increased mobility.  11.  3.3 cm right thyroid nodule.  Follow-up as outpatient with ultrasound 12. Bradycardia: asymptomatic NSR on EKG 2/2  2/8: well controlled.  Vitals:   05/29/19 1524 05/30/19 0421  BP: (!) 128/54 116/65  Pulse: 69 76  Resp: 18 16  Temp: 98.5 F (36.9 C) 98.3 F (36.8 C)  SpO2: 98% 98%    13.  Constipation increased Senna S  to 2 po BID - 2 stools yesterday Daily BMs , continent   LOS: 10 days A FACE TO FACE EVALUATION WAS  PERFORMED  Charlett Blake 05/30/2019, 7:58 AM

## 2019-05-30 NOTE — Progress Notes (Signed)
Speech Language Pathology Daily Session Note  Patient Details  Name: Ruben Moore MRN: [id number] Date of Birth: Jan 03, 1946  Today's Date: 05/30/2019 SLP Individual Time: 0802-0900 SLP Individual Time Calculation (min): 58 min  Short Term Goals: Week 2: SLP Short Term Goal 1 (Week 2): STG=LTG due to remaining length of stay  Skilled Therapeutic Interventions: Pt was seen for skilled ST targeting speech and language goals. Pt required overall Min A semantic cues during a naming task in which pt had to generate name of object or place from a list of 3 word clues. Pt then generated names of 11 states within the [place] Mod I. Increased Mod A semantic cues required for pt to generate names of 10 sports. Pt able to express his basic needs throughout session and also speak about his family and discharge plans with overall Supervision A verbal cues for clarification, which is good evidence he is using word finding strategies in a very functional way to communicate wants, needs, and for social purposes. However, he continues to become frustrated during structure naming tasks in which there is no substitution for a word. Pt is also 90-95% intelligible at the conversation level with overall Supervision  A verbal cues for use of slow rate and overarticulation intelligibility strategies. Pt left in bed with alarm set and needs within reach. Continue per current plan of care.       Pain Pain Assessment Pain Scale: 0-10 Pain Score: 0-No pain  Therapy/Group: Individual Therapy  Arbutus Leas 05/30/2019, 11:21 AM

## 2019-05-30 NOTE — Plan of Care (Signed)
  Problem: RH BOWEL ELIMINATION Goal: RH STG MANAGE BOWEL WITH ASSISTANCE Description: STG Manage Bowel with Assistance. Outcome: Progressing Goal: RH STG MANAGE BOWEL W/MEDICATION W/ASSISTANCE Description: STG Manage Bowel with Medication with Assistance. Min Outcome: Progressing   Problem: RH BLADDER ELIMINATION Goal: RH STG MANAGE BLADDER WITH ASSISTANCE Description: STG Manage Bladder With min Assistance Outcome: Progressing Goal: RH STG MANAGE BLADDER WITH EQUIPMENT WITH ASSISTANCE Description: STG Manage Bladder With Equipment With Assistance Outcome: Progressing   Problem: RH SKIN INTEGRITY Goal: RH STG SKIN FREE OF INFECTION/BREAKDOWN Description: Skin remains free of breakdown and infection - mod I Outcome: Progressing Goal: RH STG MAINTAIN SKIN INTEGRITY WITH ASSISTANCE Description: STG Maintain Skin Integrity With Assistance. Mod I Outcome: Progressing   Problem: RH SAFETY Goal: RH STG ADHERE TO SAFETY PRECAUTIONS W/ASSISTANCE/DEVICE Description: STG Adhere to Safety Precautions With Assistance/Device. Mod I Outcome: Progressing Goal: RH STG DECREASED RISK OF FALL WITH ASSISTANCE Description: STG Decreased Risk of Fall With Assistance. Mod I Outcome: Progressing   Problem: RH PAIN MANAGEMENT Goal: RH STG PAIN MANAGED AT OR BELOW PT'S PAIN GOAL Description: No pain or less than 3 Outcome: Progressing
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
## 2019-05-30 NOTE — Progress Notes (Signed)
Occupational Therapy Session Note  Patient Details  Name: Ruben Moore MRN: [id number] Date of Birth: 22-Nov-1945  Today's Date: 05/30/2019 OT Individual Time: 1300-1400 and [id number] OT Individual Time Calculation (min): 60 min and 34 min  Short Term Goals: Week 2:  OT Short Term Goal 1 (Week 2): STGS=LTGs secondary to upcoming discharge  Skilled Therapeutic Interventions/Progress Updates:    Pt greeted EOB eating lunch, dtr Felicia present. Discussed with dtr expectations for family education this week. She had multiple questions which were answered, took her down to therapy apartment and demonstrated TTB transfer using RW, which she will practice with pt on Wednesday during family ed. Discussed purchase of nonslip shower treads and having a chair with armrests nearby so he can safely dress, also discussed need for nonslip footwear during all mobility and 24/7 supervision during all aspects of self care, including showers. She was agreeable to 3:1 and TTB for home DME. We also reviewed pts 4 HEPs for his R UE, encouraged daily engagement for NMR post d/c. When pt finished eating, he reported wanting to brush his teeth. Steady assist for ambulatory transfer to sink using RW. With vcs, pt used Rt hand to reach for toothbrush and brush teeth with toothbrush. Afterwards pt ambulated to the therapy apartment with steady-supervision assist using RW. Worked on Rt knee stability and Rt NMR while he washed the dishes. Pt able to pump soap onto wash cloth and scrub plates with Rt hand. He did need assist from Lt when moving plates from one side of the sink to the other side. Verbal and tactile cues provided for soft stable knee due to hyperextension tendency on the Rt side. After a seated rest break, pt ambulated back to his room and transferred to the recliner. Had him fold 3 towels with both hands and then 3 wash cloths with the Rt only. Pt often lost Rt grip on the fabric during task but was able to create  even folds, even when folding wash cloths. At end of session OT set him up with his HEPs, as he states he just doesn't remember to use them. Pt left in the recliner with chair alarm set and call bell on bed, working on his theraputty.   2nd Session 1:1 tx (34 min) Pt greeted in the recliner with no c/o pain, wanting to use the bathroom. Ambulatory transfer completed using RW with close supervision, CGA for walker mgt in front of toilet as pt wanted to urinate and stand. OT educated him on rolling RW over toilet to do so. After handwashing at the sink, he ambulated down to [place] to view the new room he was moving to. Vcs for forward gaze and Rt knee stability during ambulation with device. Pt agreed to move to new room and then ambulated back to his room, able to pathfind without cuing. For NMR, had pt fasten 3 buttons with only the Rt hand. He required max encouragement, vcs for problem solving, and Mod A to meet demands of task. Pt continues to require emotional support when working on his Rt hand due to low frustration tolerance. Pt left in care of staff to transfer him over to [place].   Therapy Documentation Precautions:  Precautions Precautions: Fall Precaution Comments: Rt hemiplegia Restrictions Weight Bearing Restrictions: No Vital Signs: Therapy Vitals Temp: 97.7 F (36.5 C) Pulse Rate: 70 Resp: 16 BP: 133/64 Patient Position (if appropriate): Sitting Oxygen Therapy SpO2: 100 % O2 Device: Room Air ADL: ADL Eating: Not assessed Grooming: Moderate assistance  Where Assessed-Grooming: Chair Upper Body Bathing: Supervision/safety Where Assessed-Upper Body Bathing: Edge of bed Lower Body Bathing: Minimal assistance Where Assessed-Lower Body Bathing: Edge of bed Upper Body Dressing: Supervision/safety Where Assessed-Upper Body Dressing: Edge of bed Lower Body Dressing: Moderate assistance Where Assessed-Lower Body Dressing: Edge of bed Toileting: Not assessed Toilet Transfer:  Minimal assistance Toilet Transfer Method: Ambulating(RW) Tub/Shower Transfer: Not assessed     Therapy/Group: Individual Therapy  Laurens Matheny A Daxton Nydam 05/30/2019, 3:50 PM

## 2019-05-31 ENCOUNTER — Inpatient Hospital Stay (HOSPITAL_COMMUNITY): Payer: Non-veteran care

## 2019-05-31 ENCOUNTER — Inpatient Hospital Stay (HOSPITAL_COMMUNITY): Payer: Non-veteran care | Admitting: Speech Pathology

## 2019-05-31 ENCOUNTER — Inpatient Hospital Stay (HOSPITAL_COMMUNITY): Payer: Non-veteran care | Admitting: Physical Therapy

## 2019-05-31 LAB — GLUCOSE, CAPILLARY
Glucose-Capillary: 101 mg/dL — ABNORMAL HIGH (ref 70–99)
Glucose-Capillary: 111 mg/dL — ABNORMAL HIGH (ref 70–99)
Glucose-Capillary: 123 mg/dL — ABNORMAL HIGH (ref 70–99)
Glucose-Capillary: 110 mg/dL — ABNORMAL HIGH (ref 70–99)

## 2019-05-31 NOTE — Progress Notes (Signed)
Occupational Therapy Session Note  Patient Details  Name: Ruben Moore MRN: [id number] Date of Birth: 1946/04/05  Today's Date: 05/31/2019 OT Individual Time: [id number] OT Individual Time Calculation (min): 55 min    Short Term Goals: Week 2:  OT Short Term Goal 1 (Week 2): STGS=LTGs secondary to upcoming discharge  Skilled Therapeutic Interventions/Progress Updates:    Pt resting in recliner upon arrival.  Pt declined bathing this morning but wanted to don clothing.  Pt donned shirt and pants with supervision.  Pt amb with RW to gym with CGA. Pt commented that he "was not excited" about d/c 2/12 because he wasn't able to feed himself.  Care Tool reflects that he is independence.  OT intervention with focus on RUE gross and [facility] tasks.  Pt overall strength WFL.  FMC/gross motor deficits and absent sensation impact ability to complete open chain tasks with RUE (pipe tree, pegs and peg board, and clothes pins). Pt returned to room and remained in recliner with all needs within reach belt alarm activated.   Therapy Documentation Precautions:  Precautions Precautions: Fall Precaution Comments: Rt hemiplegia Restrictions Weight Bearing Restrictions: No   Pain: Pain Assessment Pain Scale: 0-10 Pain Score: 0-No pain   Therapy/Group: Individual Therapy  Leroy Libman 05/31/2019, 10:00 AM

## 2019-05-31 NOTE — Progress Notes (Addendum)
[place] PHYSICAL MEDICINE & REHABILITATION PROGRESS NOTE   Subjective/Complaints: Bladder and bowels  doing ok No nursing issues, pt did not sleep wel lbut said it was just that he was not tired  ROS- denies abd pain, , SOB  Objective:   No results found. No results for input(s): WBC, HGB, HCT, PLT in the last 72 hours. No results for input(s): NA, K, CL, CO2, GLUCOSE, BUN, CREATININE, CALCIUM in the last 72 hours.  Intake/Output Summary (Last 24 hours) at 05/31/2019 0738 Last data filed at 05/31/2019 0420 Gross per 24 hour  Intake 673 ml  Output 650 ml  Net 23 ml     Physical Exam: Vital Signs Blood pressure 116/64, pulse 75, temperature 98.7 F (37.1 C), temperature source Oral, resp. rate 16, height 6\' 3"  (1.905 m), weight 74.1 kg, SpO2 96 %. Vitals reviewed. Constitutional: aphasic; did say "no when asked if any issues". ; but was appropriate, NAD  HENT:  Head: Normocephalic and atraumatic.  Eyes: EOM are normal. Right eye exhibits no discharge. Left eye exhibits no discharge.  Neck: No tracheal deviation present. No thyromegaly present.  Respiratory: Effort normal. No stridor. No respiratory distress. Lungs clear Heart- RRR no ubs murmur or ES  GI: Soft. He exhibits no distension. No epigastric tenderness  Musculoskeletal:     Comments: there is left parasternal and  lateral rib tenderness in mid axillary line just inferior to axilla   Neurological: He is alert and oriented to person, place, and time.  Patient is alert in no acute distress.   Follows commands and makes good eye contact with examiner.   Dysarthria, word finding difficulties.  Fair awareness of his deficits. Right facial weakness Motor: Right upper extremity: 3+/5 proximal distal Right lower extremity: 4-4+/5 proximal distal- pt requires some cues to activate Left upper extremity/left lower extremity: 5/5 proximal distal Expressive aphasia. Difficulty with naming tasks.  Skin: Skin is warm and dry.  Bleeding from small area in right lower abdomen; dressing applied. Minimal drainage at this time.  Psychiatric: He has a normal mood and affect. His behavior is normal.     Assessment/Plan: 1. 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 which require 3+ hours per day of interdisciplinary therapy in a comprehensive inpatient rehab setting.  Physiatrist is providing close team supervision and 24 hour management of active medical problems listed below.  Physiatrist and rehab team continue to assess barriers to discharge/monitor patient progress toward functional and medical goals  Care Tool:  Bathing  Bathing activity did not occur: Refused Body parts bathed by patient: Right arm, Left arm, Chest, Abdomen, Front perineal area, Buttocks, Right upper leg, Left upper leg, Right lower leg, Left lower leg, Face         Bathing assist Assist Level: Contact Guard/Touching assist(vcs for holding grab bar with Rt hand during dynamic sitting for improved stability)     Upper Body Dressing/Undressing Upper body dressing   What is the patient wearing?: Pull over shirt    Upper body assist Assist Level: Supervision/Verbal cueing    Lower Body Dressing/Undressing Lower body dressing      What is the patient wearing?: Pants     Lower body assist Assist for lower body dressing: Minimal Assistance - Patient > 75%     Toileting Toileting Toileting Activity did not occur Landscape architect and hygiene only): Refused  Toileting assist Assist for toileting: Minimal Assistance - Patient > 75%     Transfers Chair/bed transfer  Transfers assist  Chair/bed transfer assist level: Minimal Assistance - Patient > 75%     Locomotion Ambulation   Ambulation assist      Assist level: Minimal Assistance - Patient > 75% Assistive device: Hand held assist Max distance: 75'   Walk 10 feet activity   Assist     Assist level: Minimal Assistance - Patient > 75% Assistive  device: Hand held assist   Walk 50 feet activity   Assist Walk 50 feet with 2 turns activity did not occur: Safety/medical concerns  Assist level: Minimal Assistance - Patient > 75% Assistive device: Walker-rolling    Walk 150 feet activity   Assist Walk 150 feet activity did not occur: Safety/medical concerns  Assist level: Contact Guard/Touching assist Assistive device: Walker-rolling    Walk 10 feet on uneven surface  activity   Assist Walk 10 feet on uneven surfaces activity did not occur: Safety/medical concerns         Wheelchair     Assist   Type of Wheelchair: Manual    Wheelchair assist level: Minimal Assistance - Patient > 75% Max wheelchair distance: 150    Wheelchair 50 feet with 2 turns activity    Assist        Assist Level: Minimal Assistance - Patient > 75%   Wheelchair 150 feet activity     Assist      Assist Level: Minimal Assistance - Patient > 75%   Blood pressure 116/64, pulse 75, temperature 98.7 F (37.1 C), temperature source Oral, resp. rate 16, height 6\' 3"  (1.905 m), weight 74.1 kg, SpO2 96 %.  Medical Problem List and Plan: 1.  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       Continue CIR PT, OT, SLP  2.  Antithrombotics: -DVT/anticoagulation: Lovenox             -antiplatelet therapy: Aspirin 325 mg daily and Plavix 75 mg daily x3 months and aspirin alone 3. Pain Management: Tylenol as needed  Intermittent Left sided chest/rib pain, pain increased with taking a deep breath, pt is in no distress appears comfortable, no exacerbation while in therapy , unremarkable  CXR and EKG , monitor- exam is consistent with rib contusion- improved with  Lidocaine patch  Normal ECHO 1/26 EKG    Borderline ST elevation in lateral leads-  Unchanged vs prior trace 4. Mood: Provide emotional support               -antipsychotic agents: N/A 5. Neuropsych: This patient is ?fully capable of making decisions on his own behalf. 6. Skin/Wound Care: Routine skin checks  .  7. Fluids/Electrolytes/Nutrition: Routine in and outs.  8.  History of tobacco abuse as well as marijuana.  Provide counseling 9.  Hyperlipidemia.  Cont Lipitor.  10.  Diabetes mellitus.  Hemoglobin A1c 5.4.  SSI.  Patient on no medications prior to admission             Monitor with increased mobility.  11.  3.3 cm right thyroid nodule.  Follow-up as outpatient with ultrasound 12. Bradycardia: asymptomatic NSR on EKG 2/2  2/9: well controlled.  Vitals:   05/30/19 1932 05/31/19 0422  BP: (!) 116/52 116/64  Pulse: 66 75  Resp: 19 16  Temp: 98.5 F (36.9 C) 98.7 F (37.1 C)  SpO2: 100% 96%    13.  Constipation increased Senna S  to 2 po BID - 2  stools yesterday Daily BMs , continent   LOS: 11 days A FACE TO [place] E Octa Uplinger 05/31/2019, 7:38 AM

## 2019-05-31 NOTE — Progress Notes (Signed)
Speech Language Pathology Daily Session Note  Patient Details  Name: Ruben Moore MRN: [id number] Date of Birth: 07-11-1945  Today's Date: 05/31/2019 SLP Individual Time: [id number] SLP Individual Time Calculation (min): 41 min  Short Term Goals: Week 2: SLP Short Term Goal 1 (Week 2): STG=LTG due to remaining length of stay  Skilled Therapeutic Interventions: Pt was seen for skilled ST targeting communication goals. Pt required overall Min A semantic or visual cues for word finding in order to verbally describe and write object descriptions and/or functions. Mod A semantic function and/or descriptor cues during generative naming tasks (categories included clothing and breakfast foods). Pt's speech was 90% intelligible at the sentence level without any need for interventions. He is aware of verbal errors for word finding and intelligibility ~75% of the time. He continues to express his basic wants and needs and use word finding strategies during informal conversation with Supervision-Min A verbal cues for clarification. Pt left sitting in wheelchair with alarm set and needs within reach. Continue per current plan of care.       Pain Pain Assessment Pain Scale: 0-10 Pain Score: 0-No pain  Therapy/Group: Individual Therapy  Arbutus Leas 05/31/2019, 3:20 PM

## 2019-05-31 NOTE — Progress Notes (Signed)
Physical Therapy Session Note  Patient Details  Name: Ruben Moore MRN: [id number] Date of Birth: November 13, 1945  Today's Date: 05/31/2019 PT Individual Time: 6168-3729 PT Individual Time Calculation (min): 70 min   Short Term Goals: Week 2:  PT Short Term Goal 1 (Week 2): STG=LTG due to ELOS  Skilled Therapeutic Interventions/Progress Updates: Pt presented in recliner agreeable to therapy. Pt denies pain at start of session. Pt ambulated to rehab gym supervision with RW. Participated in agility ladder stepping forward and side stepping without AD. Pt noted to have improved control of R knee with less occurences of GR. Ambulated to stairs no AD approx 43f CGA. Participated in ascending/descending x 4 stairs. First bout with 2 rails and step to pattern, second bout with R rail only. Pt required cues for sequencing/leading with appropriate LE. Returned to mat in same manner as prior however with increased fatigue pt noted to have increased GR. Provided therapeutic rest while providing pt edu regarding energy conservation and fall recovery. Pt performed fall recovery with use of high/low mat with CGA. Performed LE strengthening via forced use RLE 4 in step ups with RW 2 x 5. With minimal cues pt was able to step with fair control of RLE. Pt with noted fatigue after step ups with PTA discussing energy conservation techniques upon d/c. Pt then participated in supine therex via forced use RLE including SLR 2 x 10, SL bridges 2 x 10.  Pt ambulated back to room with RW and supervision at end of session and left in recliner with belt alarm on, call bell within reach and needs met.      Therapy Documentation Precautions:  Precautions Precautions: Fall Precaution Comments: Rt hemiplegia Restrictions Weight Bearing Restrictions: No General:   Vital Signs: Therapy Vitals Temp: (!) 97.5 F (36.4 C) Pulse Rate: 67 Resp: 18 BP: 121/63 Patient Position (if appropriate): Sitting Oxygen Therapy SpO2: 100  % O2 Device: Room Air  Therapy/Group: Individual Therapy  Kie Calvin  Rexanna Louthan, PTA  05/31/2019, 1:10 PM

## 2019-05-31 NOTE — Progress Notes (Signed)
Occupational Therapy Session Note  Patient Details  Name: Ajeet Avalon MRN: [id number] Date of Birth: [birth year]  Today's Date: 05/31/2019 OT Individual Time: 1300-1325 OT Individual Time Calculation (min): 25 min    Short Term Goals: Week 2:  OT Short Term Goal 1 (Week 2): STGS=LTGs secondary to upcoming discharge  Skilled Therapeutic Interventions/Progress Updates:    Pt resting in recliner upon arrival.  OT intervention with focus on RUE [facility] tasks.  Pt issued theraputty with small beads embedded.  Pt challenged with removing beads using RUE. Pt 50% complete and became frustrated.  Activity graded so that pt could use BUE/hands. Pt tearful at end of session.  Pt remained in recliner with all needs within reach and belt alarm activated.   Therapy Documentation Precautions:  Precautions Precautions: Fall Precaution Comments: Rt hemiplegia Restrictions Weight Bearing Restrictions: No Pain:  Pt denies pain this afternoon   Therapy/Group: Individual Therapy  Leroy Libman 05/31/2019, 2:44 PM

## 2019-06-01 ENCOUNTER — Encounter (HOSPITAL_COMMUNITY): Payer: Non-veteran care | Admitting: Speech Pathology

## 2019-06-01 ENCOUNTER — Ambulatory Visit (HOSPITAL_COMMUNITY): Payer: Non-veteran care | Admitting: Physical Therapy

## 2019-06-01 ENCOUNTER — Ambulatory Visit (HOSPITAL_COMMUNITY): Payer: Non-veteran care | Admitting: *Deleted

## 2019-06-01 ENCOUNTER — Encounter (HOSPITAL_COMMUNITY): Payer: Non-veteran care | Admitting: Occupational Therapy

## 2019-06-01 LAB — GLUCOSE, CAPILLARY
Glucose-Capillary: 111 mg/dL — ABNORMAL HIGH (ref 70–99)
Glucose-Capillary: 92 mg/dL (ref 70–99)
Glucose-Capillary: 74 mg/dL (ref 70–99)
Glucose-Capillary: 91 mg/dL (ref 70–99)

## 2019-06-01 NOTE — Progress Notes (Signed)
[place] PHYSICAL MEDICINE & REHABILITATION PROGRESS NOTE   Subjective/Complaints:   Pt states he has 8 steps at home  No issues overnite or in therapy yesterday   ROS- denies abd pain, , SOB  Objective:   No results found. No results for input(s): WBC, HGB, HCT, PLT in the last 72 hours. No results for input(s): NA, K, CL, CO2, GLUCOSE, BUN, CREATININE, CALCIUM in the last 72 hours.  Intake/Output Summary (Last 24 hours) at 06/01/2019 0829 Last data filed at 06/01/2019 0726 Gross per 24 hour  Intake 617 ml  Output --  Net 617 ml     Physical Exam: Vital Signs Blood pressure 114/68, pulse 79, temperature 98.3 F (36.8 C), resp. rate 18, height '6\' 3"'$  (1.905 m), weight 74.1 kg, SpO2 98 %. Vitals reviewed. Constitutional: aphasic; did say "no when asked if any issues". ; but was appropriate, NAD  HENT:  Head: Normocephalic and atraumatic.  Eyes: EOM are normal. Right eye exhibits no discharge. Left eye exhibits no discharge.  Neck: No tracheal deviation present. No thyromegaly present.  Respiratory: Effort normal. No stridor. No respiratory distress. Lungs clear Heart- RRR no ubs murmur or ES  GI: Soft. He exhibits no distension. No epigastric tenderness  Musculoskeletal:     Comments: there is left parasternal and  lateral rib tenderness in mid axillary line just inferior to axilla   Neurological: He is alert and oriented to person, place, and time.  Patient is alert in no acute distress.    Dysarthria, word finding difficulties.  Fair awareness of his deficits. Right facial weakness Motor: Right upper extremity: 3+/5 proximal distal Right lower extremity: 4-4+/5 proximal distal- pt requires some cues to activate Left upper extremity/left lower extremity: 5/5 proximal distal Expressive aphasia. Difficulty with naming tasks.  Skin: Skin is warm and dry. Bleeding from small area in right lower abdomen; dressing applied. Minimal drainage at this time.  Psychiatric: He  has a normal mood and affect. His behavior is normal.     Assessment/Plan: 1. 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 which require 3+ hours per day of interdisciplinary therapy in a comprehensive inpatient rehab setting.  Physiatrist is providing close team supervision and 24 hour management of active medical problems listed below.  Physiatrist and rehab team continue to assess barriers to discharge/monitor patient progress toward functional and medical goals  Care Tool:  Bathing  Bathing activity did not occur: Refused Body parts bathed by patient: Right arm, Left arm, Chest, Abdomen, Front perineal area, Buttocks, Right upper leg, Left upper leg, Right lower leg, Left lower leg, Face         Bathing assist Assist Level: Contact Guard/Touching assist(vcs for holding grab bar with Rt hand during dynamic sitting for improved stability)     Upper Body Dressing/Undressing Upper body dressing   What is the patient wearing?: Pull over shirt    Upper body assist Assist Level: Set up assist    Lower Body Dressing/Undressing Lower body dressing      What is the patient wearing?: Pants     Lower body assist Assist for lower body dressing: Supervision/Verbal cueing     Toileting Toileting Toileting Activity did not occur (Clothing management and hygiene only): Refused  Toileting assist Assist for toileting: Minimal Assistance - Patient > 75%     Transfers Chair/bed transfer  Transfers assist     Chair/bed transfer assist level: Minimal Assistance - Patient > 75%     Locomotion Ambulation   Ambulation  assist      Assist level: Minimal Assistance - Patient > 75% Assistive device: Hand held assist Max distance: 75'   Walk 10 feet activity   Assist     Assist level: Minimal Assistance - Patient > 75% Assistive device: Hand held assist   Walk 50 feet activity   Assist Walk 50 feet with 2 turns activity did not occur: Safety/medical  concerns  Assist level: Minimal Assistance - Patient > 75% Assistive device: Walker-rolling    Walk 150 feet activity   Assist Walk 150 feet activity did not occur: Safety/medical concerns  Assist level: Contact Guard/Touching assist Assistive device: Walker-rolling    Walk 10 feet on uneven surface  activity   Assist Walk 10 feet on uneven surfaces activity did not occur: Safety/medical concerns         Wheelchair     Assist   Type of Wheelchair: Manual    Wheelchair assist level: Minimal Assistance - Patient > 75% Max wheelchair distance: 150    Wheelchair 50 feet with 2 turns activity    Assist        Assist Level: Minimal Assistance - Patient > 75%   Wheelchair 150 feet activity     Assist      Assist Level: Minimal Assistance - Patient > 75%   Blood pressure 114/68, pulse 79, temperature 98.3 F (36.8 C), resp. rate 18, height '6\' 3"'$  (1.905 m), weight 74.1 kg, SpO2 98 %.  Medical Problem List and Plan: 1.  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       Continue CIR PT, OT, SLP  Team conference today please see physician documentation under team conference tab, met with team  to discuss problems,progress, and goals. Formulized individual treatment plan based on medical history, underlying problem and comorbidities. 2.  Antithrombotics: -DVT/anticoagulation: Lovenox             -antiplatelet therapy: Aspirin 325 mg daily and Plavix 75 mg daily x3 months and aspirin alone 3. Pain Management: Tylenol as needed  Intermittent Left sided chest/rib pain, pain increased with taking a deep breath, pt is in no distress appears comfortable, no exacerbation while in therapy , unremarkable  CXR and EKG , monitor- exam is consistent with rib contusion- improved with  Lidocaine patch  Normal ECHO 1/26 EKG    Borderline ST elevation in lateral  leads-  Unchanged vs prior trace 4. Mood: Provide emotional support              -antipsychotic agents: N/A 5. Neuropsych: This patient is ?fully capable of making decisions on his own behalf. 6. Skin/Wound Care: Routine skin checks  .  7. Fluids/Electrolytes/Nutrition: Routine in and outs.  8.  History of tobacco abuse as well as marijuana.  Provide counseling 9.  Hyperlipidemia.  Cont Lipitor.  10.  Diabetes mellitus.  Hemoglobin A1c 5.4.  SSI.  Patient on no medications prior to admission             Monitor with increased mobility.  11.  3.3 cm right thyroid nodule.  Follow-up as outpatient with ultrasound 12. Bradycardia: asymptomatic NSR on EKG 2/2  2/10: well controlled.  Vitals:   05/31/19 1947 06/01/19 0404  BP: 121/60 114/68  Pulse: 68 79  Resp: 17 18  Temp: 98.3 F (36.8 C) 98.3 F (36.8 C)  SpO2: 97% 98%    13.  Constipation increased  Senna S  to 2 po BID - 2 stools yesterday Daily BMs , continent   LOS: 12 days A FACE TO FACE EVALUATION WAS PERFORMED  Charlett Blake 06/01/2019, 8:29 AM

## 2019-06-01 NOTE — Progress Notes (Signed)
   The overall goal for the admission was met for:   Discharge location: Home with daughter coming in to assist  Length of Stay: 14 days with discharge set for 06/03/19  Discharge activity level: Supervision level overall  Home/community participation: Limited community activity  Services provided included: MD, RD, PT, OT, SLP, RN, CM, Pharmacy, [place]: Medicare and Other: [facility]  Follow-up services arranged: Home Health: PT< OT< SP from [name], DME: RW, 3n1, TTB and Patient/Family has no preference for HH/DME agencies  Comments (or additional information):[name] [phone] Equipment from the [facility] of [place]  Patient/Family verbalized understanding of follow-up arrangements: Yes  Individual responsible for coordination of the follow-up plan: Daughter Jule Schlabach [phone]  Confirmed correct DME delivered:Equipment to be delivered to the patient's home this afternoon by [facility], Dalbert Batman 06/01/2019    Margarito Liner

## 2019-06-01 NOTE — Progress Notes (Signed)
Physical Therapy Session Note  Patient Details  Name: Ruben Moore MRN: [id number] Date of Birth: 05-30-1945  Today's Date: 06/01/2019 PT Individual Time: 9449-6759 PT Individual Time Calculation (min): 57 min   Short Term Goals: Week 2:  PT Short Term Goal 1 (Week 2): STG=LTG due to ELOS  Skilled Therapeutic Interventions/Progress Updates:   Pt received sitting in WC and agreeable to PT. Pt reports need for urination. Ambulatory transfer to toilet without AD and supervision assist. Pt able to complete urination standing at toilet with distant supervision assist. RT then present for co-treat. Pt transported to gift shop of hospital. Gait training in simulated community environment with RW 2 x 258f and without AD 2x 2068f Supervision assist overall with several small LOB noted without AD, but able to correct without Assist from PT. Pt also performed gait training over cement sidewalk with RW x [id number] supervision assist with min cues for AD management over cracks and around turns in tight space. Pt transported back to rehab unit. WC mobility with BUE to force use of RUE x [id number] then instructed pt in fall recovery with supervision assist and extensive education on safe fall recovery and when to call for help to prevent further health risk. Patient returned to [facility] and [facility] and all needs met.         Therapy Documentation Precautions:  Precautions Precautions: Fall Precaution Comments: Rt hemiplegia Restrictions Weight Bearing Restrictions: No    Vital Signs: Therapy Vitals Temp: (!) 97.5 F (36.4 C) Pulse Rate: 71 Resp: 17 BP: 128/61 Patient Position (if appropriate): Sitting Oxygen Therapy SpO2: 100 % O2 Device: Room Air Pain:   denies at rest.   Therapy/Group: Individual Therapy  AustLorie Phenix0/2021, 3:16 PM

## 2019-06-01 NOTE — Progress Notes (Signed)
Occupational Therapy Session Note  Patient Details  Name: Ruben Moore MRN: [id number] Date of Birth: 09-Apr-1946  Today's Date: 06/01/2019 OT Individual Time: 1030-1115 OT Individual Time Calculation (min): 45 min    Short Term Goals: Week 2:  OT Short Term Goal 1 (Week 2): STGS=LTGs secondary to upcoming discharge  Skilled Therapeutic Interventions/Progress Updates:    Upon entering the room, pt seated in recliner chair with daughter present for hands on family education. OT reviewing goals and recommendation for 24/7 supervision for safety with mobility and self care tasks. Caregiver demonstrated understanding by providing supervision with mobility during session. Pt ambulating with RW 75' to day room and demonstrated transfer onto TTB in tub shower. OT educating pt and caregiver on additional fall risks and recommended safety treads within the tub. Pt ambulating back to room in same manner as above and returned to recliner chair. Pt demonstrating HEP for R UE with use of written HEP, theraputty, and resistive blocks. Pt needing min cuing for proper technique. OT also reviewing other ways to work on coordination and strength with every day items found at home and with self care tasks. Caregiver verbalized understanding. Caregiver with no further questions at this time. OT did also review recommendations for HHOT with she was agreeable to as well.   Therapy Documentation Precautions:  Precautions Precautions: Fall Precaution Comments: Rt hemiplegia Restrictions Weight Bearing Restrictions: No Pain: Pain Assessment Pain Scale: 0-10 Pain Score: 0-No pain ADL: ADL Eating: Not assessed Grooming: Moderate assistance Where Assessed-Grooming: Chair Upper Body Bathing: Supervision/safety Where Assessed-Upper Body Bathing: Edge of bed Lower Body Bathing: Minimal assistance Where Assessed-Lower Body Bathing: Edge of bed Upper Body Dressing: Supervision/safety Where Assessed-Upper Body  Dressing: Edge of bed Lower Body Dressing: Moderate assistance Where Assessed-Lower Body Dressing: Edge of bed Toileting: Not assessed Toilet Transfer: Minimal assistance Toilet Transfer Method: Ambulating(RW) Tub/Shower Transfer: Not assessed   Therapy/Group: Individual Therapy  [name] 06/01/2019, 12:40 PM

## 2019-06-01 NOTE — Progress Notes (Signed)
Speech Language Pathology Daily Session Note  Patient Details  Name: Ruben Moore MRN: [id number] Date of Birth: April 07, 1946  Today's Date: 06/01/2019 SLP Individual Time: 0900-1000 SLP Individual Time Calculation (min): 60 min  Short Term Goals: Week 2: SLP Short Term Goal 1 (Week 2): STG=LTG due to remaining length of stay  Skilled Therapeutic Interventions: Pt was seen for skilled ST targeting education with pt and his daughter (primary caregiver) and targeting communication goals. SLP facilitated session with functional conversation regarding pt's goals and progress throughout session. Definition of aphasia and dysarthria provided, as well as verbal explanation and handout regarding verbal and visual cues to assist with functional communication. Emphasis on allowing extra time for responses as well as semantic and phonemic cues - demonstration of cueing techniques provided and daughter demonstrated use of semantic cues X2 throughout session. She was receptive to all educational opportunities throughout session. Pt generated names within familiar and unfamiliar categories (abstract X1 and concrete X1) with overall Min A semantic cues. Pt verbally identified object names with Supervision A when provided 3 descriptive word clues. During a picture description task, pt described actions and made inferences regarding themes within the picture with overall Supervision A question cues. Pt left sitting in wheelchair with seat alarm in place and needs met, all answered from him and his daughter were answered to their satisfaction. Continue per current plan of care.       Pain Pain Assessment Pain Scale: 0-10 Pain Score: 0-No pain  Therapy/Group: Individual Therapy  Arbutus Leas 06/01/2019, 10:23 AM

## 2019-06-01 NOTE — Plan of Care (Signed)
  Problem: RH BOWEL ELIMINATION Goal: RH STG MANAGE BOWEL WITH ASSISTANCE Description: STG Manage Bowel with mod I Assistance. Outcome: Progressing Goal: RH STG MANAGE BOWEL W/MEDICATION W/ASSISTANCE Description: STG Manage Bowel with Medication with Assistance. Min Outcome: Progressing   Problem: RH BLADDER ELIMINATION Goal: RH STG MANAGE BLADDER WITH ASSISTANCE Description: STG Manage Bladder With min Assistance Outcome: Progressing   Problem: RH SKIN INTEGRITY Goal: RH STG SKIN FREE OF INFECTION/BREAKDOWN Description: Skin remains free of breakdown and infection - mod I Outcome: Progressing Goal: RH STG MAINTAIN SKIN INTEGRITY WITH ASSISTANCE Description: STG Maintain Skin Integrity With Assistance. Mod I Outcome: Progressing   Problem: RH SAFETY Goal: RH STG ADHERE TO SAFETY PRECAUTIONS W/ASSISTANCE/DEVICE Description: STG Adhere to Safety Precautions With Assistance/Device. Mod I Outcome: Progressing Goal: RH STG DECREASED RISK OF FALL WITH ASSISTANCE Description: STG Decreased Risk of Fall With Assistance. Mod I Outcome: Progressing   Problem: RH PAIN MANAGEMENT Goal: RH STG PAIN MANAGED AT OR BELOW PT'S PAIN GOAL Description: No pain or less than 3 Outcome: Progressing

## 2019-06-01 NOTE — Progress Notes (Signed)
Physical Therapy Session Note  Patient Details  Name: Ruben Moore MRN: [id number] Date of Birth: [birth year]  Today's Date: 06/01/2019 PT Individual Time: 1115-1200 PT Individual Time Calculation (min): 45 min   Short Term Goals: Week 2:  PT Short Term Goal 1 (Week 2): STG=LTG due to ELOS  Skilled Therapeutic Interventions/Progress Updates: Pt presented in recliner with daughter present agreeable to therapy. Session focused on family education. Pt ambulated to ortho gym supervision level with RW and performed car transfer to compact SUV height with supervision. Pt ambulated to rehab gym and educated and performed stairs with no AD and use of RW. Pt initially required mod cues for sequencing on first bout but was able to perform second bout with dgt and CGA with decreased verbal cues. Pt also demonstrated fall recovery with dgt present and was able to get off floor with use of mat for UE support and verbal cues. Pt performed mini obstacle cours walking on mat, 5in curb and weaving through cones with supervision. Pt ambulated back to room with RW and returned to recliner. Daughter felt comfortable providing assist as needed to pt. Pt left with belt alarm on, call bell within reach and needs met.      Therapy Documentation Precautions:  Precautions Precautions: Fall Precaution Comments: Rt hemiplegia Restrictions Weight Bearing Restrictions: No General:   Vital Signs: Therapy Vitals Temp: (!) 97.5 F (36.4 C) Pulse Rate: 71 Resp: 17 BP: 128/61 Patient Position (if appropriate): Sitting Oxygen Therapy SpO2: 100 % O2 Device: Room Air   Therapy/Group: Individual Therapy  Caisen Mangas  Lora Glomski, PTA  06/01/2019, 4:23 PM

## 2019-06-01 NOTE — Discharge Summary (Signed)
Physician Discharge Summary  Patient ID: Ruben Moore MRN: [id number] DOB/AGE: [id number] y.o.  Admit date: 05/20/2019 Discharge date: 06/03/2019  Discharge Diagnoses:  Principal Problem:   Left middle cerebral artery stroke [facility]) DVT prophylaxis History of tobacco abuse as well as marijuana Diabetes mellitus Hyperlipidemia 3.3 cm right thyroid nodule History of bladder cancer Chronic left carotid occlusion  Discharged Condition: Stable  Significant Diagnostic Studies: CT ANGIO HEAD W OR WO CONTRAST  Result Date: 05/17/2019 CLINICAL DATA:  Stroke follow-up. 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 EXAM: CT ANGIOGRAPHY HEAD AND NECK TECHNIQUE: Multidetector CT imaging of the head and neck was performed using the standard protocol during bolus administration of intravenous contrast. Multiplanar CT image reconstructions and MIPs were obtained to evaluate the vascular anatomy. Carotid stenosis measurements (when applicable) are obtained utilizing NASCET criteria, using the distal internal carotid diameter as the denominator. CONTRAST:  73mL OMNIPAQUE IOHEXOL 350 MG/ML SOLN COMPARISON:  Head MRI, head MRA, and neck MRA 05/17/2019 FINDINGS: CTA NECK FINDINGS Aortic arch: Standard 3 vessel aortic arch with mild atherosclerotic plaque. Widely patent arch vessel origins. Right carotid system: Patent with small volume soft plaque in the carotid bulb. No evidence of significant stenosis or dissection. Left carotid system: Patent common and external carotid arteries. Thrombus at the ICA origin with complete occlusion of the ICA in the neck. Vertebral arteries: Patent with the right being moderately dominant. No evidence of significant stenosis or dissection allowing for artifact intermittently limiting assessment of the left V1 segment. Skeleton: Advanced disc degeneration throughout the cervical spine with exception of C2-3. Interbody ankylosis at C4-5 and C5-6. Other neck: 3.3 cm  hypoattenuating right thyroid nodule. Upper chest: Mild centrilobular emphysema. Review of the MIP images confirms the above findings CTA HEAD FINDINGS Anterior circulation: The intracranial right ICA is patent with mild atherosclerotic plaque not resulting in significant stenosis. The intracranial left ICA is occluded proximally with distal reconstitution via the posterior communicating artery. ACAs and MCAs are patent without evidence of proximal branch occlusion or significant proximal stenosis. There is mild asymmetric attenuation of left MCA branch vessels. The left A1 segment is mildly hypoplastic. There is a patent anterior communicating artery. No aneurysm is identified. Posterior circulation: The intracranial vertebral arteries are widely patent to the basilar. Patent PICA and SCA origins are seen bilaterally. AICAs are small and not well evaluated. The basilar artery is widely patent. There are moderately large posterior communicating arteries bilaterally with hypoplasia of the right P1 segment. Both PCAs are patent without evidence of significant proximal stenosis. No aneurysm is identified. Venous sinuses: As permitted by contrast timing, patent. Anatomic variants: Robust posterior communicating arteries with hypoplastic right P1 segment. Mildly hypoplastic left A1 segment. Review of the MIP images confirms the above findings IMPRESSION: 1. 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 2. Widely patent right carotid artery and vertebral arteries. 3. No significant intracranial stenosis. 4. 3.3 cm right thyroid nodule. Recommend thyroid US (ref: J Am Coll Radiol. 2015 Feb;12(2): 143-50). Electronically Signed   By: Logan Bores M.D.   On: 05/17/2019 11:13   DG Chest 2 View  Result Date: 05/24/2019 CLINICAL DATA:  Fall 10 days ago, left rib pain EXAM: CHEST - 2 VIEW COMPARISON:  05/16/2019 FINDINGS: The heart size and mediastinal contours are  within normal limits. Both lungs are clear. The visualized skeletal structures are unremarkable. IMPRESSION: No acute abnormality of the lungs. No displaced fractures or other radiographic  abnormality of the left chest to explain pain. Dedicated rib radiographs or CT may be more sensitive to assess for subtle fractures. Electronically Signed   By: Eddie Candle M.D.   On: 05/24/2019 10:08   CT HEAD WO CONTRAST  Result Date: 05/17/2019 CLINICAL DATA:  73 year old male with increased right side weakness. Left ICA occlusion. Left MCA territory infarcts on brain MRI earlier today. EXAM: CT HEAD WITHOUT CONTRAST TECHNIQUE: Contiguous axial images were obtained from the base of the skull through the vertex without intravenous contrast. COMPARISON:  Brain MRI 0256 hours today.  Head CT 0014 hours today. FINDINGS: Brain: Cytotoxic edema in the left insula and basal ganglia is slightly less apparent since 0014 hours today (series 2, image 16 now versus series 3, image 15 earlier). No associated hemorrhage by CT. No associated mass effect. No new area of infarction is evident by CT. Right hemisphere and posterior fossa gray-white matter differentiation remains within normal limits. No ventriculomegaly. No midline shift, mass effect, or evidence of intracranial mass lesion. Vascular: Calcified atherosclerosis at the skull base. Skull: No acute osseous abnormality identified. Congenital incomplete ossification of the posterior C1 ring. Sinuses/Orbits: Visualized paranasal sinuses and mastoids are stable and well pneumatized. Several mucous retention cysts and small frontal sinus osteoma is again noted. Other: No acute orbit or scalp soft tissue findings. IMPRESSION: 1. No extension of the Left MCA infarct is evident since 0256 hours today. No hemorrhagic transformation by CT, and no mass effect. 2. No new intracranial abnormality. Electronically Signed   By: Genevie Ann M.D.   On: 05/17/2019 19:13   CT HEAD WO  CONTRAST  Result Date: 05/17/2019 CLINICAL DATA:  Dizziness, confusion, blurred vision EXAM: CT HEAD WITHOUT CONTRAST TECHNIQUE: Contiguous axial images were obtained from the base of the skull through the vertex without intravenous contrast. COMPARISON:  None. FINDINGS: Brain: There is low-density noted in the left insular cortex, extending into the left basal ganglia and corona radiata compatible with acute to subacute infarct. No hemorrhage. No midline shift. No hydrocephalus. Vascular: No hyperdense vessel or unexpected calcification. Skull: No acute calvarial abnormality. Sinuses/Orbits: Visualized paranasal sinuses and mastoids clear. Orbital soft tissues unremarkable. Other: None IMPRESSION: Infarct within the left insular cortex, left basal ganglia and corona radiata, likely acute to subacute. Electronically Signed   By: Rolm Baptise M.D.   On: 05/17/2019 00:23   CT ANGIO NECK W OR WO CONTRAST  Result Date: 05/17/2019 CLINICAL DATA:  Stroke follow-up. 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 EXAM: CT ANGIOGRAPHY HEAD AND NECK TECHNIQUE: Multidetector CT imaging of the head and neck was performed using the standard protocol during bolus administration of intravenous contrast. Multiplanar CT image reconstructions and MIPs were obtained to evaluate the vascular anatomy. Carotid stenosis measurements (when applicable) are obtained utilizing NASCET criteria, using the distal internal carotid diameter as the denominator. CONTRAST:  91mL OMNIPAQUE IOHEXOL 350 MG/ML SOLN COMPARISON:  Head MRI, head MRA, and neck MRA 05/17/2019 FINDINGS: CTA NECK FINDINGS Aortic arch: Standard 3 vessel aortic arch with mild atherosclerotic plaque. Widely patent arch vessel origins. Right carotid system: Patent with small volume soft plaque in the carotid bulb. No evidence of significant stenosis or dissection. Left carotid system: Patent common and external carotid arteries. Thrombus at the ICA origin with  complete occlusion of the ICA in the neck. Vertebral arteries: Patent with the right being moderately dominant. No evidence of significant stenosis or dissection allowing for artifact intermittently limiting assessment of the left  V1 segment. Skeleton: Advanced disc degeneration throughout the cervical spine with exception of C2-3. Interbody ankylosis at C4-5 and C5-6. Other neck: 3.3 cm hypoattenuating right thyroid nodule. Upper chest: Mild centrilobular emphysema. Review of the MIP images confirms the above findings CTA HEAD FINDINGS Anterior circulation: The intracranial right ICA is patent with mild atherosclerotic plaque not resulting in significant stenosis. The intracranial left ICA is occluded proximally with distal reconstitution via the posterior communicating artery. ACAs and MCAs are patent without evidence of proximal branch occlusion or significant proximal stenosis. There is mild asymmetric attenuation of left MCA branch vessels. The left A1 segment is mildly hypoplastic. There is a patent anterior communicating artery. No aneurysm is identified. Posterior circulation: The intracranial vertebral arteries are widely patent to the basilar. Patent PICA and SCA origins are seen bilaterally. AICAs are small and not well evaluated. The basilar artery is widely patent. There are moderately large posterior communicating arteries bilaterally with hypoplasia of the right P1 segment. Both PCAs are patent without evidence of significant proximal stenosis. No aneurysm is identified. Venous sinuses: As permitted by contrast timing, patent. Anatomic variants: Robust posterior communicating arteries with hypoplastic right P1 segment. Mildly hypoplastic left A1 segment. Review of the MIP images confirms the above findings IMPRESSION: 1. 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 2. Widely patent right carotid artery and vertebral arteries. 3. No  significant intracranial stenosis. 4. 3.3 cm right thyroid nodule. Recommend thyroid US (ref: J Am Coll Radiol. 2015 Feb;12(2): 143-50). Electronically Signed   By: Logan Bores M.D.   On: 05/17/2019 11:13   MR ANGIO HEAD WO CONTRAST  Result Date: 05/17/2019 CLINICAL DATA:  Initial evaluation for acute stroke, right-sided weakness, dysarthria. EXAM: MRI HEAD WITHOUT CONTRAST MRA HEAD WITHOUT CONTRAST MRA NECK WITHOUT AND WITH CONTRAST TECHNIQUE: Multiplanar, multiecho pulse sequences of the brain and surrounding structures were obtained without intravenous contrast. Angiographic images of the Circle of Willis were obtained using MRA technique without intravenous contrast. Angiographic images of the neck were obtained using MRA technique without and with intravenous contrast. Carotid stenosis measurements (when applicable) are obtained utilizing NASCET criteria, using the distal internal carotid diameter as the denominator. CONTRAST:  7.55mL GADAVIST GADOBUTROL 1 MMOL/ML IV SOLN COMPARISON:  Prior head CT from earlier the same day. FINDINGS: MRI HEAD FINDINGS Brain: Examination mildly degraded by motion artifact. Diffuse prominence of the CSF containing spaces compatible with generalized age-related cerebral atrophy. Patchy T2/FLAIR hyperintensity within the periventricular deep white matter both cerebral hemispheres most consistent with chronic small vessel ischemic disease, mild for age. Multifocal areas of restricted diffusion involving the left insular cortex, left basal ganglia, with a few additional patchy cortical infarcts within the overlying left frontal and temporal lobes, consistent with left MCA territory infarcts. These are acute to early subacute in appearance with associated T2/FLAIR signal abnormality. Associated petechial hemorrhage at the level of the left lentiform nucleus without frank hemorrhagic transformation. Diffusion abnormality/edema extending into the left cerebral peduncle and midbrain  indicative of involvement of the underlying corticospinal tract. No significant mass effect. No other evidence for acute or subacute ischemia. Gray-white matter differentiation otherwise maintained. No encephalomalacia to suggest chronic cortical infarction elsewhere within the brain. No other foci of susceptibility artifact to suggest acute or chronic intracranial hemorrhage. No mass lesion, midline shift or mass effect. No hydrocephalus. No extra-axial fluid collection. Pituitary gland within normal limits. Midline structures intact. Vascular: Loss of normal flow void within the left ICA to  the level of the terminus, which could be related to slow flow and/or occlusion. Major intracranial vascular flow voids otherwise maintained. Skull and upper cervical spine: Craniocervical junction within normal limits. Bone marrow signal intensity normal. No scalp soft tissue abnormality. Sinuses/Orbits: Globes and orbital soft tissues within normal limits. Mild scattered mucosal thickening noted within the ethmoidal sinuses. Small left maxillary sinus retention cyst noted. No significant mastoid effusion. Inner ear structures grossly normal. Other: None. MRA HEAD FINDINGS ANTERIOR CIRCULATION: Examination moderately degraded by motion artifact. 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 Distal cervical segment of the right ICA widely patent with antegrade flow. Petrous, cavernous, and supraclinoid segments demonstrate atheromatous irregularity but are patent without definite flow-limiting stenosis. Both A1 segments widely patent. Normal anterior communicating artery. Anterior cerebral arteries patent to their distal aspects without stenosis. Right M1 widely patent. Perfusion of the left M1 via collateral flow across the circle-of-Willis and/or from the posterior circulation. Left M1 widely patent as well. Grossly negative MCA bifurcations. Distal MCA branches well perfused. Right MCA branches somewhat attenuated as  compared to the contralateral left. POSTERIOR CIRCULATION: Vertebral arteries widely patent to the vertebrobasilar junction without stenosis. Right vertebral artery dominant. Posterior inferior cerebral arteries patent bilaterally. Basilar widely patent to its distal aspect without stenosis. Superior cerebral arteries patent bilaterally. PCA supplied via the basilar as well as robust bilateral posterior communicating arteries. Both PCAs well perfused to their distal aspects without stenosis. No intracranial aneurysm. MRA NECK FINDINGS AORTIC ARCH: Visualized aortic arch of normal caliber with normal 3 vessel morphology. No hemodynamically significant stenosis seen about the origin of the great vessels. Visualized subclavian arteries widely patent. RIGHT CAROTID SYSTEM: Right common carotid artery widely patent from its origin to the bifurcation without stenosis. No significant atheromatous narrowing about the right bifurcation. Right ICA widely patent distally to the skull base without stenosis or vascular occlusion. LEFT CAROTID SYSTEM: Left common carotid artery widely patent from its origin to the bifurcation. 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 Left ICA remains occluded to the circle-of-Willis. VERTEBRAL ARTERIES: Both vertebral arteries arise from the subclavian arteries. Right vertebral artery dominant and widely patent to the skull base. Multifocal moderate to severe stenoses seen involving the proximal left V1 segment, likely atherosclerotic (series 1046, image 13). Left vertebral artery otherwise widely patent distally without stenosis or vascular occlusion. Incidental note made of a 3 cm right thyroid nodule, indeterminate. IMPRESSION: MRI HEAD IMPRESSION: 1. Patchy multifocal acute to early subacute ischemic left MCA territory infarcts involving the left insular cortex, left basal ganglia, and overlying left frontal and temporal lobes as above. No significant  mass effect. 2. Associated petechial hemorrhage at the left lentiform nucleus without frank hemorrhagic transformation. 3. Abnormal flow void within the left ICA to the level of the terminus, consistent with occlusion. See below. 4. Underlying mild chronic microvascular ischemic disease. MRA HEAD IMPRESSION: 1. Motion degraded exam. 2. 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 Left MCA and its branches are perfused via collateral flow across the circle-of-Willis and/or from the posterior circulation. 3. Otherwise grossly negative MRA of the head. No other large vessel occlusion. No hemodynamically significant or correctable stenosis identified. MRA NECK IMPRESSION: 1. 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 2. Wide patency of the right carotid artery system within the neck. 3. 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 Left vertebral  artery otherwise widely patent as is the dominant right vertebral artery. 4. Approximate 3 cm right thyroid nodule, indeterminate. Follow-up examination with dedicated thyroid ultrasound recommended for further evaluation. This could be performed on a nonemergent outpatient basis. Electronically Signed   By: Jeannine Boga M.D.   On: 05/17/2019 04:27   MR ANGIO NECK W WO CONTRAST  Result Date: 05/17/2019 CLINICAL DATA:  Initial evaluation for acute stroke, right-sided weakness, dysarthria. EXAM: MRI HEAD WITHOUT CONTRAST MRA HEAD WITHOUT CONTRAST MRA NECK WITHOUT AND WITH CONTRAST TECHNIQUE: Multiplanar, multiecho pulse sequences of the brain and surrounding structures were obtained without intravenous contrast. Angiographic images of the Circle of Willis were obtained using MRA technique without intravenous contrast. Angiographic images of the neck were obtained using MRA technique without and with intravenous contrast. Carotid stenosis measurements (when applicable) are obtained utilizing NASCET criteria,  using the distal internal carotid diameter as the denominator. CONTRAST:  7.37mL GADAVIST GADOBUTROL 1 MMOL/ML IV SOLN COMPARISON:  Prior head CT from earlier the same day. FINDINGS: MRI HEAD FINDINGS Brain: Examination mildly degraded by motion artifact. Diffuse prominence of the CSF containing spaces compatible with generalized age-related cerebral atrophy. Patchy T2/FLAIR hyperintensity within the periventricular deep white matter both cerebral hemispheres most consistent with chronic small vessel ischemic disease, mild for age. Multifocal areas of restricted diffusion involving the left insular cortex, left basal ganglia, with a few additional patchy cortical infarcts within the overlying left frontal and temporal lobes, consistent with left MCA territory infarcts. These are acute to early subacute in appearance with associated T2/FLAIR signal abnormality. Associated petechial hemorrhage at the level of the left lentiform nucleus without frank hemorrhagic transformation. Diffusion abnormality/edema extending into the left cerebral peduncle and midbrain indicative of involvement of the underlying corticospinal tract. No significant mass effect. No other evidence for acute or subacute ischemia. Gray-white matter differentiation otherwise maintained. No encephalomalacia to suggest chronic cortical infarction elsewhere within the brain. No other foci of susceptibility artifact to suggest acute or chronic intracranial hemorrhage. No mass lesion, midline shift or mass effect. No hydrocephalus. No extra-axial fluid collection. Pituitary gland within normal limits. Midline structures intact. Vascular: Loss of normal flow void within the left ICA to the level of the terminus, which could be related to slow flow and/or occlusion. Major intracranial vascular flow voids otherwise maintained. Skull and upper cervical spine: Craniocervical junction within normal limits. Bone marrow signal intensity normal. No scalp soft tissue  abnormality. Sinuses/Orbits: Globes and orbital soft tissues within normal limits. Mild scattered mucosal thickening noted within the ethmoidal sinuses. Small left maxillary sinus retention cyst noted. No significant mastoid effusion. Inner ear structures grossly normal. Other: None. MRA HEAD FINDINGS ANTERIOR CIRCULATION: Examination moderately degraded by motion artifact. 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 Distal cervical segment of the right ICA widely patent with antegrade flow. Petrous, cavernous, and supraclinoid segments demonstrate atheromatous irregularity but are patent without definite flow-limiting stenosis. Both A1 segments widely patent. Normal anterior communicating artery. Anterior cerebral arteries patent to their distal aspects without stenosis. Right M1 widely patent. Perfusion of the left M1 via collateral flow across the circle-of-Willis and/or from the posterior circulation. Left M1 widely patent as well. Grossly negative MCA bifurcations. Distal MCA branches well perfused. Right MCA branches somewhat attenuated as compared to the contralateral left. POSTERIOR CIRCULATION: Vertebral arteries widely patent to the vertebrobasilar junction without stenosis. Right vertebral artery dominant. Posterior inferior cerebral arteries patent bilaterally. Basilar widely patent to its distal aspect without stenosis. Superior cerebral  arteries patent bilaterally. PCA supplied via the basilar as well as robust bilateral posterior communicating arteries. Both PCAs well perfused to their distal aspects without stenosis. No intracranial aneurysm. MRA NECK FINDINGS AORTIC ARCH: Visualized aortic arch of normal caliber with normal 3 vessel morphology. No hemodynamically significant stenosis seen about the origin of the great vessels. Visualized subclavian arteries widely patent. RIGHT CAROTID SYSTEM: Right common carotid artery widely patent from its origin to the bifurcation without stenosis. No  significant atheromatous narrowing about the right bifurcation. Right ICA widely patent distally to the skull base without stenosis or vascular occlusion. LEFT CAROTID SYSTEM: Left common carotid artery widely patent from its origin to the bifurcation. 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 Left ICA remains occluded to the circle-of-Willis. VERTEBRAL ARTERIES: Both vertebral arteries arise from the subclavian arteries. Right vertebral artery dominant and widely patent to the skull base. Multifocal moderate to severe stenoses seen involving the proximal left V1 segment, likely atherosclerotic (series 1046, image 13). Left vertebral artery otherwise widely patent distally without stenosis or vascular occlusion. Incidental note made of a 3 cm right thyroid nodule, indeterminate. IMPRESSION: MRI HEAD IMPRESSION: 1. Patchy multifocal acute to early subacute ischemic left MCA territory infarcts involving the left insular cortex, left basal ganglia, and overlying left frontal and temporal lobes as above. No significant mass effect. 2. Associated petechial hemorrhage at the left lentiform nucleus without frank hemorrhagic transformation. 3. Abnormal flow void within the left ICA to the level of the terminus, consistent with occlusion. See below. 4. Underlying mild chronic microvascular ischemic disease. MRA HEAD IMPRESSION: 1. Motion degraded exam. 2. 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 Left MCA and its branches are perfused via collateral flow across the circle-of-Willis and/or from the posterior circulation. 3. Otherwise grossly negative MRA of the head. No other large vessel occlusion. No hemodynamically significant or correctable stenosis identified. MRA NECK IMPRESSION: 1. 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 2. Wide patency of the right carotid artery system within the neck. 3. Multifocal moderate to severe proximal left  V1 stenoses as above. Left vertebral artery otherwise widely patent as is the dominant right vertebral artery. 4. Approximate 3 cm right thyroid nodule, indeterminate. Follow-up examination with dedicated thyroid ultrasound recommended for further evaluation. This could be performed on a nonemergent outpatient basis. Electronically Signed   By: Jeannine Boga M.D.   On: 05/17/2019 04:27   MR BRAIN WO CONTRAST  Result Date: 05/17/2019 CLINICAL DATA:  Initial evaluation for acute stroke, right-sided weakness, dysarthria. EXAM: MRI HEAD WITHOUT CONTRAST MRA HEAD WITHOUT CONTRAST MRA NECK WITHOUT AND WITH CONTRAST TECHNIQUE: Multiplanar, multiecho pulse sequences of the brain and surrounding structures were obtained without intravenous contrast. Angiographic images of the Circle of Willis were obtained using MRA technique without intravenous contrast. Angiographic images of the neck were obtained using MRA technique without and with intravenous contrast. Carotid stenosis measurements (when applicable) are obtained utilizing NASCET criteria, using the distal internal carotid diameter as the denominator. CONTRAST:  7.69mL GADAVIST GADOBUTROL 1 MMOL/ML IV SOLN COMPARISON:  Prior head CT from earlier the same day. FINDINGS: MRI HEAD FINDINGS Brain: Examination mildly degraded by motion artifact. Diffuse prominence of the CSF containing spaces compatible with generalized age-related cerebral atrophy. Patchy T2/FLAIR hyperintensity within the periventricular deep white matter both cerebral hemispheres most consistent with chronic small vessel ischemic disease, mild for age. Multifocal areas of restricted  diffusion involving the left insular cortex, left basal ganglia, with a few additional patchy cortical infarcts within the overlying left frontal and temporal lobes, consistent with left MCA territory infarcts. These are acute to early subacute in appearance with associated T2/FLAIR signal abnormality. Associated  petechial hemorrhage at the level of the left lentiform nucleus without frank hemorrhagic transformation. Diffusion abnormality/edema extending into the left cerebral peduncle and midbrain indicative of involvement of the underlying corticospinal tract. No significant mass effect. No other evidence for acute or subacute ischemia. Gray-white matter differentiation otherwise maintained. No encephalomalacia to suggest chronic cortical infarction elsewhere within the brain. No other foci of susceptibility artifact to suggest acute or chronic intracranial hemorrhage. No mass lesion, midline shift or mass effect. No hydrocephalus. No extra-axial fluid collection. Pituitary gland within normal limits. Midline structures intact. Vascular: Loss of normal flow void within the left ICA to the level of the terminus, which could be related to slow flow and/or occlusion. Major intracranial vascular flow voids otherwise maintained. Skull and upper cervical spine: Craniocervical junction within normal limits. Bone marrow signal intensity normal. No scalp soft tissue abnormality. Sinuses/Orbits: Globes and orbital soft tissues within normal limits. Mild scattered mucosal thickening noted within the ethmoidal sinuses. Small left maxillary sinus retention cyst noted. No significant mastoid effusion. Inner ear structures grossly normal. Other: None. MRA HEAD FINDINGS ANTERIOR CIRCULATION: Examination moderately degraded by motion artifact. 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 Distal cervical segment of the right ICA widely patent with antegrade flow. Petrous, cavernous, and supraclinoid segments demonstrate atheromatous irregularity but are patent without definite flow-limiting stenosis. Both A1 segments widely patent. Normal anterior communicating artery. Anterior cerebral arteries patent to their distal aspects without stenosis. Right M1 widely patent. Perfusion of the left M1 via collateral flow across the  circle-of-Willis and/or from the posterior circulation. Left M1 widely patent as well. Grossly negative MCA bifurcations. Distal MCA branches well perfused. Right MCA branches somewhat attenuated as compared to the contralateral left. POSTERIOR CIRCULATION: Vertebral arteries widely patent to the vertebrobasilar junction without stenosis. Right vertebral artery dominant. Posterior inferior cerebral arteries patent bilaterally. Basilar widely patent to its distal aspect without stenosis. Superior cerebral arteries patent bilaterally. PCA supplied via the basilar as well as robust bilateral posterior communicating arteries. Both PCAs well perfused to their distal aspects without stenosis. No intracranial aneurysm. MRA NECK FINDINGS AORTIC ARCH: Visualized aortic arch of normal caliber with normal 3 vessel morphology. No hemodynamically significant stenosis seen about the origin of the great vessels. Visualized subclavian arteries widely patent. RIGHT CAROTID SYSTEM: Right common carotid artery widely patent from its origin to the bifurcation without stenosis. No significant atheromatous narrowing about the right bifurcation. Right ICA widely patent distally to the skull base without stenosis or vascular occlusion. LEFT CAROTID SYSTEM: Left common carotid artery widely patent from its origin to the bifurcation. 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 Left ICA remains occluded to the circle-of-Willis. VERTEBRAL ARTERIES: Both vertebral arteries arise from the subclavian arteries. Right vertebral artery dominant and widely patent to the skull base. Multifocal moderate to severe stenoses seen involving the proximal left V1 segment, likely atherosclerotic (series 1046, image 13). Left vertebral artery otherwise widely patent distally without stenosis or vascular occlusion. Incidental note made of a 3 cm right thyroid nodule, indeterminate. IMPRESSION: MRI HEAD IMPRESSION: 1. Patchy  multifocal acute to early subacute ischemic left MCA territory infarcts involving the left insular cortex, left basal ganglia,  and overlying left frontal and temporal lobes as above. No significant mass effect. 2. Associated petechial hemorrhage at the left lentiform nucleus without frank hemorrhagic transformation. 3. Abnormal flow void within the left ICA to the level of the terminus, consistent with occlusion. See below. 4. Underlying mild chronic microvascular ischemic disease. MRA HEAD IMPRESSION: 1. Motion degraded exam. 2. 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 Left MCA and its branches are perfused via collateral flow across the circle-of-Willis and/or from the posterior circulation. 3. Otherwise grossly negative MRA of the head. No other large vessel occlusion. No hemodynamically significant or correctable stenosis identified. MRA NECK IMPRESSION: 1. 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 2. Wide patency of the right carotid artery system within the neck. 3. 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 Left vertebral artery otherwise widely patent as is the dominant right vertebral artery. 4. Approximate 3 cm right thyroid nodule, indeterminate. Follow-up examination with dedicated thyroid ultrasound recommended for further evaluation. This could be performed on a nonemergent outpatient basis. Electronically Signed   By: Jeannine Boga M.D.   On: 05/17/2019 04:27   DG Chest Portable 1 View  Result Date: 05/17/2019 CLINICAL DATA:  Slurred speech, right-sided weakness EXAM: PORTABLE CHEST 1 VIEW COMPARISON:  None. FINDINGS: Mild hyperinflation of the lungs. Heart and mediastinal contours are within normal limits. No focal opacities or effusions. No acute bony abnormality. IMPRESSION: No active disease. Electronically Signed   By: Rolm Baptise M.D.   On: 05/17/2019 00:08   ECHOCARDIOGRAM COMPLETE  Result Date: 05/17/2019    ECHOCARDIOGRAM REPORT   Patient Name:   Ruben Moore Date of Exam: 05/17/2019 Medical Rec #:  [id number]        Height:       75.0 in Accession #:    [id number]       Weight:       162.0 lb Date of Birth:  01-15-1946         BSA:          2.01 m Patient Age:    34 years         BP:           113/50 mmHg Patient Gender: M                HR:           62 bpm. Exam Location:  Inpatient Procedure: 2D Echo, Color Doppler and Cardiac Doppler Indications:    Stroke  History:        Patient has no prior history of Echocardiogram examinations.                 Risk Factors:Diabetes.  Sonographer:    Raquel Sarna Senior RDCS Referring Phys: [id number] [place]  1. Left ventricular ejection fraction, by visual estimation, is 60 to 65%. The left ventricle has normal function. There is no left ventricular hypertrophy.  2. The left ventricle has no regional wall motion abnormalities.  3. Global right ventricle has normal systolic function.The right ventricular size is normal. No increase in right ventricular wall thickness.  4. Left atrial size was normal.  5. Right atrial size was normal.  6. The mitral valve is normal in structure. No evidence of mitral valve regurgitation. No evidence of mitral stenosis.  7. The tricuspid valve is normal in structure.  8. The tricuspid valve is normal in structure. Tricuspid valve regurgitation  is trivial.  9. The aortic valve is normal in structure. Aortic valve regurgitation is not visualized. No evidence of aortic valve sclerosis or stenosis. 10. The pulmonic valve was normal in structure. Pulmonic valve regurgitation is not visualized. 11. Mildly elevated pulmonary artery systolic pressure. 12. The inferior vena cava is normal in size with greater than 50% respiratory variability, suggesting right atrial pressure of 3 mmHg. FINDINGS  Left Ventricle: Left ventricular ejection fraction, by visual estimation, is 60 to 65%. The left ventricle has normal function. The left ventricle has  no regional wall motion abnormalities. The left ventricular internal cavity size was the left ventricle is normal in size. There is no left ventricular hypertrophy. Left ventricular diastolic parameters were normal. Normal left atrial pressure. Right Ventricle: The right ventricular size is normal. No increase in right ventricular wall thickness. Global RV systolic function is has normal systolic function. The tricuspid regurgitant velocity is 2.76 m/s, and with an assumed right atrial pressure  of 3 mmHg, the estimated right ventricular systolic pressure is mildly elevated at 33.5 mmHg. Left Atrium: Left atrial size was normal in size. Right Atrium: Right atrial size was normal in size Pericardium: There is no evidence of pericardial effusion. Mitral Valve: The mitral valve is normal in structure. No evidence of mitral valve regurgitation. No evidence of mitral valve stenosis by observation. Tricuspid Valve: The tricuspid valve is normal in structure. Tricuspid valve regurgitation is trivial. Aortic Valve: The aortic valve is normal in structure. Aortic valve regurgitation is not visualized. The aortic valve is structurally normal, with no evidence of sclerosis or stenosis. Pulmonic Valve: The pulmonic valve was normal in structure. Pulmonic valve regurgitation is not visualized. Pulmonic regurgitation is not visualized. Aorta: The aortic root, ascending aorta and aortic arch are all structurally normal, with no evidence of dilitation or obstruction. Venous: The inferior vena cava is normal in size with greater than 50% respiratory variability, suggesting right atrial pressure of 3 mmHg. IAS/Shunts: No atrial level shunt detected by color flow Doppler. There is no evidence of a patent foramen ovale. No ventricular septal defect is seen or detected. There is no evidence of an atrial septal defect.  LEFT VENTRICLE PLAX 2D LVIDd:         3.80 cm  Diastology LVIDs:         2.50 cm  LV e' lateral:   8.49 cm/s LV PW:          0.90 cm  LV E/e' lateral: 9.1 LV IVS:        1.00 cm  LV e' medial:    6.31 cm/s LVOT diam:     2.00 cm  LV E/e' medial:  12.2 LV SV:         40 ml LV SV Index:   20.21 LVOT Area:     3.14 cm  RIGHT VENTRICLE RV S prime:     13.40 cm/s TAPSE (M-mode): 2.1 cm LEFT ATRIUM             Index LA diam:        2.40 cm 1.20 cm/m LA Vol (A2C):   57.8 ml 28.81 ml/m LA Vol (A4C):   34.5 ml 17.19 ml/m LA Biplane Vol: 49.2 ml 24.52 ml/m  AORTIC VALVE LVOT Vmax:   97.50 cm/s LVOT Vmean:  62.900 cm/s LVOT VTI:    0.206 m  AORTA Ao Root diam: 2.30 cm MITRAL VALVE  TRICUSPID VALVE MV Area (PHT): 2.95 cm             TR Peak grad:   30.5 mmHg MV PHT:        74.53 msec           TR Vmax:        276.00 cm/s MV Decel Time: 257 msec MV E velocity: 77.10 cm/s 103 cm/s  SHUNTS MV A velocity: 79.50 cm/s 70.3 cm/s Systemic VTI:  0.21 m MV E/A ratio:  0.97       1.5       Systemic Diam: 2.00 cm  Dani Gobble Croitoru MD Electronically signed by Sanda Klein MD Signature Date/Time: 05/17/2019/10:54:47 AM    Final     Labs:  Basic Metabolic Panel: Recent Labs  Lab 05/27/19 0557  CREATININE 1.18    CBC: No results for input(s): WBC, NEUTROABS, HGB, HCT, MCV, PLT in the last 168 hours.  CBG: Recent Labs  Lab 06/01/19 2049 06/02/19 0634 06/02/19 1209 06/02/19 1642 06/02/19 2106  GLUCAP 91 101* 94 108* 140*   Family history.  Brother with diabetes as well as hypertension.  Denies any colon cancer or rectal cancer or esophageal cancer  Brief HPI:   Ruben Moore is a 74 y.o. right-handed male with history of bladder cancer, chronic left carotid occlusion [facility] the [facility], diet-controlled diabetes mellitus and tobacco abuse.  History taken from chart review as patient has expressive aphasia.  On no prescription medications.  Lives alone independent prior to admission.  He is retired.  He does have a daughter in the area that works.  Presented 05/17/2019 with right side weakness and  aphasia.  Noted systolic blood pressure in the 150s.  SARS coronavirus negative, potassium 6.0, troponin negative, urine drug screen positive marijuana, alcohol level negative.  Cranial CT scan showed left insular cortex left basal ganglia corona radiata infarction likely acute to subacute.  No midline shift.  Patient did not receive TPA.  MRI of the brain showed patchy multifocal acute to early subacute ischemia left MCA territory.  MRA of the head occlusion of the left ICA to the level of the terminus.  CTA of head and neck left ICA occlusion at its origin.  No significant intracranial stenosis.  Incidental finding of a 3.3 right thyroid nodule recommending thyroid ultrasound as outpatient.  Echocardiogram with ejection fraction of 65% without emboli.  Neurology services consulted maintain on aspirin 325 mg daily and Plavix 75 mg daily for 3 months then aspirin alone.  Subcutaneous Lovenox for DVT prophylaxis.  Tolerating a regular diet.  Therapy evaluations completed and patient was admitted for a comprehensive rehab program.   Hospital Course: Medhansh Spangler was admitted to rehab 05/20/2019 for inpatient therapies to consist of PT, ST and OT at least three hours five days a week. Past admission physiatrist, therapy team and rehab RN have worked together to provide customized collaborative inpatient rehab.  Pertaining to patient's left MCA patchy infarction remained stable also in setting of 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  He remained on Lovenox for DVT prophylaxis throughout his hospital course.  Aspirin 325 mg daily and Plavix 75 mg daily x3 months and aspirin alone.  Patient would follow-up outpatient neurology services.  He continued on Lipitor for hyperlipidemia.  Pain managed with the use of a Lidoderm patch due to some intermittent left-sided rib discomfort.  No chest pain or shortness of breath.  He did have a history of tobacco as well as marijuana  use and was provided counseling [place] cessation  of tobacco alcohol or illicit drug use it was questionable if he would be compliant with these request.  Hemoglobin A1c 5.4 diabetic diet as advised.   Blood pressures were monitored on TID basis and controlled  Diabetes has been monitored with ac/hs CBG checks and SSI was use prn for tighter BS control.   He/ is continent of bowel and bladder.  He/ has made gains during rehab stay and is attending therapies  He/ will continue to receive follow up therapies   after discharge  Rehab course: During patient's stay in rehab weekly team conferences were held to monitor patient's progress, set goals and discuss barriers to discharge. At admission, patient required moderate assist ambulate 150 feet rolling walker, minimal assist sit to stand, moderate assist upper body bathing mod assist lower body bathing mod assist upper body dressing max is lower body dressing +2 physical assist toilet transfers  Physical exam.  Blood pressure 111/65 pulse 61 temperature 98.5 respiration 16 oxygen saturation 97% room air Constitutional.  Alert and oriented well-developed speech was dysarthric but intelligible HEENT Head.  Normocephalic and atraumatic Eyes.  Pupils round and reactive to light no discharge without nystagmus Neck.  Supple nontender no JVD without thyromegaly Cardiac regular rate rhythm without any extra sounds or murmur heard Respiratory effort normal no respiratory distress without wheeze Abdomen.  Soft nontender positive bowel sounds without rebound Musculoskeletal no edema or tenderness in extremities Neurological.  He was alert and oriented no acute distress follows commands mild dysarthria fair awareness of deficits right facial weakness. Motor.  Right upper extremity 3+ out of 5 proximal distal right lower extremity 4-4+5 proximal distal Left upper extremity left lower extremity 5 out of 5 proximal to distal.  He/  has had improvement in activity tolerance, balance, postural control as  well as ability to compensate for deficits. He/has had improvement in functional use RUE/LUE  and RLE/LLE as well as improvement in awareness.  Ambulatory transfers to toilet without assistive device and supervision assist.  He is able to complete urination standing at toilet with distant supervision.  Gait training and simulated community environment rolling walker 200 feet x 2 without assistive device.  Supervision assist overall with several small loss of balance noted.  Patient also performed gait training over cemented sidewalk with rolling walker.  Supervision tub transfers.  Gather his belongings for activities day living homemaking dressing grooming and hygiene.  Speech therapy facilitated sessions with functional conversation regarding patient's goals and progress throughout sessions he did have some mixed aphasia and dysarthria he was able to communicate his needs.  Emphasis on allowing extra time for responses as well as somatic and phonemic cues.  Family teaching completed plan discharge to home       Disposition: Discharge to home    Diet: Carb modified diet  Special Instructions: No driving smoking alcohol or illicit drug use  Follow-up outpatient thyroid ultrasound to evaluate right thyroid nodule  Aspirin 325 mg daily and Plavix 75 mg daily x3 months then aspirin alone  Medications at discharge 1.  Tylenol as needed 2.  Aspirin 325 mg p.o. daily 3.  Lipitor 40 mg p.o. daily 4.  Plavix 75 mg p.o. daily 5.  Lidoderm patch change as directed 6.  Multivitamin daily 7.  Senokot S2 tablets p.o. twice daily hold for loose stools  Discharge Instructions    Ambulatory referral to Neurology   Complete by: As directed    An appointment  is requested in approximately 4 weeks left MCA infarction   Ambulatory referral to Physical Medicine Rehab   Complete by: As directed    Moderate complexity follow-up 1 to 2 weeks left MCA infarction      Follow-up Information    Kirsteins,  Luanna Salk, MD Follow up.   Specialty: Physical Medicine and Rehabilitation Why: Office to call for appointment Contact information: [place] [address] [phone]           Signed: Cathlyn Parsons 06/03/2019, 5:27 AM

## 2019-06-01 NOTE — Progress Notes (Signed)
Recreational Therapy Session Note  Patient Details  Name: Ruben Moore MRN: [id number] Date of Birth: [birth year] Today's Date: 06/01/2019  Pain: no c/o  Skilled Therapeutic Interventions/Progress Updates: Session focused on activity tolerance, community reintegration/mobility, discharge planning and fall recovery during co-treat with PT.  Pt ambulated throughout hospital gift shop navigating around people and objects with RW with supervision.   After seated rest break,ambulated throughout the gift shop again but without AD.  Pt with 3-4 LOB, but able to recover with assistance.  After seated rest break, pt ambulated over thresholds, floor/door mats/rugs at entrances and outside on slightly uneven concrete surfaces with RW with supervision.  Therapy/Group:  cotreatment   [name] 06/01/2019, 3:38 PM

## 2019-06-01 NOTE — Patient Care Conference (Signed)
Inpatient RehabilitationTeam Conference and Plan of Care Update Date: 06/01/2019   Time: 10:15 AM   Patient Name: Ruben Moore      Medical Record Number: [id number]  Date of Birth: 21-Apr-1946 Sex: Male         Room/Bed: 4M03C/4M03C-01 Payor Info: Payor: MEDICARE / Plan: MEDICARE PART A / Product Type: *No Product type* /    Admit Date/Time:  05/20/2019  4:01 PM  Primary Diagnosis:  Left middle cerebral artery stroke [facility])  Patient Active Problem List   Diagnosis Date Noted  . Left middle cerebral artery stroke ([place]) 05/20/2019  . Thyroid nodule   . Right hemiparesis ([place])   . Dyslipidemia   . Acute ischemic stroke ([place]) 05/17/2019  . Hyperkalemia 05/17/2019  . Diabetes mellitus type II, non insulin dependent ([place]) 05/17/2019  . Bladder cancer ([place])   . Tobacco use     Expected Discharge Date: Expected Discharge Date: 06/03/19  Team Members Present: Physician leading conference: Dr. Alysia Penna Social Worker Present: Lennart Pall, LCSW Nurse Present: Dorien Chihuahua, RN; Rayetta Pigg, RN Case Manager: Karene Fry, RN PT Present: Barrie Folk, PT OT Present: Darleen Crocker, OT SLP Present: Jettie Booze, CF-SLP PPS Coordinator present : Ileana Ladd, Burna Mortimer, SLP     Current Status/Progress Goal Weekly Team Focus  Bowel/Bladder   Continent of bowel and bladder, occasional incontinence with bladder LBM 06/01/19  Decrease incontinent episodes  Toileting as needed, assess frequently   Swallow/Nutrition/ Hydration             ADL's   CGA bathing at shower level, supervision UB dressing, Min A LB dressing, CGA toileting, CGA bathroom transfers using RW at ambulatory level  Supervision overall      Mobility   supervision bed mobility and transfers, supervision gait with RW, CGA to minA no AD  supervision overall with CGA stairs  balance, RLE strengthening, ambulation with LRAD   Communication   Supervision A intelligibility 90-95% conversation level, Min A  word finding to communicate daily info, cues required increase in structured generative naming tasks  Supervision A  carryover word finding strategies, education   Safety/Cognition/ Behavioral Observations            Pain   No complaints of pain  Pain 3 or less  Assess every shift/PRN   Skin   Abrasion to right arm  Prevent further skin breakdown  Assess skin every shift/PRN    Rehab Goals Patient on target to meet rehab goals: Yes *See Care Plan and progress notes for long and short-term goals.     Barriers to Discharge  Current Status/Progress Possible Resolutions Date Resolved   Nursing                  PT                    OT                  SLP                SW Decreased caregiver support;Home environment access/layout Daughter notes she and her sister will provide 24/7 care as needed            Discharge Planning/Teaching Needs:  Home with daughter coming in to assist/supervise as needed  Family education 06/01/19 with daughter   Team Discussion: Rib tenderness better, BP good, bowels better, 8 steps at home.  RN BS stable, L wrist/L rib pain.  OT  fam ed today, S to min guard, goals S level, hand better, has exercise program, seems depressed, tearful.  PT did stairs yesterday 8 steps CGA with 1 rail with max cuing, amb CGA/min RW, S transfers and bed mobility.  SLP S goals level to express wants/needs, frustrated with finding words, fam ed with Dtr done.   Revisions to Treatment Plan: N/A     Medical Summary Current Status: BP controlled, no recent med changes, MSK pain improved Weekly Focus/Goal: DC planning  Barriers to Discharge: Decreased family/caregiver support   Possible Resolutions to Barriers: family ed   Continued Need for Acute Rehabilitation Level of Care: The patient requires daily medical management by a physician with specialized training in physical medicine and rehabilitation for the following reasons: Direction of a multidisciplinary physical  rehabilitation program to maximize functional independence : Yes Medical management of patient stability for increased activity during participation in an intensive rehabilitation regime.: Yes Analysis of laboratory values and/or radiology reports with any subsequent need for medication adjustment and/or medical intervention. : Yes   I attest that I was present, lead the team conference, and concur with the assessment and plan of the team.   Jodell Cipro M 06/01/2019, 8:19 PM   Team conference was held via web/ teleconference due to [place] - 19

## 2019-06-02 ENCOUNTER — Inpatient Hospital Stay (HOSPITAL_COMMUNITY): Payer: Non-veteran care | Admitting: Speech Pathology

## 2019-06-02 ENCOUNTER — Inpatient Hospital Stay (HOSPITAL_COMMUNITY): Payer: Non-veteran care | Admitting: Physical Therapy

## 2019-06-02 ENCOUNTER — Inpatient Hospital Stay (HOSPITAL_COMMUNITY): Payer: Non-veteran care | Admitting: Occupational Therapy

## 2019-06-02 DIAGNOSIS — I63512 Cerebral infarction due to unspecified occlusion or stenosis of left middle cerebral artery: Secondary | ICD-10-CM

## 2019-06-02 LAB — GLUCOSE, CAPILLARY
Glucose-Capillary: 101 mg/dL — ABNORMAL HIGH (ref 70–99)
Glucose-Capillary: 94 mg/dL (ref 70–99)
Glucose-Capillary: 108 mg/dL — ABNORMAL HIGH (ref 70–99)
Glucose-Capillary: 140 mg/dL — ABNORMAL HIGH (ref 70–99)

## 2019-06-02 MED ORDER — ENSURE ENLIVE PO LIQD
237.0000 mL | Freq: Three times a day (TID) | ORAL | Status: DC
  Administered 2019-06-02 – 2019-06-03 (×3): 237 mL via ORAL

## 2019-06-02 MED ORDER — ACETAMINOPHEN 325 MG PO TABS: 650.0000 mg | ORAL_TABLET | ORAL | Status: AC | PRN

## 2019-06-02 MED ORDER — ADULT MULTIVITAMIN W/MINERALS CH: 1.0000 | ORAL_TABLET | Freq: Every day | ORAL | Status: AC

## 2019-06-02 MED ORDER — SENNOSIDES-DOCUSATE SODIUM 8.6-50 MG PO TABS: 2.0000 | ORAL_TABLET | Freq: Two times a day (BID) | ORAL | Status: AC

## 2019-06-02 MED ORDER — CLOPIDOGREL BISULFATE 75 MG PO TABS: 75.0000 mg | ORAL_TABLET | Freq: Every day | ORAL | 1 refills | Status: AC

## 2019-06-02 MED ORDER — LIDOCAINE 5 % EX PTCH: 1.0000 | MEDICATED_PATCH | CUTANEOUS | 0 refills | Status: AC

## 2019-06-02 MED ORDER — ATORVASTATIN CALCIUM 40 MG PO TABS: 40.0000 mg | ORAL_TABLET | Freq: Every day | ORAL | 1 refills | Status: AC

## 2019-06-02 NOTE — Progress Notes (Signed)
[place] PHYSICAL MEDICINE & REHABILITATION PROGRESS NOTE   Subjective/Complaints:  Pt was told he may go home 2/12 vs 2/15 , not documented    ROS- denies abd pain, , SOB  Objective:   No results found. No results for input(s): WBC, HGB, HCT, PLT in the last 72 hours. No results for input(s): NA, K, CL, CO2, GLUCOSE, BUN, CREATININE, CALCIUM in the last 72 hours.  Intake/Output Summary (Last 24 hours) at 06/02/2019 0750 Last data filed at 06/02/2019 0100 Gross per 24 hour  Intake 555 ml  Output 225 ml  Net 330 ml     Physical Exam: Vital Signs Blood pressure 117/64, pulse 71, temperature 98.4 F (36.9 C), resp. rate 16, height 6\' 3"  (1.905 m), weight 74.1 kg, SpO2 99 %. Vitals reviewed. Constitutional: aphasic; did say "no when asked if any issues". ; but was appropriate, NAD  HENT:  Head: Normocephalic and atraumatic.  Eyes: EOM are normal. Right eye exhibits no discharge. Left eye exhibits no discharge.  Neck: No tracheal deviation present. No thyromegaly present.  Respiratory: Effort normal. No stridor. No respiratory distress. Lungs clear Heart- RRR no ubs murmur or ES  GI: Soft. He exhibits no distension. No epigastric tenderness  Musculoskeletal:     Comments: there is left parasternal and  lateral rib tenderness in mid axillary line just inferior to axilla   Neurological: He is alert and oriented to person, place, and time.  Patient is alert in no acute distress.    Dysarthria, word finding difficulties.  Fair awareness of his deficits. Right facial weakness Motor: Right upper extremity: 3+/5 proximal distal Right lower extremity: 4-4+/5 proximal distal- pt requires some cues to activate Left upper extremity/left lower extremity: 5/5 proximal distal Expressive aphasia. Difficulty with naming tasks.  Skin: Skin is warm and dry. Bleeding from small area in right lower abdomen; dressing applied. Minimal drainage at this time.  Psychiatric: He has a normal mood  and affect. His behavior is normal.     Assessment/Plan: 1. 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 which require 3+ hours per day of interdisciplinary therapy in a comprehensive inpatient rehab setting.  Physiatrist is providing close team supervision and 24 hour management of active medical problems listed below.  Physiatrist and rehab team continue to assess barriers to discharge/monitor patient progress toward functional and medical goals  Care Tool:  Bathing  Bathing activity did not occur: Refused Body parts bathed by patient: Right arm, Left arm, Chest, Abdomen, Front perineal area, Buttocks, Right upper leg, Left upper leg, Right lower leg, Left lower leg, Face         Bathing assist Assist Level: Contact Guard/Touching assist(vcs for holding grab bar with Rt hand during dynamic sitting for improved stability)     Upper Body Dressing/Undressing Upper body dressing   What is the patient wearing?: Pull over shirt    Upper body assist Assist Level: Set up assist    Lower Body Dressing/Undressing Lower body dressing      What is the patient wearing?: Pants     Lower body assist Assist for lower body dressing: Supervision/Verbal cueing     Toileting Toileting Toileting Activity did not occur (Clothing management and hygiene only): Refused  Toileting assist Assist for toileting: Minimal Assistance - Patient > 75%     Transfers Chair/bed transfer  Transfers assist     Chair/bed transfer assist level: Supervision/Verbal cueing     Locomotion Ambulation   Ambulation assist  Assist level: Supervision/Verbal cueing Assistive device: Walker-rolling Max distance: 75'   Walk 10 feet activity   Assist     Assist level: Supervision/Verbal cueing Assistive device: Walker-rolling   Walk 50 feet activity   Assist Walk 50 feet with 2 turns activity did not occur: Safety/medical concerns  Assist level: Supervision/Verbal  cueing Assistive device: Walker-rolling    Walk 150 feet activity   Assist Walk 150 feet activity did not occur: Safety/medical concerns  Assist level: Contact Guard/Touching assist Assistive device: Walker-rolling    Walk 10 feet on uneven surface  activity   Assist Walk 10 feet on uneven surfaces activity did not occur: Safety/medical concerns         Wheelchair     Assist   Type of Wheelchair: Manual    Wheelchair assist level: Minimal Assistance - Patient > 75% Max wheelchair distance: 150    Wheelchair 50 feet with 2 turns activity    Assist        Assist Level: Minimal Assistance - Patient > 75%   Wheelchair 150 feet activity     Assist      Assist Level: Minimal Assistance - Patient > 75%   Blood pressure 117/64, pulse 71, temperature 98.4 F (36.9 C), resp. rate 16, height 6\' 3"  (1.905 m), weight 74.1 kg, SpO2 99 %.  Medical Problem List and Plan: 1.  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       Continue CIR PT, OT, SLP  ELOS 2/12 2.  Antithrombotics: -DVT/anticoagulation: Lovenox             -antiplatelet therapy: Aspirin 325 mg daily and Plavix 75 mg daily x3 months and aspirin alone 3. Pain Management: Tylenol as needed  Intermittent Left sided chest/rib pain, pain increased with taking a deep breath, pt is in no distress appears comfortable, no exacerbation while in therapy , unremarkable  CXR and EKG , monitor- exam is consistent with rib contusion- improved with  Lidocaine patch  Normal ECHO 1/26 EKG    Borderline ST elevation in lateral leads-  Unchanged vs prior trace 4. Mood: Provide emotional support              -antipsychotic agents: N/A 5. Neuropsych: This patient is ?fully capable of making decisions on his own behalf. 6. Skin/Wound Care: Routine skin checks  .  7. Fluids/Electrolytes/Nutrition: Routine  in and outs.  8.  History of tobacco abuse as well as marijuana.  Provide counseling 9.  Hyperlipidemia.  Cont Lipitor.  10.  Diabetes mellitus.  Hemoglobin A1c 5.4.  SSI.  Patient on no medications prior to admission             Monitor with increased mobility.  11.  3.3 cm right thyroid nodule.  Follow-up as outpatient with ultrasound 12. Bradycardia: asymptomatic NSR on EKG 2/2  2/11: well controlled.  Vitals:   06/01/19 1951 06/02/19 0518  BP: 120/71 117/64  Pulse: 65 71  Resp: 16 16  Temp: 97.9 F (36.6 C) 98.4 F (36.9 C)  SpO2: 100% 99%    13.  Constipation increased Senna S  to 2 po BID - 2 stools yesterday Daily BMs , continent   LOS: 13 days A FACE TO FACE EVALUATION WAS PERFORMED  Charlett Blake 06/02/2019, 7:50 AM

## 2019-06-02 NOTE — Progress Notes (Signed)
Nutrition Follow-up  DOCUMENTATION CODES:   Not applicable  INTERVENTION:   Continue Ensure Enlive po TID, each supplement provides 350 kcal and 20 grams of protein (change flavor to strawberry only per pt's request)  Continue Magic cup TID with meals, each supplement provides 290 kcal and 9 grams of protein  Continue MVI daily    NUTRITION DIAGNOSIS:   Inadequate oral intake related to acute illness as evidenced by per patient/family report.  Ongoing.  GOAL:   Patient will meet greater than or equal to 90% of their needs  Progressing.  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  Noted anticipated discharge 2/12.   Pt's wt stable since admission.   Discussed pt with RN.   Pt reports his appetite prior to today has been okay, but he reports poor appetite today; states he "just didn't want it" when asked about breakfast intake this morning. Pt reports he has been consuming his supplements, but requests strawberry Ensure Enlive only.   PO intake: 10-100% x last 8 recorded meals (84% average intake)  Medications reviewed and include: Ensure Enlive TID, SSI, MVI, Senokot-S, Thiamine  Labs reviewed. CBGs 74-101  UOP: 277ml x 24 hours I/O: +104ml since admit  NUTRITION - FOCUSED PHYSICAL EXAM:    Most Recent Value  Orbital Region  No depletion  Upper Arm Region  Mild depletion  Thoracic and Lumbar Region  No depletion  Buccal Region  No depletion  Temple Region  Mild depletion  Clavicle Bone Region  Mild depletion  Clavicle and Acromion Bone Region  No depletion  Scapular Bone Region  No depletion  Dorsal Hand  No  depletion  Patellar Region  No depletion  Anterior Thigh Region  No depletion  Posterior Calf Region  Mild depletion  Edema (RD Assessment)  None  Hair  Reviewed  Eyes  Reviewed  Mouth  Reviewed  Skin  Reviewed  Nails  Reviewed       Diet Order:   Diet Order            Diet heart healthy/carb modified Room service appropriate? Yes with Assist; Fluid consistency: Thin  Diet effective now              EDUCATION NEEDS:   Education needs have been addressed  Skin:  Skin Assessment: Reviewed RN Assessment  Last BM:  2/10  Height:   Ht Readings from Last 1 Encounters:  05/20/19 6\' 3"  (1.905 m)    Weight:   Wt Readings from Last 1 Encounters:  05/30/19 74.1 kg    Ideal Body Weight:  89 kg  BMI:  Body mass index is 20.42 kg/m.  Estimated Nutritional Needs:   Kcal:  2100-2400kcal/day  Protein:  110-125g/day  Fluid:  >1.9L/day    Larkin Ina, MS, RD, LDN RD pager number and weekend/on-call pager number located in [place].

## 2019-06-02 NOTE — Plan of Care (Signed)
  Problem: RH BOWEL ELIMINATION Goal: RH STG MANAGE BOWEL WITH ASSISTANCE Description: STG Manage Bowel with mod I Assistance. Outcome: Progressing Goal: RH STG MANAGE BOWEL W/MEDICATION W/ASSISTANCE Description: STG Manage Bowel with Medication with Assistance. Min Outcome: Progressing   Problem: RH BLADDER ELIMINATION Goal: RH STG MANAGE BLADDER WITH ASSISTANCE Description: STG Manage Bladder With min Assistance Outcome: Progressing   Problem: RH SKIN INTEGRITY Goal: RH STG SKIN FREE OF INFECTION/BREAKDOWN Description: Skin remains free of breakdown and infection - mod I Outcome: Progressing Goal: RH STG MAINTAIN SKIN INTEGRITY WITH ASSISTANCE Description: STG Maintain Skin Integrity With Assistance. Mod I Outcome: Progressing   Problem: RH SAFETY Goal: RH STG ADHERE TO SAFETY PRECAUTIONS W/ASSISTANCE/DEVICE Description: STG Adhere to Safety Precautions With Assistance/Device. Mod I Outcome: Progressing Goal: RH STG DECREASED RISK OF FALL WITH ASSISTANCE Description: STG Decreased Risk of Fall With Assistance. Mod I Outcome: Progressing   Problem: RH PAIN MANAGEMENT Goal: RH STG PAIN MANAGED AT OR BELOW PT'S PAIN GOAL Description: No pain or less than 3 Outcome: Progressing

## 2019-06-02 NOTE — Progress Notes (Signed)
Speech Language Pathology Discharge Summary  Patient Details  Name: Ruben Moore MRN: [id number] Date of Birth: March 21, 1946  Today's Date: 06/02/2019 SLP Individual Time: 1357-1450 SLP Individual Time Calculation (min): 53 min   Skilled Therapeutic Interventions:  Pt was seen for skilled ST targeting communication goals. SLP facilitated session with a task targeting sentence level object functions and/or descriptions. Pt verbalized and wrote object functions/descriptions with overall Supervision A semantic cues. Pt required 1-2 word semantic cues for less than 25% of generative naming tasks (breakfast foods and jobs/occupations). During conversational tasks initiated by pt, he continued to express feeling as though his care is a burden on his daughter; SLP provided active listening and encouragement, opportunities for reflection on his progress and very willing/forthcoming support from his daughters. Pt also expressed feeling as though he didn't "know what was going on" in the outside world. SLP provided recommendations such as reading the newspaper, watching the news, etc. to stay engage in current events that are important to him. Pt expressed his basic wants and needs with no more than Supervision A verbal cues throughout session, however more complex and abstract conversation required Min A verbal cues for clarification of messages and use of word finding strategies. Pt would continue to benefit from skilled [place] targeting complex/abstract language and continue to target generative naming. Pt left sitting in recliner with alarm set and needs within reach. Continue per current plan of care.      Patient has met 3 of 3 long term goals.  Patient to discharge at overall Supervision level.  Reasons goals not met: n/a  Clinical Impression/Discharge Summary:   Pt made functional gains and met 3 out of 3 long term goals this admission. Pt currently requires Supervision assist for word finding to when  communicating basic daily information and expressing his wants and needs. He also uses slow rate and overarticulate speech intelligibility strategies with no more than Supervision A verbal cues and is consistently 95% intelligible at the conversation level. Pt will require 24/7 supervision at discharge. Pt has demonstrated improved ability to communicate basic wants and needs as well as abstract thoughts and required less cueing during generative naming tasks in comparison to admission. However, given word finding deficits present when communicating complex information, and continue to present during generative naming, recommend pt continue to receive skilled [place] services upon discharge. Pt and family education is complete at this time.   Care Partner:  Caregiver Able to Provide Assistance: Yes  Type of Caregiver Assistance: Cognitive  Recommendation:  Home Health SLP;24 hour supervision/assistance  Rationale for SLP Follow Up: Maximize functional communication    Equipment: none   Reasons for discharge: Discharged from [place] 06/02/2019, 10:56 AM

## 2019-06-02 NOTE — Progress Notes (Signed)
Physical Therapy Discharge Summary  Patient Details  Name: Ruben Moore MRN: [id number] Date of Birth: 1945-06-25  Today's Date: 06/02/2019 PT Individual Time: 0905-1005 PT Individual Time Calculation (min): 60 min    Patient has met 9 of 9 long term goals due to improved activity tolerance, improved balance, improved postural control, increased strength, increased range of motion, ability to compensate for deficits, functional use of  right upper extremity and right lower extremity, improved attention, improved awareness and improved coordination.  Patient to discharge at an ambulatory level Supervision.   Patient's care partner is independent to provide the necessary physical assistance at discharge.  Reasons goals not met: All PT goals met    Recommendation:  Patient will benefit from ongoing skilled PT services in home health setting to continue to advance safe functional mobility, address ongoing impairments in balance, strength, coordination,  Gait, safety, Neuromotor control , and minimize fall risk.  Equipment: RW.   Reasons for discharge: treatment goals met and discharge from hospital  Patient/family agrees with progress made and goals achieved: Yes   PT treatment:  PT instructed pt in Grad day assessment to measure progress toward goals. See below for details. Pt performed upper and lower body dressing with supervision assist from PT and increased time for button management on pants. Gait training with and without AD performed 2 x 136f with supervision assist, min cues for knee control and safety in turns without AD. PT provided pt shoe button for improved independence with dressing.  Stair management with supervision assist and BUE support as listed below and min assist for curb management without AD. Car transfer training with no AD and supervision assist with instruction for sit>pivot technique to limit GR on the R knee. Patient demonstrates increased fall risk as noted by  score of   40/56 on Berg Balance Scale.  (<36= high risk for falls, close to 100%; 37-45 significant >80%; 46-51 moderate >50%; 52-55 lower >25%). Pt performed gait training up.down ramp with RW and distant supervision assist from PT for safety. Patient returned to room and left sitting in recliner with call bell in reach and all needs met.       PT Discharge Precautions/Restrictions Precautions Precautions: Fall Precaution Comments: Rt hemiplegia    Pain Pain Assessment Pain Scale: 0-10 Pain Score: 0-No pain denies Vision/Perception     Mild R attention .  Cognition Overall Cognitive Status: Within Functional Limits for tasks assessed Arousal/Alertness: Awake/alert Orientation Level: Oriented X4 Safety/Judgment: Appears intact Sensation Sensation Light Touch: Impaired Detail Light Touch Impaired Details: Impaired RUE;Impaired RLE Additional Comments: mild parathesia in RLE Coordination Gross Motor Movements are Fluid and Coordinated: No Fine Motor Movements are Fluid and Coordinated: No Coordination and Movement Description: mild dysmetria on the RUE Heel Shin Test: mild decreased speed on the compared to teh L Motor  Motor Motor: Hemiplegia;Ataxia Motor - Discharge Observations: mild hemilpegia on the RUE>LE with improved coordination  Mobility Bed Mobility Bed Mobility: Rolling Right;Rolling Left;Sit to Supine;Supine to Sit Rolling Right: Independent with assistive device Rolling Left: Independent with assistive device Supine to Sit: Independent with assistive device Sit to Supine: Independent with assistive device Transfers Transfers: Sit to Stand;Stand Pivot Transfers Sit to Stand: Supervision/Verbal cueing Stand Pivot Transfers: Supervision/Verbal cueing Transfer (Assistive device): Rolling walker Locomotion  Gait Ambulation: Yes Gait Assistance: Supervision/Verbal cueing Gait Distance (Feet): 150 Feet Assistive device: None Gait Gait: Yes Gait Pattern:  Impaired Gait Pattern: Narrow base of support Gait velocity: greatly improved knee control and coordination  in RLE since eval Stairs / Additional Locomotion Stairs: Yes Stairs Assistance: Supervision/Verbal cueing Stair Management Technique: Two rails Number of Stairs: 12 Height of Stairs: 6 Ramp: Supervision/Verbal cueing Wheelchair Mobility Wheelchair Mobility: Yes Wheelchair Assistance: Chartered loss adjuster: Both upper extremities Wheelchair Parts Management: Supervision/cueing Distance: 150  Trunk/Postural Assessment  Cervical Assessment Cervical Assessment: Within Functional Limits Thoracic Assessment Thoracic Assessment: Within Functional Limits Lumbar Assessment Lumbar Assessment: Within Functional Limits Postural Control Postural Control: Within Functional Limits  Balance Standardized Balance Assessment Standardized Balance Assessment: Berg Balance Test Berg Balance Test Sit to Stand: Able to stand  independently using hands Standing Unsupported: Able to stand safely 2 minutes Sitting with Back Unsupported but Feet Supported on Floor or Stool: Able to sit safely and securely 2 minutes Stand to Sit: Controls descent by using hands Transfers: Able to transfer safely, definite need of hands Standing Unsupported with Eyes Closed: Able to stand 10 seconds with supervision Standing Ubsupported with Feet Together: Able to place feet together independently and stand 1 minute safely From Standing, Reach Forward with Outstretched Arm: Can reach forward >12 cm safely (5") From Standing Position, Pick up Object from Floor: Able to pick up shoe, needs supervision From Standing Position, Turn to Look Behind Over each Shoulder: Looks behind one side only/other side shows less weight shift Turn 360 Degrees: Able to turn 360 degrees safely but slowly Standing Unsupported, Alternately Place Feet on Step/Stool: Able to complete >2 steps/needs minimal  assist Standing Unsupported, One Foot in Front: Able to plae foot ahead of the other independently and hold 30 seconds Standing on One Leg: Tries to lift leg/unable to hold 3 seconds but remains standing independently Total Score: 40 Dynamic Sitting Balance Dynamic Sitting - Balance Support: No upper extremity supported;During functional activity Dynamic Sitting - Level of Assistance: 6: Modified independent (Device/Increase time) Dynamic Standing Balance Dynamic Standing - Balance Support: No upper extremity supported;During functional activity Dynamic Standing - Level of Assistance: 5: Stand by assistance Dynamic Standing - Balance Activities: Lateral lean/weight shifting;Forward lean/weight shifting;Reaching for objects Extremity Assessment  RUE Assessment Active Range of Motion (AROM) Comments: WFL General Strength Comments: 3-/5 throughout. Needs cuing to attend and coordinate R UE LUE Assessment LUE Assessment: Within Functional Limits RLE Assessment RLE Assessment: Within Functional Limits General Strength Comments: grossly 4+/5 to 5/5 proximal to distal LLE Assessment LLE Assessment: Within Functional Limits    Lorie Phenix 06/02/2019, 12:19 PM

## 2019-06-02 NOTE — Progress Notes (Signed)
Patient ID: Ruben Moore, male   DOB: 1945-10-03, 74 y.o.   MRN: [id number] Medicare part A will not cover DME. [facility] to seek clearance for [facility] DME recommended[facility] and will work on delivery to the hospital today as pending discharge tomorrow.

## 2019-06-02 NOTE — Progress Notes (Signed)
Occupational Therapy Discharge Summary  Patient Details  Name: Ruben Moore MRN: [id number] Date of Birth: [birth year]  Today's Date: 06/02/2019 OT Individual Time: 0263-7858 OT Individual Time Calculation (min): 70 min    Patient has met 8 of 8 long term goals due to improved activity tolerance, improved balance, postural control, ability to compensate for deficits, functional use of  RIGHT upper and RIGHT lower extremity, improved attention, improved awareness and improved coordination.  Patient to discharge at overall Supervision level.  Patient's care partner, his daughter, is independent to provide the necessary supervision and cuing for safety assistance at discharge.    Reasons goals not met: all goals met  Recommendation:  Patient will benefit from ongoing skilled OT services in home health setting to continue to advance functional skills in the area of BADL and iADL.  Equipment: 3 in 1 commode and TTB  Reasons for discharge: treatment goals met  Patient/family agrees with progress made and goals achieved: Yes   OT Intervention: Upon entering the room, pt seated in recliner chair awaiting OT arrival. Pt agreeable to shower and OT reviewed goals of supervision level and expectation of session. Pt ambulating with RW into bathroom for toileting needs with supervision overall. Pt unable to have BM. Pt doffed clothing items while seated on commode with min cuing for safety awareness. Pt transferred onto TTB for bathing at seated position. Pt needing min cuing to utilize R UE but able to wash self with increased time and no drops from R UE. Pt having difficulty coordinating using shower head at the same time but was able to complete task. Pt exiting the bathroom and seated on EOB for dressing tasks. Pt needing min cuing for hemiplegic dressing technique but able to perform tasks with supervision this session. Shoe buttons removed and pt first practicing lacing and tying shoelaces with  shoe in lap and then able to don and fasten while on foot with increased time. Pt transferred into wheelchair and seated at sink for grooming tasks with supervision as well. When shaving OT requesting pt to use B hands to apply shaving cream but to only use L UE to shave with for safety. Pt transferred into recliner chair at end of session with supervision. Chair alarm belt donned and call bell within reach upon exiting the room.   OT Discharge Precautions/Restrictions  Precautions Precautions: Fall Precaution Comments: Rt hemiplegia Restrictions Weight Bearing Restrictions: No Pain Pain Assessment Pain Scale: 0-10 Pain Score: 0-No pain ADL ADL Eating: Not assessed Grooming: Moderate assistance Where Assessed-Grooming: Chair Upper Body Bathing: Supervision/safety Where Assessed-Upper Body Bathing: Edge of bed Lower Body Bathing: Minimal assistance Where Assessed-Lower Body Bathing: Edge of bed Upper Body Dressing: Supervision/safety Where Assessed-Upper Body Dressing: Edge of bed Lower Body Dressing: Moderate assistance Where Assessed-Lower Body Dressing: Edge of bed Toileting: Not assessed Toilet Transfer: Minimal assistance Toilet Transfer Method: Ambulating(RW) Tub/Shower Transfer: Not assessed Vision Baseline Vision/History: Wears glasses Wears Glasses: At all times Patient Visual Report: Blurring of vision Cognition Overall Cognitive Status: Within Functional Limits for tasks assessed Arousal/Alertness: Awake/alert Orientation Level: Oriented X4 Safety/Judgment: Appears intact Sensation Sensation Light Touch: Impaired Detail Light Touch Impaired Details: Impaired RUE;Impaired RLE Additional Comments: mild parathesia in RLE Coordination Gross Motor Movements are Fluid and Coordinated: No Fine Motor Movements are Fluid and Coordinated: No Coordination and Movement Description: mild dysmetria on the RUE Heel Shin Test: mild decreased speed on the compared to teh  L Motor  Motor Motor: Hemiplegia;Ataxia Motor - Discharge Observations: mild hemilpegia  on the RUE>LE with improved coordination Mobility  Bed Mobility Bed Mobility: Rolling Right;Rolling Left;Sit to Supine;Supine to Sit Rolling Right: Independent with assistive device Rolling Left: Independent with assistive device Supine to Sit: Independent with assistive device Sit to Supine: Independent with assistive device Transfers Sit to Stand: Supervision/Verbal cueing  Trunk/Postural Assessment  Cervical Assessment Cervical Assessment: Within Functional Limits Thoracic Assessment Thoracic Assessment: Within Functional Limits Lumbar Assessment Lumbar Assessment: Within Functional Limits Postural Control Postural Control: Within Functional Limits  Balance Standardized Balance Assessment Standardized Balance Assessment: Berg Balance Test Berg Balance Test Sit to Stand: Able to stand  independently using hands Standing Unsupported: Able to stand safely 2 minutes Sitting with Back Unsupported but Feet Supported on Floor or Stool: Able to sit safely and securely 2 minutes Stand to Sit: Controls descent by using hands Transfers: Able to transfer safely, definite need of hands Standing Unsupported with Eyes Closed: Able to stand 10 seconds with supervision Standing Ubsupported with Feet Together: Able to place feet together independently and stand 1 minute safely From Standing, Reach Forward with Outstretched Arm: Can reach forward >12 cm safely (5") From Standing Position, Pick up Object from Floor: Able to pick up shoe, needs supervision From Standing Position, Turn to Look Behind Over each Shoulder: Looks behind one side only/other side shows less weight shift Turn 360 Degrees: Able to turn 360 degrees safely but slowly Standing Unsupported, Alternately Place Feet on Step/Stool: Able to complete >2 steps/needs minimal assist Standing Unsupported, One Foot in Front: Able to plae foot ahead  of the other independently and hold 30 seconds Standing on One Leg: Tries to lift leg/unable to hold 3 seconds but remains standing independently Total Score: 40 Dynamic Sitting Balance Dynamic Sitting - Balance Support: No upper extremity supported;During functional activity Dynamic Sitting - Level of Assistance: 6: Modified independent (Device/Increase time) Dynamic Standing Balance Dynamic Standing - Balance Support: No upper extremity supported;During functional activity Dynamic Standing - Level of Assistance: 5: Stand by assistance Dynamic Standing - Balance Activities: Lateral lean/weight shifting;Forward lean/weight shifting;Reaching for objects Extremity/Trunk Assessment RUE Assessment Active Range of Motion (AROM) Comments: WFL General Strength Comments: 3-/5 throughout. Needs cuing to attend and coordinate R UE LUE Assessment LUE Assessment: Within Functional Limits   [name] 06/02/2019, 11:36 AM

## 2019-06-02 NOTE — Progress Notes (Signed)
Team Conference Report to Patient/Family  Team Conference discussion was reviewed with the patient, including goals, any changes in plan of care and target discharge date.  Patient expressed understanding and is in agreement.  The patient has a target discharge date of 06/03/19. Equipment ordered for discharge on Friday.  Ruben Moore B 06/02/2019, 12:15 PM

## 2019-06-03 ENCOUNTER — Telehealth: Payer: Self-pay

## 2019-06-03 LAB — GLUCOSE, CAPILLARY
Glucose-Capillary: 100 mg/dL — ABNORMAL HIGH (ref 70–99)
Glucose-Capillary: 111 mg/dL — ABNORMAL HIGH (ref 70–99)

## 2019-06-03 LAB — CREATININE, SERUM
Creatinine, Ser: 1.3 mg/dL — ABNORMAL HIGH (ref 0.61–1.24)
GFR calc Af Amer: >60 mL/min (ref >60)
GFR calc non Af Amer: 54 mL/min — ABNORMAL LOW (ref >60)

## 2019-06-03 NOTE — Progress Notes (Deleted)
Recreational Therapy Session Note  Patient Details  Name: Jeromy Belmonte MRN: [id number] Date of Birth: [birth year] Today's Date: 06/03/2019 Late Entry for 06/02/2019 Pain: no c/o Skilled Therapeutic Interventions/Progress Updates: Session focused on discharge planning and leisure community resources. Discussion and of local resources provided and handout with contact information and program details provided.   Therapy/Group: Individual Therapy Bobbijo Holst 06/03/2019, 10:53 AM

## 2019-06-03 NOTE — Progress Notes (Signed)
[facility] PHYSICAL MEDICINE & REHABILITATION PROGRESS NOTE   Subjective/Complaints:   No new issues overnite, discussed post d/c recs   ROS- denies abd pain, , SOB  Objective:   No results found. No results for input(s): WBC, HGB, HCT, PLT in the last 72 hours. Recent Labs    06/03/19 0507  CREATININE 1.30*    Intake/Output Summary (Last 24 hours) at 06/03/2019 0743 Last data filed at 06/03/2019 0311 Gross per 24 hour  Intake 1120 ml  Output 300 ml  Net 820 ml     Physical Exam: Vital Signs Blood pressure 112/61, pulse 79, temperature 98.2 F (36.8 C), temperature source Oral, resp. rate 17, height 6\' 3"  (1.905 m), weight 74.1 kg, SpO2 97 %. Vitals reviewed. Constitutional: aphasic; did say "no when asked if any issues". ; but was appropriate, NAD  HENT:  Head: Normocephalic and atraumatic.  Eyes: EOM are normal. Right eye exhibits no discharge. Left eye exhibits no discharge.  Neck: No tracheal deviation present. No thyromegaly present.  Respiratory: Effort normal. No stridor. No respiratory distress. Lungs clear Heart- RRR no ubs murmur or ES  GI: Soft. He exhibits no distension. No epigastric tenderness  Musculoskeletal:     Comments: there is left parasternal and  lateral rib tenderness in mid axillary line just inferior to axilla   Neurological: He is alert and oriented to person, place, and time.  Patient is alert in no acute distress.    Dysarthria, word finding difficulties.  Fair awareness of his deficits. Right facial weakness Motor: Right upper extremity: 3+/5 proximal distal Right lower extremity: 4-4+/5 proximal distal- pt requires some cues to activate Left upper extremity/left lower extremity: 5/5 proximal distal Expressive aphasia. Difficulty with naming tasks.  Skin: Skin is warm and dry. Bleeding from small area in right lower abdomen; dressing applied. Minimal drainage at this time.  Psychiatric: He has a normal mood and affect. His behavior is  normal.     Assessment/Plan: 1. 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  Stable for D/C today F/u PCP in 3-4 weeks F/u PM&R 2 weeks F/u Neuro in 2 mo See D/C summary See D/C instructions Care Tool:  Bathing  Bathing activity did not occur: Refused Body parts bathed by patient: Right arm, Left arm, Chest, Abdomen, Front perineal area, Buttocks, Right upper leg, Left upper leg, Right lower leg, Left lower leg, Face         Bathing assist Assist Level: Supervision/Verbal cueing     Upper Body Dressing/Undressing Upper body dressing   What is the patient wearing?: Pull over shirt    Upper body assist Assist Level: Supervision/Verbal cueing    Lower Body Dressing/Undressing Lower body dressing      What is the patient wearing?: Pants, Incontinence brief     Lower body assist Assist for lower body dressing: Supervision/Verbal cueing     Toileting Toileting Toileting Activity did not occur (Clothing management and hygiene only): Refused  Toileting assist Assist for toileting: Supervision/Verbal cueing     Transfers Chair/bed transfer  Transfers assist     Chair/bed transfer assist level: Independent with assistive device Chair/bed transfer assistive device: Museum/gallery exhibitions officer assist      Assist level: Supervision/Verbal cueing Assistive device: Walker-rolling Max distance: 150   Walk 10 feet activity   Assist     Assist level: Supervision/Verbal cueing Assistive device: Walker-rolling   Walk 50 feet activity   Assist Walk 50 feet with 2 turns  activity did not occur: Safety/medical concerns  Assist level: Supervision/Verbal cueing Assistive device: Walker-rolling    Walk 150 feet activity   Assist Walk 150 feet activity did not occur: Safety/medical concerns  Assist level: Supervision/Verbal cueing Assistive device: Walker-rolling    Walk 10 feet on uneven surface  activity   Assist Walk 10  feet on uneven surfaces activity did not occur: Safety/medical concerns   Assist level: Supervision/Verbal cueing Assistive device: Aeronautical engineer Will patient use wheelchair at discharge?: No Type of Wheelchair: Manual    Wheelchair assist level: Supervision/Verbal cueing Max wheelchair distance: 150    Wheelchair 50 feet with 2 turns activity    Assist        Assist Level: Supervision/Verbal cueing   Wheelchair 150 feet activity     Assist      Assist Level: Supervision/Verbal cueing   Blood pressure 112/61, pulse 79, temperature 98.2 F (36.8 C), temperature source Oral, resp. rate 17, height 6\' 3"  (1.905 m), weight 74.1 kg, SpO2 97 %.  Medical Problem List and Plan: 1.  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       Continue CIR PT, OT, SLP  ELOS 2/12 2.  Antithrombotics: -DVT/anticoagulation: Lovenox             -antiplatelet therapy: Aspirin 325 mg daily and Plavix 75 mg daily x3 months and aspirin alone 3. Pain Management: Tylenol as needed  Intermittent Left sided chest/rib pain, pain increased with taking a deep breath, pt is in no distress appears comfortable, no exacerbation while in therapy , unremarkable  CXR and EKG , monitor- exam is consistent with rib contusion- improved with  Lidocaine patch  Normal ECHO 1/26 EKG    Borderline ST elevation in lateral leads-  Unchanged vs prior trace 4. Mood: Provide emotional support              -antipsychotic agents: N/A 5. Neuropsych: This patient is ?fully capable of making decisions on his own behalf. 6. Skin/Wound Care: Routine skin checks  .  7. Fluids/Electrolytes/Nutrition: Routine in and outs.  8.  History of tobacco abuse as well as marijuana.  Provide counseling 9.  Hyperlipidemia.  Cont Lipitor.  10.  Diabetes mellitus.  Hemoglobin A1c 5.4.  SSI.  Patient  on no medications prior to admission             Monitor with increased mobility.  11.  3.3 cm right thyroid nodule.  Follow-up as outpatient with ultrasound 12. Bradycardia: asymptomatic NSR on EKG 2/2  2/12: well controlled. No meds Vitals:   06/02/19 1920 06/03/19 0310  BP: (!) 108/57 112/61  Pulse: 65 79  Resp: 18 17  Temp: 98.8 F (37.1 C) 98.2 F (36.8 C)  SpO2: 100% 97%    13.  Constipation increased Senna S  to 2 po BID - 2 stools yesterday Daily BMs , continent   LOS: 14 days A FACE TO FACE EVALUATION WAS PERFORMED  Charlett Blake 06/03/2019, 7:43 AM

## 2019-06-03 NOTE — Progress Notes (Signed)
Recreational Therapy Discharge Summary Patient Details  Name: Ruben Moore MRN: [id number] Date of Birth: 1945/05/23 Today's Date: 06/03/2019  Long term goals set: 1  Long term goals met: 1  Comments on progress toward goals: Pt referred for TR services for an emphasis on community reintegration.  Pt participated in simulated community outing ambulating & negotiating obstacles with RW in the hospital gift shop and on outside uneven surfaces with supervision.  Pt is set for discharge home today.  Goal met.  Reasons for discharge: discharge from hospital  Patient/family agrees with progress made and goals achieved: Yes  [name] 06/03/2019, 12:17 PM

## 2019-06-03 NOTE — Telephone Encounter (Signed)
Ruben Moore from [name] called and wants to delay start of care PT OT SLP until 02.15.21.  She also wants to know who [name] is overseen by:  I called and left voicemail regarding who [name] works for and that I would document her delay of care request her number if questions [phone] ext [phone].  [name] employed 20+ years unclear why he is unknown to them.

## 2019-06-03 NOTE — Plan of Care (Signed)
  Problem: RH BOWEL ELIMINATION Goal: RH STG MANAGE BOWEL WITH ASSISTANCE Description: STG Manage Bowel with mod I Assistance. Outcome: Completed/Met Goal: RH STG MANAGE BOWEL W/MEDICATION W/ASSISTANCE Description: STG Manage Bowel with Medication with Assistance. Min Outcome: Completed/Met   Problem: RH BLADDER ELIMINATION Goal: RH STG MANAGE BLADDER WITH ASSISTANCE Description: STG Manage Bladder With min Assistance Outcome: Completed/Met   Problem: RH SKIN INTEGRITY Goal: RH STG SKIN FREE OF INFECTION/BREAKDOWN Description: Skin remains free of breakdown and infection - mod I Outcome: Completed/Met Goal: RH STG MAINTAIN SKIN INTEGRITY WITH ASSISTANCE Description: STG Maintain Skin Integrity With Assistance. Mod I Outcome: Completed/Met   Problem: RH SAFETY Goal: RH STG ADHERE TO SAFETY PRECAUTIONS W/ASSISTANCE/DEVICE Description: STG Adhere to Safety Precautions With Assistance/Device. Mod I Outcome: Completed/Met Goal: RH STG DECREASED RISK OF FALL WITH ASSISTANCE Description: STG Decreased Risk of Fall With Assistance. Mod I Outcome: Completed/Met   Problem: RH PAIN MANAGEMENT Goal: RH STG PAIN MANAGED AT OR BELOW PT'S PAIN GOAL Description: No pain or less than 3 Outcome: Completed/Met

## 2019-06-03 NOTE — Care Management (Signed)
Patient ID: Ruben Moore, male   DOB: October 07, 1945, 74 y.o.   MRN: [id number] [place] for [facility] regarding equipment/DME delivery prior to discharge. Confirming delivery of DME to hospital before discharge or home after discharge. Awaiting return call for clarification.

## 2019-06-03 NOTE — Progress Notes (Signed)
Patient discharging home with his daughter. They were notified that home health would contact them and his equipment would be delivered to his house today. Discharge instructions discussed with the provider. Patient left with his belongings.

## 2019-06-03 NOTE — Discharge Instructions (Signed)
Inpatient Rehab Discharge Instructions  Ruben Moore Discharge date and time: No discharge date for patient encounter.   Activities/Precautions/ Functional Status: Activity: activity as tolerated Diet: diabetic diet Wound Care: none needed Functional status:  ___ No restrictions     ___ Walk up steps independently ___ 24/7 supervision/assistance   ___ Walk up steps with assistance ___ Intermittent supervision/assistance  ___ Bathe/dress independently ___ Walk with walker     _x__ Bathe/dress with assistance ___ Walk Independently    ___ Shower independently ___ Walk with assistance    ___ Shower with assistance ___ No alcohol     ___ Return to work/school ________  COMMUNITY REFERRALS UPON DISCHARGE:  Home Health: PT, OT, ST  Agency:[name]  Phone:[phone]  Medical Equipment/Items Ordered:RW, 3 n1 commode, tub transfer bench  Agency/Supplier:[facility] [id number] [phone]  Special Instructions: No driving smoking or alcohol  Aspirin 325 mg daily and Plavix 75 mg daily x3 months then aspirin alone   STROKE/TIA DISCHARGE INSTRUCTIONS SMOKING Cigarette smoking nearly doubles your risk of having a stroke & is the single most alterable risk factor  If you smoke or have smoked in the last 12 months, you are advised to quit smoking for your health.  Most of the excess cardiovascular risk related to smoking disappears within a year of stopping.  Ask you doctor about anti-smoking medications   Quit Line: 1-800-QUIT NOW  Free Smoking Cessation Classes [phone]  CHOLESTEROL Know your levels; limit fat & cholesterol in your diet  Lipid Panel     Component Value Date/Time   CHOL 136 05/17/2019 0612   TRIG 110 05/17/2019 0612   HDL 29 (L) 05/17/2019 0612   CHOLHDL 4.7 05/17/2019 0612   VLDL 22 05/17/2019 0612   LDLCALC 85 05/17/2019 0612      Many patients benefit from treatment even if their cholesterol is at  goal.  Goal: Total Cholesterol (CHOL) less than 160  Goal:  Triglycerides (TRIG) less than 150  Goal:  HDL greater than 40  Goal:  LDL (LDLCALC) less than 100   BLOOD PRESSURE American Stroke Association blood pressure target is less that 120/80 mm/Hg  Your discharge blood pressure is:  BP: 127/65  Monitor your blood pressure  Limit your salt and alcohol intake  Many individuals will require more than one medication for high blood pressure  DIABETES (A1c is a blood sugar average for last 3 months) Goal HGBA1c is under 7% (HBGA1c is blood sugar average for last 3 months)  Diabetes:   Lab Results  Component Value Date   HGBA1C 5.4 05/17/2019     Your HGBA1c can be lowered with medications, healthy diet, and exercise.  Check your blood sugar as directed by your physician  Call your physician if you experience unexplained or low blood sugars.  PHYSICAL ACTIVITY/REHABILITATION Goal is 30 minutes at least 4 days per week  Activity: Increase activity slowly, Therapies: Physical Therapy: Home Health Return to work:   Activity decreases your risk of heart attack and stroke and makes your heart stronger.  It helps control your weight and blood pressure; helps you relax and can improve your mood.  Participate in a regular exercise program.  Talk with your doctor about the best form of exercise for you (dancing, walking, swimming, cycling).  DIET/WEIGHT Goal is to maintain a healthy weight  Your discharge diet is:  Diet Order            Diet heart healthy/carb modified Room  service appropriate? Yes with Assist; Fluid consistency: Thin  Diet effective now              liquids Your height is:  Height: 6\' 3"  (190.5 cm) Your current weight is: Weight: 73.5 kg Your Body Mass Index (BMI) is:  BMI (Calculated): 20.25  Following the type of diet specifically designed for you will help prevent another stroke.  Your goal weight range is:    Your goal Body Mass Index (BMI) is  19-24.  Healthy food habits can help reduce 3 risk factors for stroke:  High cholesterol, hypertension, and excess weight.  RESOURCES Stroke/Support Group:  Call [phone]   STROKE EDUCATION PROVIDED/REVIEWED AND GIVEN TO PATIENT Stroke warning signs and symptoms How to activate emergency medical system (call 911). Medications prescribed at discharge. Need for follow-up after discharge. Personal risk factors for stroke. Pneumonia vaccine given:  Flu vaccine given:  My questions have been answered, the writing is legible, and I understand these instructions.  I will adhere to these goals & educational materials that have been provided to me after my discharge from the hospital.     My questions have been answered and I understand these instructions. I will adhere to these goals and the provided educational materials after my discharge from the hospital.  Patient/Caregiver Signature _______________________________ Date __________  Clinician Signature _______________________________________ Date __________  Please bring this form and your medication list with you to all your follow-up doctor's appointments.

## 2019-06-06 NOTE — Telephone Encounter (Signed)
Medical record reviewed. Social work note reviewed.  Verbal orders to delay of care given per office protocol. [name] advised that Dr. Letta Pate is the physician assigned to patient.

## 2019-06-07 ENCOUNTER — Telehealth: Payer: Self-pay | Admitting: *Deleted

## 2019-06-07 ENCOUNTER — Telehealth: Payer: Self-pay | Admitting: Registered Nurse

## 2019-06-07 NOTE — Care Management (Signed)
Patient ID: Ruben Moore, male   DOB: [birth year], 74 y.o.   MRN: [id number] Received a call from the daughter noting the equipment was not yet delivered from the [facility]. Contacted [employer] [employer] SW to review request for equipment was not met and no equipment was delivered since request sent in and initial delivery day of 06/02/19, Thursday. Was told that the order [id number] had been placed with the vendor and was to be delivered today. At 1530; the equipment had not been delivered to the patient's home. Marlin noted order placed and vendor noted delivery today. Followed up with the vendor [facility]) and noted equipment would not be delivered today but would be delivered to the patient's home tomorrow by 3p. Asked Katie @ [phone];  to contact the patient and explain the delivery process. I have notified the patient's daughter of the change in delivery date and vendor did contact the patient and let them know as well.

## 2019-06-07 NOTE — Telephone Encounter (Signed)
Transitional Care call Transitional Questions answered by daughter  Ruben Moore  Patient name: Ruben Moore  DOB: 11/20/1945 1. Are you/is patient experiencing any problems since coming home? No a. Are there any questions regarding any aspect of care? No 2. Are there any questions regarding medications administration/dosing? No a. Are meds being taken as prescribed? All except MOV, she will pick up the vitamins from the [facility] she reports.  b. "Patient should review meds with caller to confirm" Medication List Reviewed.  3. Have there been any falls? Yes,  A near miss fall this morning, he was going to the bathroom lost his footing and landed against the tub. The daughter reports he did not hit his head or fall to the floor. The therapist was at the home today and assessed [name], he was instructed to allow his daughter to assist him into the bathroom, he verbalizes understanding.  4. Has Home Health been to the house and/or have they contacted you? Yes, [name].  a. If not, have you tried to contact them? NA b. Can we help you contact them? NA 5. Are bowels and bladder emptying properly? Yes a. Are there any unexpected incontinence issues? No b. If applicable, is patient following bowel/bladder programs? NA 6. Any fevers, problems with breathing, unexpected pain? No 7. Are there any skin problems or new areas of breakdown? No 8. Has the patient/family member arranged specialty MD follow up (ie cardiology/neurology/renal/surgical/etc.)?  s. Graulich was instructed to call [facility] to schedule HFU appointment, she verbalizes understanding.  a. Can we help arrange? NA 9. Does the patient need any other services or support that we can help arrange? No 10. Are caregivers following through as expected in assisting the patient? Yes 11. Has the patient quit smoking, drinking alcohol, or using drugs as recommended? Ms. Morden states [name] is not smoking,  drinking or using illicit drugs.   Appointment date/time 06/16/2019  arrival time 2:15 for 2:30 appointment with Dr. Letta Pate. At [place]

## 2019-06-07 NOTE — Telephone Encounter (Signed)
Ruben Moore, speech therapist, [facility] left a message asking for verbal orders for HHST 1wk1, 2wk3.  Medical record reviewed. Social work note reviewed.  Verbal orders given per office protocol.

## 2019-06-10 ENCOUNTER — Telehealth: Payer: Self-pay

## 2019-06-10 NOTE — Telephone Encounter (Signed)
Flor, PT from [name] called requesting VO on HHPT 2wk3, 1wk3. Order approved and given.

## 2019-06-16 ENCOUNTER — Encounter
Payer: No Typology Code available for payment source | Attending: Physical Medicine & Rehabilitation | Admitting: Physical Medicine & Rehabilitation

## 2019-06-16 ENCOUNTER — Other Ambulatory Visit: Payer: Self-pay

## 2019-06-16 ENCOUNTER — Encounter: Payer: Self-pay | Admitting: Physical Medicine & Rehabilitation

## 2019-06-16 VITALS — BP 146/70 | HR 81 | Temp 97.9°F | Ht 72.0 in | Wt 166.0 lb

## 2019-06-16 DIAGNOSIS — I63512 Cerebral infarction due to unspecified occlusion or stenosis of left middle cerebral artery: Secondary | ICD-10-CM | POA: Diagnosis not present

## 2019-06-16 DIAGNOSIS — I69320 Aphasia following cerebral infarction: Secondary | ICD-10-CM | POA: Diagnosis not present

## 2019-06-16 DIAGNOSIS — G8191 Hemiplegia, unspecified affecting right dominant side: Secondary | ICD-10-CM | POA: Insufficient documentation

## 2019-06-16 NOTE — Progress Notes (Signed)
Subjective:    Patient ID: Ruben Moore, male    DOB: August 15, 1945, 74 y.o.   MRN: [id number] 74 y.o. right-handed male with history of bladder cancer, chronic left carotid occlusion followed at the [facility], diet-controlled diabetes mellitus and tobacco abuse.  History taken from chart review as patient has expressive aphasia.  On no prescription medications.  Lives alone independent prior to admission.  He is retired.  He does have a daughter in the area that works.  Presented 05/17/2019 with right side weakness and aphasia.  Noted systolic blood pressure in the 150s.  SARS coronavirus negative, potassium 6.0, troponin negative, urine drug screen positive marijuana, alcohol level negative.  Cranial CT scan showed left insular cortex left basal ganglia corona radiata infarction likely acute to subacute.  No midline shift.  Patient did not receive TPA.  MRI of the brain showed patchy multifocal acute to early subacute ischemia left MCA territory.  MRA of the head occlusion of the left ICA to the level of the terminus.  CTA of head and neck left ICA occlusion at its origin.  No significant intracranial stenosis.  Incidental finding of a 3.3 right thyroid nodule recommending thyroid ultrasound as outpatient.  Echocardiogram with ejection fraction of 65% without emboli.  Neurology services consulted maintain on aspirin 325 mg daily and Plavix 75 mg daily for 3 months then aspirin alone.  Subcutaneous Lovenox for DVT prophylaxis.  Tolerating a regular diet.  Therapy evaluations completed and patient was admitted for a comprehensive rehab program  During patient's stay in rehab weekly team conferences were held to monitor patient's progress, set goals and discuss barriers to discharge. At admission, patient required moderate assist ambulate 150 feet rolling walker, minimal assist sit to stand, moderate assist upper body bathing mod assist lower body bathing mod assist upper body dressing max is lower body  dressing +2 physical assist toilet transfers  He/has had improvement in functional use RUE/LUE  and RLE/LLE as well as improvement in awareness.  Ambulatory transfers to toilet without assistive device and supervision assist.  He is able to complete urination standing at toilet with distant supervision.  Gait training and simulated community environment rolling walker 200 feet x 2 without assistive device.  Supervision assist overall with several small loss of balance noted.  Patient also performed gait training over cemented sidewalk with rolling walker.  Supervision tub transfers.  Gather his belongings for activities day living homemaking dressing grooming and hygiene.  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  He also has a residual aphasia.  He is accompanied by his daughter who drove him here today.  She indicates that PT OT and speech are providing services in the home environment.  He has a follow-up appointment with the [facility] his primary doctor tomorrow. Patient has not followed up with neurologist Dr. Thus far. No falls at home The patient is able to dress himself except for needing some help with his shoes He is able to get to the bathroom by himself using a walker. He needs help with cooking He does not drive Orders for home health speech therapy were placed after the patient was discharged from the hospital by this office.  Transitional care call was performed Patient's daughter had several questions about cause of stroke. The patient states he had  he has quit smoking  pain Inventory Average Pain 0 Pain Right Now 0 My pain is no pain  In the last 24 hours, has pain interfered with the following? General activity 0 Relation with others  0 Enjoyment of life 0 What TIME of day is your pain at its worst? no pain Sleep (in general) Fair  Pain is worse with: no pain Pain improves with: noi pain Relief from Meds: no pain  Mobility walk with assistance use a walker how many minutes can you walk? 2 ability to climb steps?  yes do you drive?  no transfers alone  Function not employed: date last employed . disabled: date disabled . I need assistance with the following:  dressing, meal prep and household duties  Neuro/Psych weakness numbness trouble walking dizziness confusion  Prior Studies transitional  Physicians involved in your care transitional   Family History  Problem Relation Age of Onset  . Diabetes Brother    Social History   Socioeconomic History  . Marital status: Single    Spouse name: Not on file  . Number of children: Not on file  . Years of education: Not on file  . Highest education level: Not on file  Occupational History  . Not on file  Tobacco Use  . Smoking status: Former Research scientist (life sciences)  . Smokeless tobacco: Former Network engineer and Sexual Activity  . Alcohol use: Not Currently  . Drug use: Yes    Types: Marijuana  . Sexual activity: Not on file  Other Topics Concern  . Not on file  Social History Narrative  . Not on file   Social Determinants of Health   Financial Resource Strain:   . Difficulty of Paying Living Expenses: Not on file  Food Insecurity:   . Worried About Charity fundraiser in the Last Year: Not on file  . Ran Out of Food in the Last Year: Not on file  Transportation Needs:   . Lack of Transportation (Medical): Not on file  . Lack of Transportation (Non-Medical): Not on file  Physical Activity:   . Days of Exercise per Week: Not on file  . Minutes of Exercise per Session: Not on file  Stress:   . Feeling of Stress : Not on file  Social Connections:   . Frequency of Communication with Friends and Family: Not on file  . Frequency of Social Gatherings  with Friends and Family: Not on file  . Attends Religious Services: Not on file  . Active Member of Clubs or Organizations: Not on file  . Attends Archivist Meetings: Not on file  . Marital Status: Not on file   History reviewed. No pertinent surgical history. Past Medical History:  Diagnosis Date  . Bladder cancer ([place])   . Diabetes mellitus without complication ([place])   . Stroke ([place]) 05/16/2019  . Tobacco use    Temp 97.9 F (36.6 C)   Ht 6' (1.829 m)   Wt 166 lb (75.3 kg)   BMI 22.51 kg/m   Opioid Risk Score:   Fall Risk Score:  `1  Depression screen PHQ 2/9  No flowsheet data found.   Review of Systems  Constitutional: Positive for unexpected weight change.  HENT: Negative.   Eyes: Negative.   Respiratory: Positive for shortness of breath.   Gastrointestinal: Negative.   Endocrine: Negative.   Musculoskeletal: Positive for gait problem.  Skin: Negative.   Neurological: Positive for dizziness, weakness and numbness.  Psychiatric/Behavioral: Positive for confusion.  Objective:   Physical Exam Vitals and nursing note reviewed.  Constitutional:      Appearance: Normal appearance.  HENT:     Head: Normocephalic and atraumatic.  Eyes:     Extraocular Movements: Extraocular movements intact.     Conjunctiva/sclera: Conjunctivae normal.     Pupils: Pupils are equal, round, and reactive to light.  Cardiovascular:     Rate and Rhythm: Normal rate and regular rhythm.     Heart sounds: Normal heart sounds. No murmur.  Pulmonary:     Effort: Pulmonary effort is normal.     Breath sounds: Normal breath sounds. No stridor. No wheezing or rhonchi.  Abdominal:     General: Abdomen is flat. Bowel sounds are normal.     Palpations: Abdomen is soft.  Musculoskeletal:     Cervical back: Normal range of motion.  Neurological:     Mental Status: He is alert.     Comments: Motor strength is 4/5 in the right deltoid bicep tricep finger flexors and  extensors as well as right hip flexor knee extensor ankle dorsiflexor Tone is normal in the right upper and right lower limb The patient has decreased fine motor right finger thumb opposition positive dysdiadochokinesis with rapid alternating supination pronation of the right upper extremity.  Psychiatric:        Mood and Affect: Mood normal.     Comments: Has a mild expressive aphasia which limits verbal responses           Assessment & Plan:  1 left MCA distribution infarct with right hemiparesis and aphasia likely due to combination of hyperlipidemia and smoking as well as diabetes.  He does have a history of left carotid occlusion. We did have to get the home health speech therapy and physical therapy orders placed  after discharge from the hospital We discussed risk factor modification. Recommend follow-up with neurology at the [facility] Continue home health PT OT speech and once this finishes will need to get some outpatient therapy at the [place].  We discussed doing therapy here in [place] however financially it would be preferable to do it at the [place]. Physical medicine rehabilitation follow-up on as-needed basis 2.  Hyperlipidemia continue Lipitor follow-up with PCP at the [place] #3.  History of diabetes but did not have elevated blood sugars in the hospital which required medications.  He will [facility] with [facility].

## 2019-06-16 NOTE — Patient Instructions (Addendum)
Please ask your [facility] MD if he can refer you to a [place] neurologist   Please ask your [facility] MD to set you up with outpatient PT, OT, Speech once home health therapy is finished   No driving unless cleared by Neurologist

## 2019-07-01 DIAGNOSIS — E119 Type 2 diabetes mellitus without complications: Secondary | ICD-10-CM | POA: Diagnosis not present

## 2019-07-01 DIAGNOSIS — Z7982 Long term (current) use of aspirin: Secondary | ICD-10-CM

## 2019-07-01 DIAGNOSIS — J45909 Unspecified asthma, uncomplicated: Secondary | ICD-10-CM

## 2019-07-01 DIAGNOSIS — I69322 Dysarthria following cerebral infarction: Secondary | ICD-10-CM | POA: Diagnosis not present

## 2019-07-01 DIAGNOSIS — I69354 Hemiplegia and hemiparesis following cerebral infarction affecting left non-dominant side: Secondary | ICD-10-CM | POA: Diagnosis not present

## 2019-07-01 DIAGNOSIS — F1290 Cannabis use, unspecified, uncomplicated: Secondary | ICD-10-CM

## 2019-07-01 DIAGNOSIS — I63512 Cerebral infarction due to unspecified occlusion or stenosis of left middle cerebral artery: Secondary | ICD-10-CM

## 2019-07-01 DIAGNOSIS — I69320 Aphasia following cerebral infarction: Secondary | ICD-10-CM | POA: Diagnosis not present

## 2019-07-01 DIAGNOSIS — Z8551 Personal history of malignant neoplasm of bladder: Secondary | ICD-10-CM

## 2019-07-01 DIAGNOSIS — K5900 Constipation, unspecified: Secondary | ICD-10-CM

## 2019-07-01 DIAGNOSIS — Z720 Tobacco use: Secondary | ICD-10-CM

## 2019-07-01 DIAGNOSIS — E042 Nontoxic multinodular goiter: Secondary | ICD-10-CM

## 2019-07-01 DIAGNOSIS — Z9181 History of falling: Secondary | ICD-10-CM

## 2019-07-01 DIAGNOSIS — Z7902 Long term (current) use of antithrombotics/antiplatelets: Secondary | ICD-10-CM

## 2019-07-01 DIAGNOSIS — E785 Hyperlipidemia, unspecified: Secondary | ICD-10-CM

## 2019-07-05 ENCOUNTER — Telehealth: Payer: Self-pay

## 2019-07-05 NOTE — Telephone Encounter (Signed)
Ruben Moore, SLP from [name] called requesting continuous HHST 1wk4. Orders approved and given.

## 2019-07-11 ENCOUNTER — Telehealth: Payer: Self-pay

## 2019-07-11 NOTE — Telephone Encounter (Signed)
Clair Gulling, OT with [name] called requesting verbal orders to extend therapy to 1wk4 and requested to put orders in for Outpt therapy for the week of April 18th. Orders approved and given for extend, need Outpt orders.

## 2019-07-14 ENCOUNTER — Telehealth: Payer: Self-pay

## 2019-07-14 NOTE — Telephone Encounter (Signed)
Daughter called asking if Dr. Letta Pate put an order in for pt Outpt therapy. A message has been sent to Dr. Letta Pate to put an order in.

## 2019-07-25 ENCOUNTER — Telehealth: Payer: Self-pay

## 2019-07-25 NOTE — Telephone Encounter (Signed)
Need VO for SW for patient. Orders approved and given.

## 2019-08-05 ENCOUNTER — Telehealth: Payer: Self-pay

## 2019-08-05 NOTE — Telephone Encounter (Signed)
Margaret, CRNM from [name] called requesting [place] 2wk2, 2wk3, 1e2wks until the end of episode. I called Joycelyn Schmid back to confirm because last telephone message daughter called to request Outpt therapy. Spoke to [name] and patient and daughter had a falling out and patient has no transportation. Also [name] is sending out a SW to help with community resources for patient. Patient did have a fall but is refusing PT at the time. Orders approved and given.

## 2019-08-18 ENCOUNTER — Telehealth: Payer: Self-pay | Admitting: *Deleted

## 2019-08-18 DIAGNOSIS — E119 Type 2 diabetes mellitus without complications: Secondary | ICD-10-CM

## 2019-08-18 DIAGNOSIS — Z720 Tobacco use: Secondary | ICD-10-CM

## 2019-08-18 DIAGNOSIS — Z8551 Personal history of malignant neoplasm of bladder: Secondary | ICD-10-CM

## 2019-08-18 DIAGNOSIS — Z9181 History of falling: Secondary | ICD-10-CM

## 2019-08-18 DIAGNOSIS — K5900 Constipation, unspecified: Secondary | ICD-10-CM

## 2019-08-18 DIAGNOSIS — M25561 Pain in right knee: Secondary | ICD-10-CM

## 2019-08-18 DIAGNOSIS — I69354 Hemiplegia and hemiparesis following cerebral infarction affecting left non-dominant side: Secondary | ICD-10-CM | POA: Diagnosis not present

## 2019-08-18 DIAGNOSIS — I69320 Aphasia following cerebral infarction: Secondary | ICD-10-CM | POA: Diagnosis not present

## 2019-08-18 DIAGNOSIS — I69322 Dysarthria following cerebral infarction: Secondary | ICD-10-CM

## 2019-08-18 DIAGNOSIS — F1290 Cannabis use, unspecified, uncomplicated: Secondary | ICD-10-CM

## 2019-08-18 DIAGNOSIS — J45909 Unspecified asthma, uncomplicated: Secondary | ICD-10-CM

## 2019-08-18 DIAGNOSIS — Z7982 Long term (current) use of aspirin: Secondary | ICD-10-CM

## 2019-08-18 DIAGNOSIS — E042 Nontoxic multinodular goiter: Secondary | ICD-10-CM

## 2019-08-18 DIAGNOSIS — Z7902 Long term (current) use of antithrombotics/antiplatelets: Secondary | ICD-10-CM

## 2019-08-18 DIAGNOSIS — E785 Hyperlipidemia, unspecified: Secondary | ICD-10-CM

## 2019-08-18 NOTE — Telephone Encounter (Signed)
I notified Mr Bassette and [place]. He denies marijuana use. I gave him the advice from Dr Letta Pate.

## 2019-08-18 NOTE — Telephone Encounter (Signed)
Rick OT w/ [name] called to report that Ruben Moore had a fall this morning, did not report injury but has not been "feeling himself and having difficulty reading on the Internet. Please advise.

## 2019-08-18 NOTE — Telephone Encounter (Signed)
Make sure he has not taken up smoking marijuana again.  If this is not the case he needs to call his PCP as he has several medical conditions that can cause feeling out of sorts.  He also needs a follow-up with neurology which he has not done as of my last visit in February.

## 2019-08-19 ENCOUNTER — Telehealth: Payer: Self-pay | Admitting: Physical Medicine & Rehabilitation

## 2019-08-19 NOTE — Telephone Encounter (Signed)
Ruben Moore with [name] would like to eval and treat the patient for falls, fall prevention gait and balance issues.  Please call her at [phone] (before 5pm on 08/19/19) she will be on vacation next week and if we call back then speak to Ruben Moore at the same above number.  Patient doesn't have assistance to transition to outpatient therapy and they would like to continue to see patient.

## 2019-08-19 NOTE — Telephone Encounter (Signed)
Orders approved and given. [place] notified.

## 2019-08-23 ENCOUNTER — Telehealth: Payer: Self-pay

## 2019-08-23 NOTE — Telephone Encounter (Signed)
Gracie, PT from [name] called requesting verbal orders for HHPT 2wk5/ 1wk1 for balance, gait training and strength. Orders approved and given.

## 2019-09-02 ENCOUNTER — Telehealth: Payer: Self-pay | Admitting: *Deleted

## 2019-09-02 NOTE — Telephone Encounter (Signed)
Ruben Moore from [facility] called requesting to continue ST. Approval given.

## 2019-09-30 ENCOUNTER — Telehealth: Payer: Self-pay | Admitting: Physical Medicine & Rehabilitation

## 2019-09-30 NOTE — Telephone Encounter (Signed)
Gracie PT with [facility] needs to get verbal orders to see patient 2w4; and 1w5.  Please call at [phone].

## 2019-09-30 NOTE — Telephone Encounter (Signed)
Ruben Moore has been notified.

## 2019-09-30 NOTE — Telephone Encounter (Signed)
Patient not seen since February. Is this order okay?

## 2019-09-30 NOTE — Telephone Encounter (Signed)
This should either be deferred to patient's PCP (I see he receives care at the [facility]) or patient needs to have appointment here first to determine if he needs these services. Thank you!

## 2020-03-07 ENCOUNTER — Encounter: Payer: Self-pay | Admitting: Neurology

## 2020-05-17 NOTE — Progress Notes (Deleted)
NEUROLOGY CONSULTATION NOTE  Ruben Moore MRN: [id number] DOB: Sep 17, 1945  Referring provider: [facility] Primary care provider: [facility]  Reason for consult:  stroke   Subjective:  Ruben Moore is a 75 year old ***-handed male with diabetes, tobacco use and history of bladder cancer who presents for stroke.  History supplemented by hospital records.CT, MRI and MRA from hospitalization personally reviewed.  He was admitted to [facility] in January 2021 for left sided hemispheric stroke presenting with aphasia, dysarthria and right upper and lower extremity weakness.  CT head showed infarcts involving the left insular cortex, left basal ganglia and corona radiata.  MRI of brain confirmed patchy infarcts involving the insular cortec, left basal ganglia, and left frontal and temporal lobes, as well as petechial hemorrhage at the left lentiform nucleus and abnormal flow void in the left ICA.  MRA of head and neck showed occlusion of the left ICA at the terminus and origin/bifurcation.  CTA of head and neck confirmed left ICA occlusion at the origin with intracranial reconstitution via PCOM.  2D echocardiogram showed EF 60-65%.  LDL was 85 and Hgb A1c was 5.4.  He ***.  Repeat CT head showed no change in the left MCA infarct.  He was not on antithrombotic therapy or statin prior to admission.  He was discharged on ASA 325mg  and Plavix 75mg  daily for 3 months followed by ASA alone.  He was started on Lipitor 40mg .     PAST MEDICAL HISTORY: Past Medical History:  Diagnosis Date  . Bladder cancer ([place])   . Diabetes mellitus without complication ([place])   . Stroke ([place]) 05/16/2019  . Tobacco use     PAST SURGICAL HISTORY: No past surgical history on file.  MEDICATIONS: Current Outpatient Medications on File Prior to Visit  Medication Sig Dispense Refill  . acetaminophen (TYLENOL) 325 MG tablet Take 2 tablets (650 mg total) by mouth every 4 (four) hours as  needed for mild pain (or temp > 37.5 C (99.5 F)).    Marland Kitchen aspirin EC 325 MG EC tablet Take 1 tablet (325 mg total) by mouth daily. 30 tablet 0  . atorvastatin (LIPITOR) 40 MG tablet Take 1 tablet (40 mg total) by mouth daily at 6 PM. 30 tablet 1  . clopidogrel (PLAVIX) 75 MG tablet Take 1 tablet (75 mg total) by mouth daily. 30 tablet 1  . lidocaine (LIDODERM) 5 % Place 1 patch onto the skin daily. Remove & Discard patch within 12 hours or as directed by MD 30 patch 0  . Multiple Vitamin (MULTIVITAMIN WITH MINERALS) TABS tablet Take 1 tablet by mouth daily.    Marland Kitchen senna-docusate (SENOKOT-S) 8.6-50 MG tablet Take 2 tablets by mouth 2 (two) times daily.     No current facility-administered medications on file prior to visit.    ALLERGIES: No Known Allergies  FAMILY HISTORY: Family History  Problem Relation Age of Onset  . Diabetes Brother     SOCIAL HISTORY: Social History   Socioeconomic History  . Marital status: Single    Spouse name: Not on file  . Number of children: Not on file  . Years of education: Not on file  . Highest education level: Not on file  Occupational History  . Not on file  Tobacco Use  . Smoking status: Former Research scientist (life sciences)  . Smokeless tobacco: Former Network engineer and Sexual Activity  . Alcohol use: Not Currently  . Drug use: Yes    Types: Marijuana  .  Sexual activity: Not on file  Other Topics Concern  . Not on file  Social History Narrative  . Not on file   Social Determinants of Health   Financial Resource Strain: Not on file  Food Insecurity: Not on file  Transportation Needs: Not on file  Physical Activity: Not on file  Stress: Not on file  Social Connections: Not on file  Intimate Partner Violence: Not on file    Objective:  *** General: No acute distress.  Patient appears well-groomed.   Head:  Normocephalic/atraumatic Eyes:  fundi examined but not visualized Neck: supple, no paraspinal tenderness, full Moore of motion Back: No  paraspinal tenderness Heart: regular rate and rhythm Lungs: Clear to auscultation bilaterally. Vascular: No carotid bruits. Neurological Exam: Mental status: alert and oriented to person, place, and time, recent and remote memory intact, fund of knowledge intact, attention and concentration intact, speech fluent and not dysarthric, language intact. Cranial nerves: CN I: not tested CN II: pupils equal, round and reactive to light, visual fields intact CN III, IV, VI:  full Moore of motion, no nystagmus, no ptosis CN V: facial sensation intact. CN VII: upper and lower face symmetric CN VIII: hearing intact CN IX, X: gag intact, uvula midline CN XI: sternocleidomastoid and trapezius muscles intact CN XII: tongue midline Bulk & Tone: normal, no fasciculations. Motor:  muscle strength 5/5 throughout Sensation:  Pinprick, temperature and vibratory sensation intact. Deep Tendon Reflexes:  2+ throughout,  toes downgoing.   Finger to nose testing:  Without dysmetria.   Heel to shin:  Without dysmetria.   Gait:  Normal station and stride.  Romberg negative.  Assessment/Plan:   1.  Residual right sided hemiparesis secondary to cerebrovascular accident. 2.  Left MCA territory infarct secondary to left ICA occlusion 3.  Type 2 diabetes mellitus 4.  Hyperlipidemia 5.  Tobacco use disorder  1. Secondary stroke prevention as managed by PCP: -  ASA 325mg  daily -  Statin therapy.  LDL goal less than 70 -  Blood pressure management.  Systolic blood pressure goal should be 130-150 to ensure adequate cerebral perfusion in light of the ICA occlusion. -  Glycemic control.  Hgb A1c goal less than 7. 2.  ***    Thank you for allowing me to take part in the care of this patient.  Metta Clines, DO  CC:  [facility]

## 2020-05-18 ENCOUNTER — Ambulatory Visit: Payer: Non-veteran care | Admitting: Neurology

## 2021-10-19 IMAGING — MR MR HEAD W/O CM
14 of 15 series · 37 of 48 positions shown · IV contrast (gadavist)
Comparison: Prior head CT from earlier the same day.

CLINICAL DATA: Initial evaluation for acute stroke, right-sided
weakness, dysarthria.

EXAM:
MRI HEAD WITHOUT CONTRAST
MRA HEAD WITHOUT CONTRAST
MRA NECK WITHOUT AND WITH CONTRAST
TECHNIQUE: Multiplanar, multiecho pulse sequences of the brain and surrounding
structures were obtained without intravenous contrast. Angiographic
images of the Circle of Willis were obtained using MRA technique
without intravenous contrast. Angiographic images of the neck were
obtained using MRA technique without and with intravenous contrast.
Carotid stenosis measurements (when applicable) are obtained
utilizing NASCET criteria, using the distal internal carotid
diameter as the denominator.
CONTRAST:  7.2mL GADAVIST GADOBUTROL 1 MMOL/ML IV SOLN

[Series 5: DWI · axial · 3.0mm · 0.88mm/px · z∈[-58,+79]mm · 6 of 94 slices shown (1 of 4)]
[im 1/94]
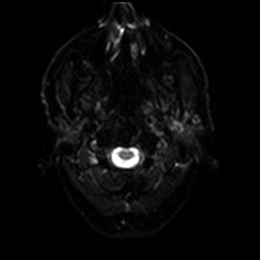
[im 19/94]
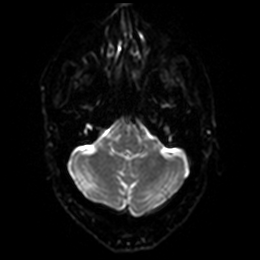
[im 38/94]
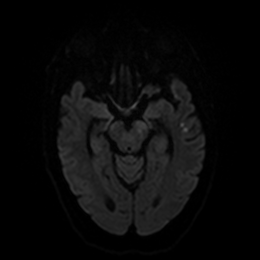
[im 56/94]
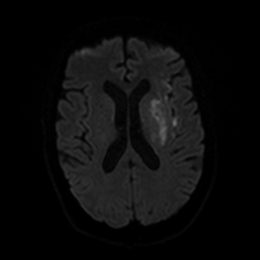
[im 75/94]
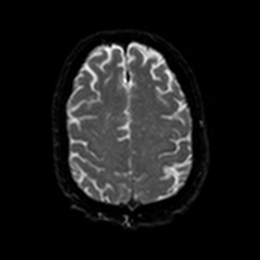
[im 94/94]
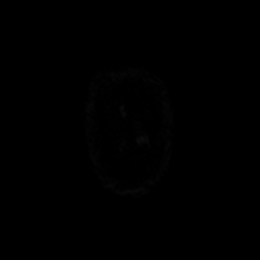

[Series 6: DWI · axial · 3.0mm · 0.88mm/px · z∈[-58,+79]mm · 2 of 47 slices shown (2 of 4)]
[im 1/47]
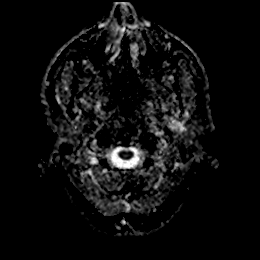
[im 47/47]
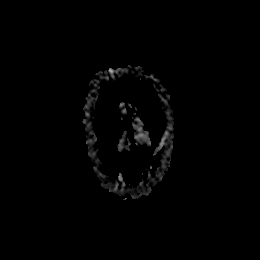

[Series 11: DWI · coronal · 4.0mm · 0.88mm/px · 4 of 72 slices shown (3 of 4)]
[im 1/72]
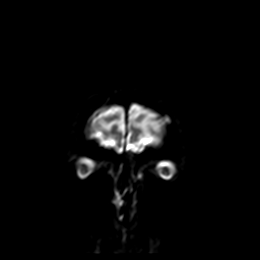
[im 24/72]
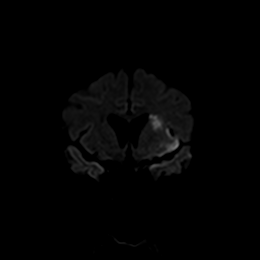
[im 48/72]
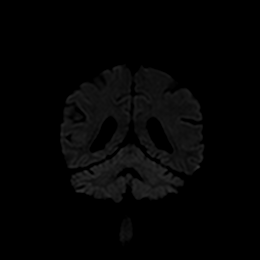
[im 72/72]
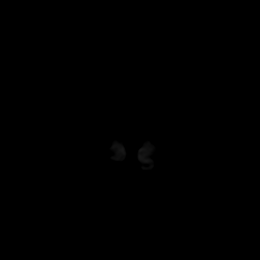

[Series 12: DWI · coronal · 4.0mm · 0.88mm/px · 2 of 36 slices shown (4 of 4)]
[im 1/36]
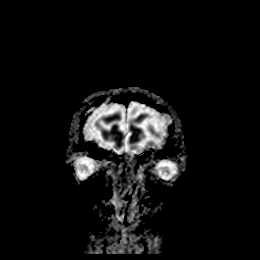
[im 36/36]
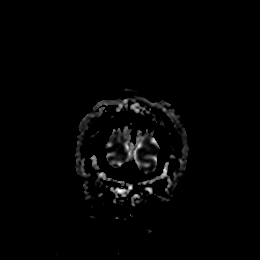

[Series 13: T1 · sagittal · 5.0mm · 0.75mm/px · 1 of 24 slices shown]
[im 1/24]
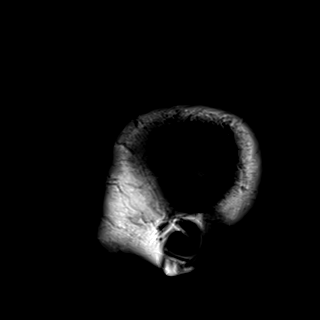

[Series 14: T2 · axial · 5.0mm · 0.72mm/px · 1 of 25 slices shown (1 of 2)]
[im 1/25]
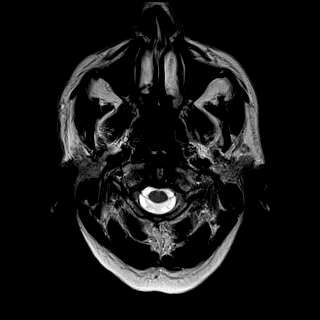

[Series 15: FLAIR · axial · 5.0mm · 0.45mm/px · 1 of 25 slices shown (1 of 3)]
[im 1/25]
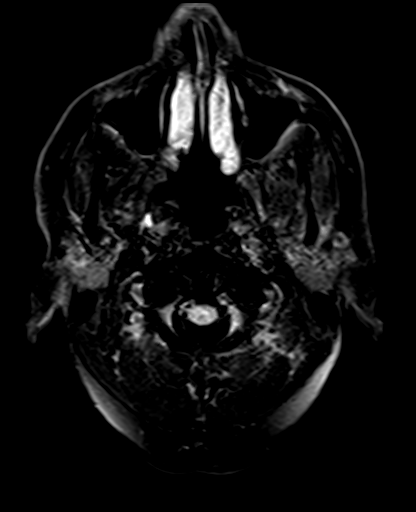

[Series 16: T2 · coronal · 5.0mm · 0.72mm/px · 1 of 30 slices shown (2 of 2)]
[im 1/30]
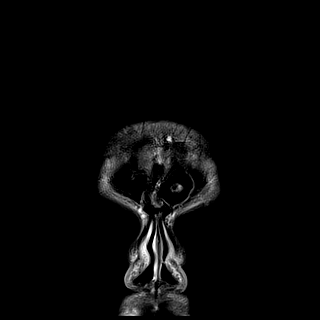

[Series 17: FLAIR · axial · 5.0mm · 0.90mm/px · 1 of 25 slices shown (2 of 3)]
[im 1/25]
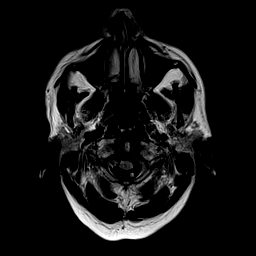

[Series 18: ax hemo · axial · 5.0mm · 0.86mm/px · 1 of 25 slices shown]
[im 1/25]
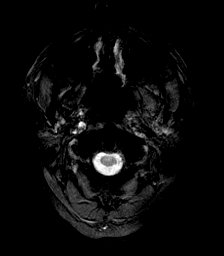

[Series 19: FLAIR · axial · 5.0mm · 0.90mm/px · 1 of 25 slices shown (3 of 3)]
[im 1/25]
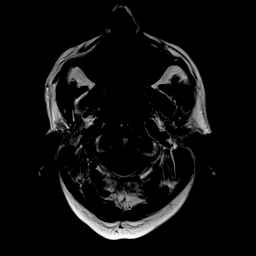

[Series 26: tof_fl3d_tra_iso · axial · 0.6mm · 0.52mm/px · z∈[-178,-43]mm · 8 of 226 slices shown]
[im 1/226]
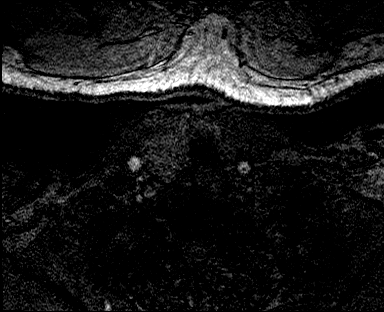
[im 46/226]
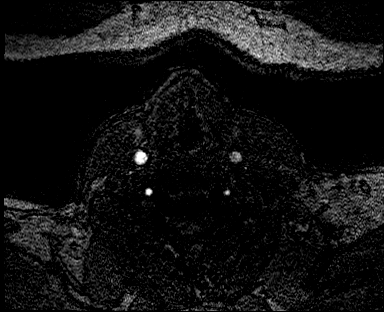
[im 68/226]
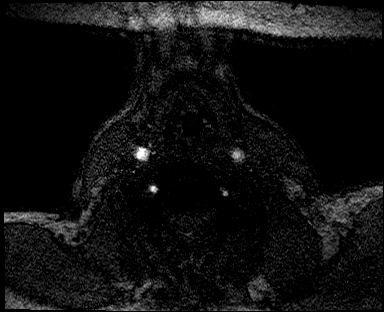
[im 91/226]
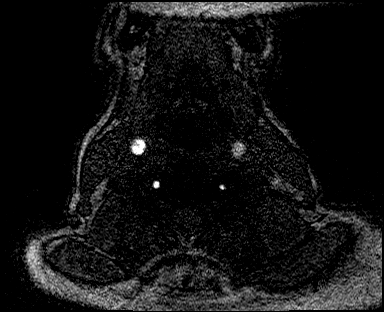
[im 136/226]
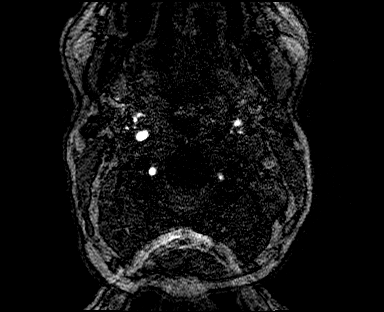
[im 158/226]
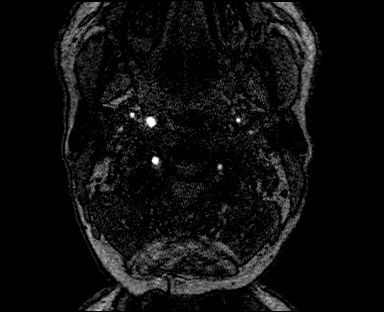
[im 181/226]
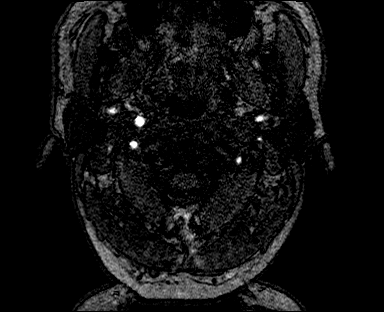
[im 226/226]
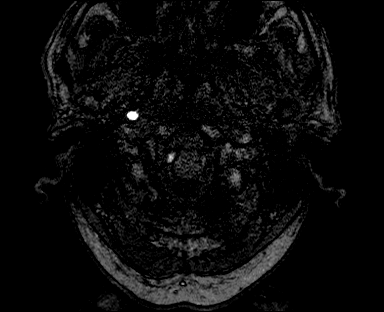

[Series 33: angio_fl3d_cor_post_ttc=3.0s_moco-adv_sub · coronal · 0.9mm · 0.85mm/px · 4 of 79 slices shown (1 of 2)]
[im 1/79]
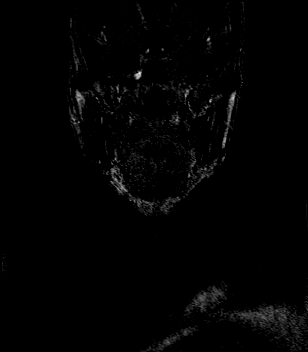
[im 27/79]
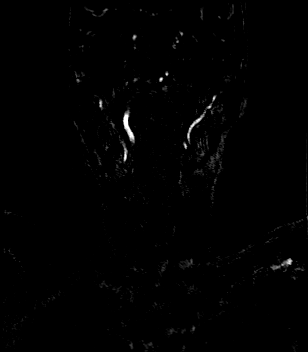
[im 53/79]
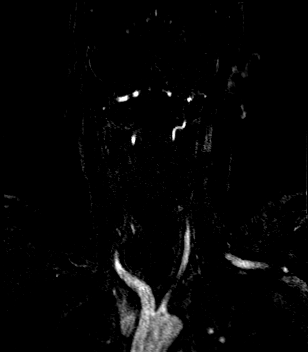
[im 79/79]
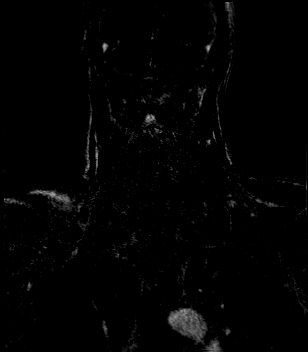

[Series 37: angio_fl3d_cor_post_ttc=3.0s_moco-adv_sub · coronal · 0.9mm · 0.85mm/px · 4 of 80 slices shown (2 of 2)]
[im 1/80]
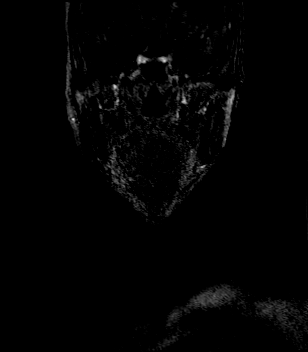
[im 27/80]
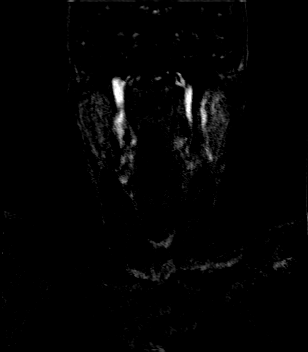
[im 53/80]
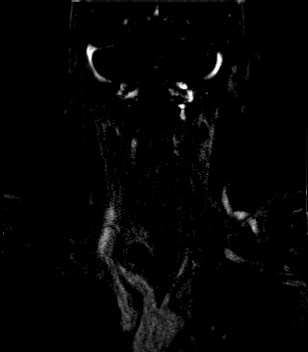
[im 80/80]
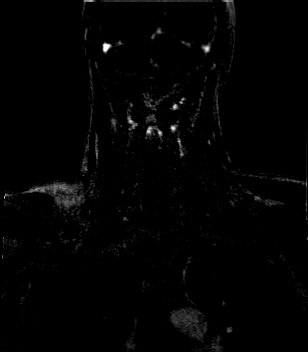

[37 of 48 positions shown; findings below may reference images not displayed]

FINDINGS: MRI HEAD FINDINGS

Brain: Examination mildly degraded by motion artifact.

Diffuse prominence of the CSF containing spaces compatible with
generalized age-related cerebral atrophy. Patchy T2/FLAIR
hyperintensity within the periventricular deep white matter both
cerebral hemispheres most consistent with chronic small vessel
ischemic disease, mild for age.

Multifocal areas of restricted diffusion involving the left insular
cortex, left basal ganglia, with a few additional patchy cortical
infarcts within the overlying left frontal and temporal lobes,
consistent with left MCA territory infarcts. These are acute to
early subacute in appearance with associated T2/FLAIR signal
abnormality. Associated petechial hemorrhage at the level of the
left lentiform nucleus without frank hemorrhagic transformation.
Diffusion abnormality/edema extending into the left cerebral
peduncle and midbrain indicative of involvement of the underlying
corticospinal tract. No significant mass effect. No other evidence
for acute or subacute ischemia. Gray-white matter differentiation
otherwise maintained. No encephalomalacia to suggest chronic
cortical infarction elsewhere within the brain. No other foci of
susceptibility artifact to suggest acute or chronic intracranial
hemorrhage.

No mass lesion, midline shift or mass effect. No hydrocephalus. No
extra-axial fluid collection. Pituitary gland within normal limits.
Midline structures intact.

Vascular: Loss of normal flow void within the left ICA to the level
of the terminus, which could be related to slow flow and/or
occlusion. Major intracranial vascular flow voids otherwise
maintained.

Skull and upper cervical spine: Craniocervical junction within
normal limits. Bone marrow signal intensity normal. No scalp soft
tissue abnormality.

Sinuses/Orbits: Globes and orbital soft tissues within normal
limits. Mild scattered mucosal thickening noted within the ethmoidal
sinuses. Small left maxillary sinus retention cyst noted. No
significant mastoid effusion. Inner ear structures grossly normal.

Other: None.

MRA HEAD FINDINGS

ANTERIOR CIRCULATION:

Examination moderately degraded by motion artifact.

Left ICA is occluded to the level of the terminus. Distal cervical
segment of the right ICA widely patent with antegrade flow. Petrous,
cavernous, and supraclinoid segments demonstrate atheromatous
irregularity but are patent without definite flow-limiting stenosis.
Both A1 segments widely patent. Normal anterior communicating
artery. Anterior cerebral arteries patent to their distal aspects
without stenosis.

Right M1 widely patent. Perfusion of the left M1 via collateral flow
across the circle-of-Willis and/or from the posterior circulation.
Left M1 widely patent as well. Grossly negative MCA bifurcations.
Distal MCA branches well perfused. Right MCA branches somewhat
attenuated as compared to the contralateral left.

POSTERIOR CIRCULATION:

Vertebral arteries widely patent to the vertebrobasilar junction
without stenosis. Right vertebral artery dominant. Posterior
inferior cerebral arteries patent bilaterally. Basilar widely patent
to its distal aspect without stenosis. Superior cerebral arteries
patent bilaterally. PCA supplied via the basilar as well as robust
bilateral posterior communicating arteries. Both PCAs well perfused
to their distal aspects without stenosis.

No intracranial aneurysm.

MRA NECK FINDINGS

AORTIC ARCH: Visualized aortic arch of normal caliber with normal 3
vessel morphology. No hemodynamically significant stenosis seen
about the origin of the great vessels. Visualized subclavian
arteries widely patent.

RIGHT CAROTID SYSTEM: Right common carotid artery widely patent from
its origin to the bifurcation without stenosis. No significant
atheromatous narrowing about the right bifurcation. Right ICA widely
patent distally to the skull base without stenosis or vascular
occlusion.

LEFT CAROTID SYSTEM: Left common carotid artery widely patent from
its origin to the bifurcation. There is occlusion of the left ICA at
its origin at the left carotid bifurcation. Left ICA remains
occluded to the circle-of-Willis.

VERTEBRAL ARTERIES: Both vertebral arteries arise from the
subclavian arteries. Right vertebral artery dominant and widely
patent to the skull base. Multifocal moderate to severe stenoses
seen involving the proximal left V1 segment, likely atherosclerotic
(series 2409, image 13). Left vertebral artery otherwise widely
patent distally without stenosis or vascular occlusion.

Incidental note made of a 3 cm right thyroid nodule, indeterminate.
IMPRESSION: MRI HEAD IMPRESSION:

1. Patchy multifocal acute to early subacute ischemic left MCA
territory infarcts involving the left insular cortex, left basal
ganglia, and overlying left frontal and temporal lobes as above. No
significant mass effect.
2. Associated petechial hemorrhage at the left lentiform nucleus
without frank hemorrhagic transformation.
3. Abnormal flow void within the left ICA to the level of the
terminus, consistent with occlusion. See below.
4. Underlying mild chronic microvascular ischemic disease.

MRA HEAD IMPRESSION:

1. Motion degraded exam.
2. Occlusion of the left ICA to the level of the terminus. Left MCA
and its branches are perfused via collateral flow across the
circle-of-Willis and/or from the posterior circulation.
3. Otherwise grossly negative MRA of the head. No other large vessel
occlusion. No hemodynamically significant or correctable stenosis
identified.

MRA NECK IMPRESSION:

1. Occlusion of the left ICA at its origin at the left carotid
bifurcation.
2. Wide patency of the right carotid artery system within the neck.
3. Multifocal moderate to severe proximal left V1 stenoses as above.
Left vertebral artery otherwise widely patent as is the dominant
right vertebral artery.
4. Approximate 3 cm right thyroid nodule, indeterminate. Follow-up
examination with dedicated thyroid ultrasound recommended for
further evaluation. This could be performed on a nonemergent
outpatient basis.

## 2021-10-19 IMAGING — CT CT HEAD W/O CM
3 series · 15 of 47 positions shown, 18 images · non-contrast
Comparison: None.

CLINICAL DATA: Dizziness, confusion, blurred vision

EXAM:
CT HEAD WITHOUT CONTRAST
TECHNIQUE: Contiguous axial images were obtained from the base of the skull
through the vertex without intravenous contrast.

[Series 3: head 5.0 h30s · axial · 0.47mm/px · z∈[-103,+32]mm · 9 of 33 slices shown, 12 images]
[im 3/33  brain]
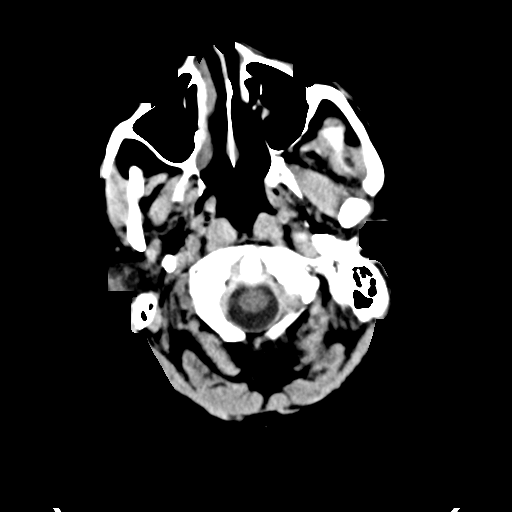
[im 3/33  bone]
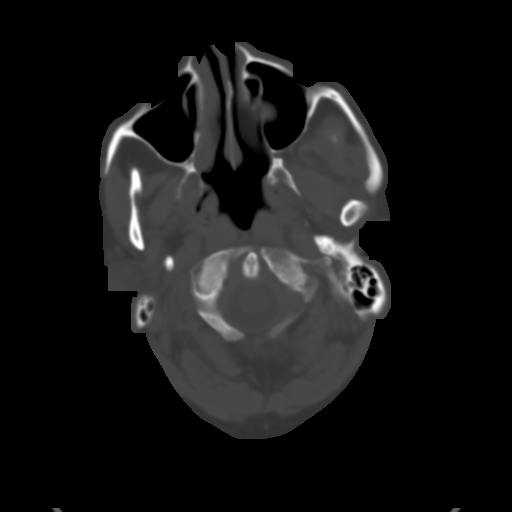
[im 6/33  brain]
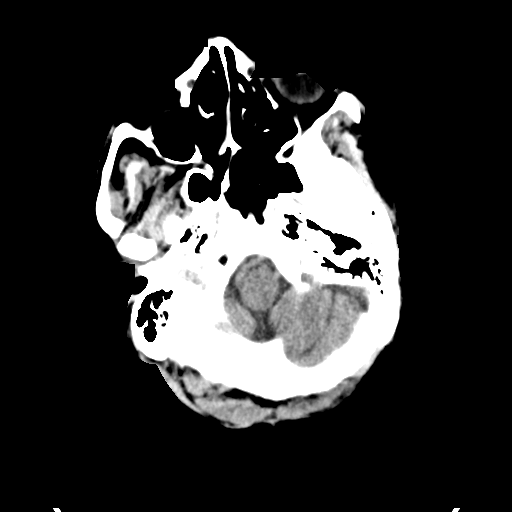
[im 9/33  brain]
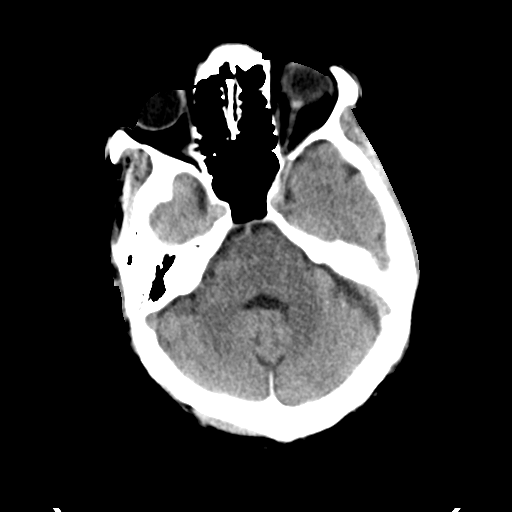
[im 13/33  brain]
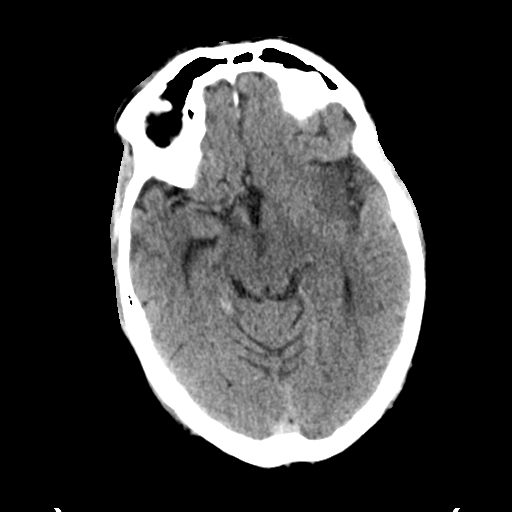
[im 17/33  brain]
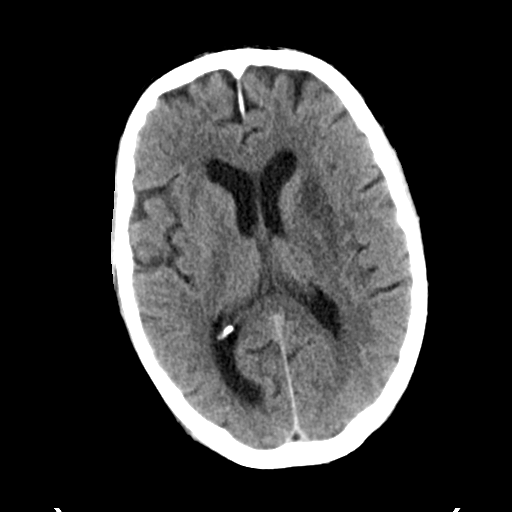
[im 17/33  bone]
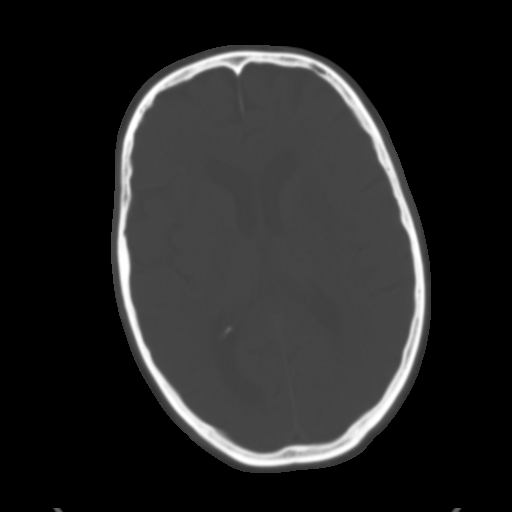
[im 20/33  brain]
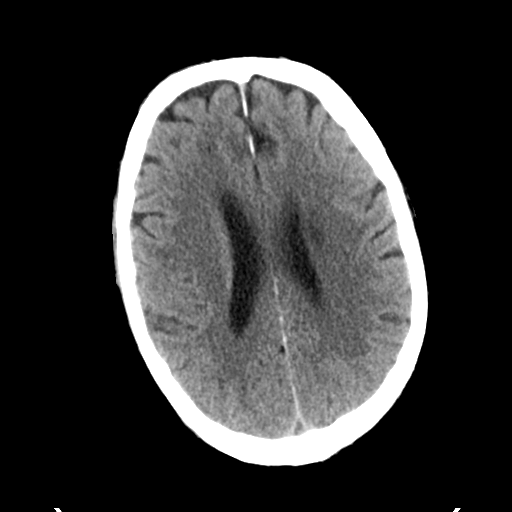
[im 24/33  brain]
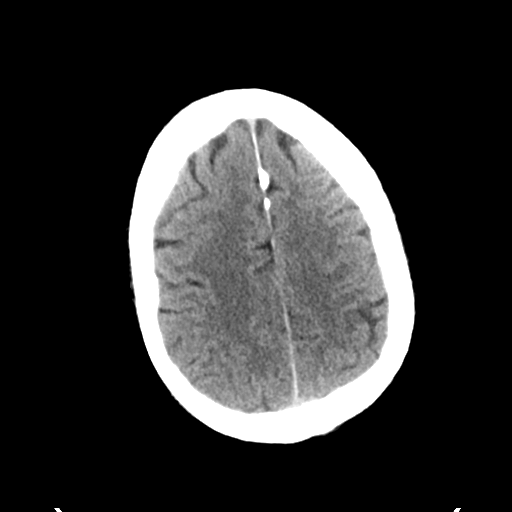
[im 27/33  brain]
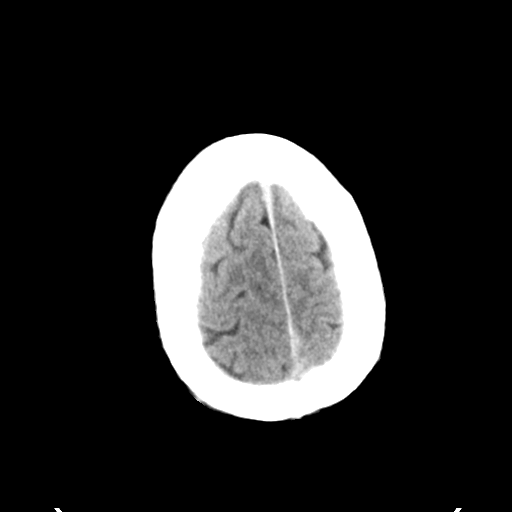
[im 30/33  brain]
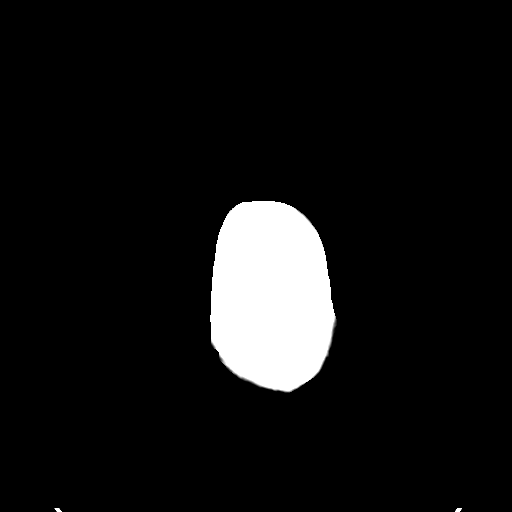
[im 30/33  bone]
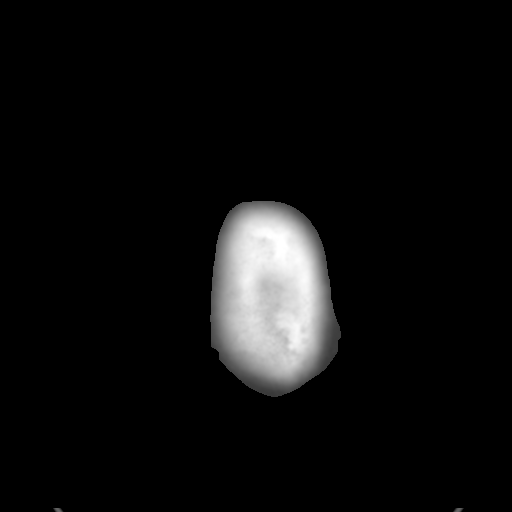

[Series 5: head 3.0 mpr cor · coronal · 0.32mm/px · 3 of 76 slices shown]
[im 26/76  brain]
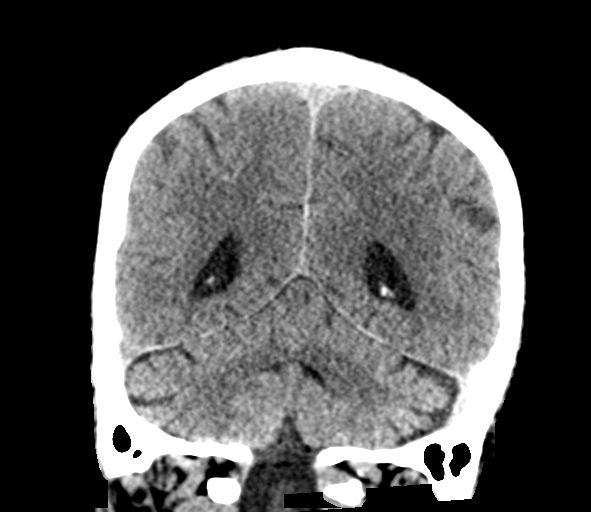
[im 34/76  brain]
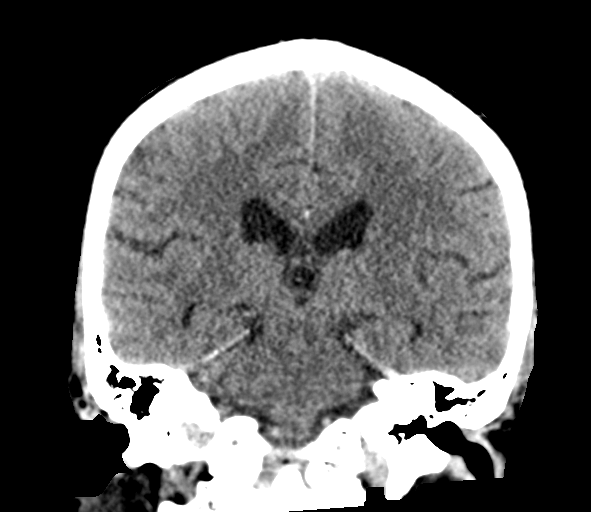
[im 42/76  brain]
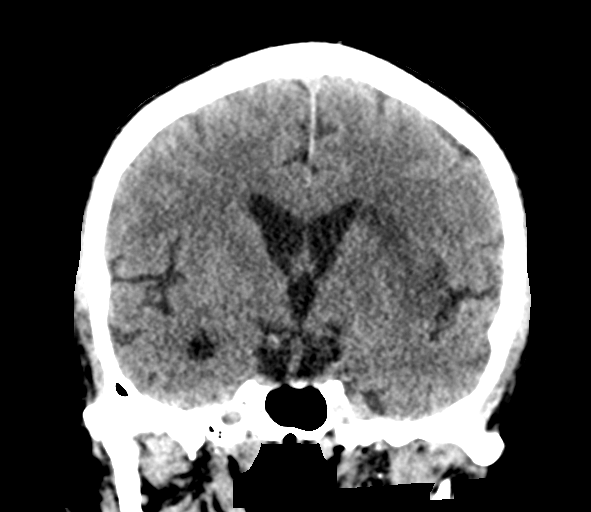

[Series 6: head 3.0 mpr sag · sagittal · 0.33mm/px · 3 of 63 slices shown]
[im 22/63  brain]
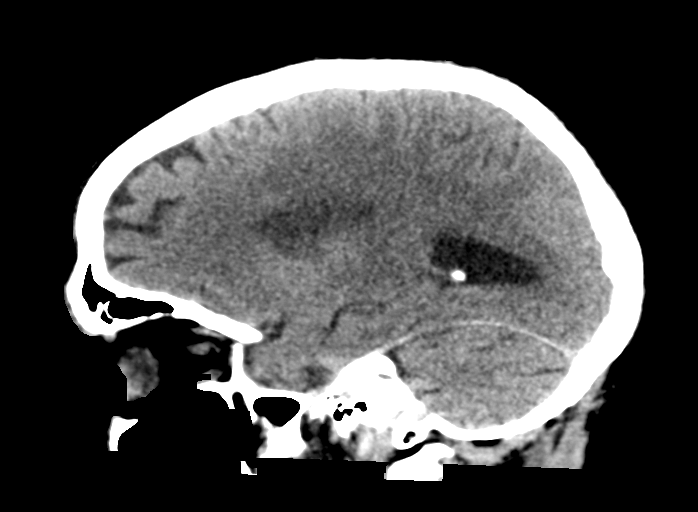
[im 32/63  brain]
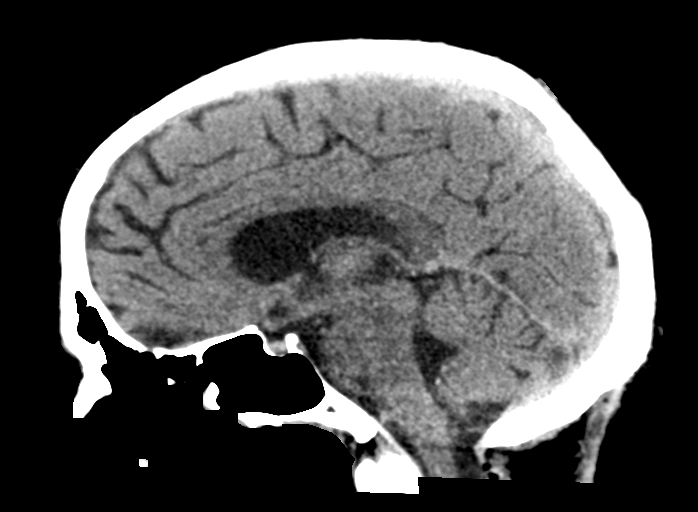
[im 41/63  brain]
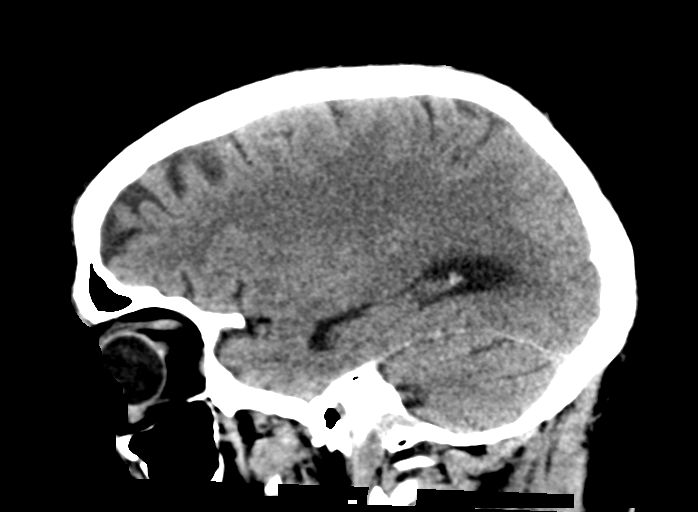

[15 of 47 positions shown; findings below may reference images not displayed]

FINDINGS: Brain: There is low-density noted in the left insular cortex,
extending into the left basal ganglia and corona radiata compatible
with acute to subacute infarct. No hemorrhage. No midline shift. No
hydrocephalus.

Vascular: No hyperdense vessel or unexpected calcification.

Skull: No acute calvarial abnormality.

Sinuses/Orbits: Visualized paranasal sinuses and mastoids clear.
Orbital soft tissues unremarkable.

Other: None
IMPRESSION: Infarct within the left insular cortex, left basal ganglia and
corona radiata, likely acute to subacute.
